# Patient Record
Sex: Female | Born: 1940 | Race: White | Hispanic: No | Smoking: Former smoker
Health system: Southern US, Community
[De-identification: ages and names within clinical notes are randomized; demographics above are authoritative.]

## PROBLEM LIST (undated history)

## (undated) DIAGNOSIS — I509 Heart failure, unspecified: Secondary | ICD-10-CM

## (undated) DIAGNOSIS — M4800 Spinal stenosis, site unspecified: Secondary | ICD-10-CM

## (undated) DIAGNOSIS — M810 Age-related osteoporosis without current pathological fracture: Secondary | ICD-10-CM

## (undated) DIAGNOSIS — M1990 Unspecified osteoarthritis, unspecified site: Secondary | ICD-10-CM

## (undated) DIAGNOSIS — H269 Unspecified cataract: Secondary | ICD-10-CM

## (undated) DIAGNOSIS — J45909 Unspecified asthma, uncomplicated: Secondary | ICD-10-CM

## (undated) DIAGNOSIS — C801 Malignant (primary) neoplasm, unspecified: Secondary | ICD-10-CM

## (undated) DIAGNOSIS — I10 Essential (primary) hypertension: Secondary | ICD-10-CM

## (undated) DIAGNOSIS — T7840XA Allergy, unspecified, initial encounter: Secondary | ICD-10-CM

## (undated) DIAGNOSIS — K219 Gastro-esophageal reflux disease without esophagitis: Secondary | ICD-10-CM

## (undated) DIAGNOSIS — E785 Hyperlipidemia, unspecified: Secondary | ICD-10-CM

## (undated) DIAGNOSIS — E871 Hypo-osmolality and hyponatremia: Secondary | ICD-10-CM

## (undated) HISTORY — DX: Allergy, unspecified, initial encounter: T78.40XA

## (undated) HISTORY — DX: Unspecified cataract: H26.9

## (undated) HISTORY — DX: Age-related osteoporosis without current pathological fracture: M81.0

## (undated) HISTORY — DX: Unspecified osteoarthritis, unspecified site: M19.90

## (undated) HISTORY — PX: JOINT REPLACEMENT: SHX530

## (undated) HISTORY — DX: Gastro-esophageal reflux disease without esophagitis: K21.9

## (undated) HISTORY — DX: Spinal stenosis, site unspecified: M48.00

## (undated) HISTORY — PX: HERNIA REPAIR: SHX51

## (undated) HISTORY — DX: Unspecified asthma, uncomplicated: J45.909

---

## 2015-09-21 HISTORY — PX: JOINT REPLACEMENT: SHX530

## 2016-08-20 HISTORY — PX: FEMUR FRACTURE SURGERY: SHX633

## 2018-04-19 DIAGNOSIS — N281 Cyst of kidney, acquired: Secondary | ICD-10-CM | POA: Insufficient documentation

## 2018-05-03 ENCOUNTER — Encounter: Payer: Self-pay | Admitting: Emergency Medicine

## 2018-05-03 ENCOUNTER — Other Ambulatory Visit: Payer: Self-pay

## 2018-05-03 ENCOUNTER — Inpatient Hospital Stay
Admission: EM | Admit: 2018-05-03 | Discharge: 2018-05-06 | DRG: 872 | Disposition: A | Discharge status: Home / Self Care | Payer: Medicare Other | Attending: Internal Medicine | Admitting: Internal Medicine

## 2018-05-03 DIAGNOSIS — N179 Acute kidney failure, unspecified: Secondary | ICD-10-CM | POA: Diagnosis present

## 2018-05-03 DIAGNOSIS — I110 Hypertensive heart disease with heart failure: Secondary | ICD-10-CM | POA: Diagnosis present

## 2018-05-03 DIAGNOSIS — N390 Urinary tract infection, site not specified: Secondary | ICD-10-CM | POA: Diagnosis not present

## 2018-05-03 DIAGNOSIS — Z66 Do not resuscitate: Secondary | ICD-10-CM | POA: Diagnosis present

## 2018-05-03 DIAGNOSIS — Z7982 Long term (current) use of aspirin: Secondary | ICD-10-CM

## 2018-05-03 DIAGNOSIS — Z79899 Other long term (current) drug therapy: Secondary | ICD-10-CM | POA: Diagnosis not present

## 2018-05-03 DIAGNOSIS — I5032 Chronic diastolic (congestive) heart failure: Secondary | ICD-10-CM | POA: Diagnosis present

## 2018-05-03 DIAGNOSIS — E876 Hypokalemia: Secondary | ICD-10-CM | POA: Diagnosis present

## 2018-05-03 DIAGNOSIS — A4151 Sepsis due to Escherichia coli [E. coli]: Principal | ICD-10-CM | POA: Diagnosis present

## 2018-05-03 DIAGNOSIS — N281 Cyst of kidney, acquired: Secondary | ICD-10-CM | POA: Diagnosis present

## 2018-05-03 DIAGNOSIS — Z96653 Presence of artificial knee joint, bilateral: Secondary | ICD-10-CM | POA: Diagnosis present

## 2018-05-03 DIAGNOSIS — E785 Hyperlipidemia, unspecified: Secondary | ICD-10-CM | POA: Diagnosis present

## 2018-05-03 DIAGNOSIS — A419 Sepsis, unspecified organism: Secondary | ICD-10-CM

## 2018-05-03 HISTORY — DX: Hypo-osmolality and hyponatremia: E87.1

## 2018-05-03 HISTORY — DX: Hyperlipidemia, unspecified: E78.5

## 2018-05-03 HISTORY — DX: Heart failure, unspecified: I50.9

## 2018-05-03 HISTORY — DX: Malignant (primary) neoplasm, unspecified: C80.1

## 2018-05-03 HISTORY — DX: Essential (primary) hypertension: I10

## 2018-05-03 LAB — CBC WITH DIFFERENTIAL/PLATELET
Basophils Absolute: 0 10*3/uL (ref 0–0.1)
Basophils Relative: 0 %
Eosinophils Absolute: 0 10*3/uL (ref 0–0.7)
Eosinophils Relative: 0 %
HEMATOCRIT: 29.4 % — AB (ref 35.0–47.0)
Hemoglobin: 9.9 g/dL — ABNORMAL LOW (ref 12.0–16.0)
LYMPHS ABS: 0.9 10*3/uL — AB (ref 1.0–3.6)
LYMPHS PCT: 8 %
MCH: 29.7 pg (ref 26.0–34.0)
MCHC: 33.6 g/dL (ref 32.0–36.0)
MCV: 88.4 fL (ref 80.0–100.0)
MONO ABS: 1.1 10*3/uL — AB (ref 0.2–0.9)
MONOS PCT: 10 %
NEUTROS ABS: 9 10*3/uL — AB (ref 1.4–6.5)
NEUTROS PCT: 82 %
Platelets: 336 10*3/uL (ref 150–440)
RBC: 3.32 MIL/uL — ABNORMAL LOW (ref 3.80–5.20)
RDW: 13.9 % (ref 11.5–14.5)
WBC: 11 10*3/uL (ref 3.6–11.0)

## 2018-05-03 LAB — COMPREHENSIVE METABOLIC PANEL
ALBUMIN: 3.7 g/dL (ref 3.5–5.0)
ALK PHOS: 94 U/L (ref 38–126)
ALT: 12 U/L (ref 0–44)
ANION GAP: 12 (ref 5–15)
AST: 18 U/L (ref 15–41)
BUN: 18 mg/dL (ref 8–23)
CALCIUM: 8.2 mg/dL — AB (ref 8.9–10.3)
CHLORIDE: 98 mmol/L (ref 98–111)
CO2: 23 mmol/L (ref 22–32)
Creatinine, Ser: 1.29 mg/dL — ABNORMAL HIGH (ref 0.44–1.00)
GFR calc non Af Amer: 38 mL/min — ABNORMAL LOW (ref >60)
GFR, EST AFRICAN AMERICAN: 44 mL/min — AB (ref >60)
GLUCOSE: 126 mg/dL — AB (ref 70–99)
POTASSIUM: 3.2 mmol/L — AB (ref 3.5–5.1)
SODIUM: 133 mmol/L — AB (ref 135–145)
Total Bilirubin: 0.8 mg/dL (ref 0.3–1.2)
Total Protein: 7 g/dL (ref 6.5–8.1)

## 2018-05-03 LAB — URINALYSIS, COMPLETE (UACMP) WITH MICROSCOPIC
Bilirubin Urine: NEGATIVE
GLUCOSE, UA: NEGATIVE mg/dL
KETONES UR: NEGATIVE mg/dL
NITRITE: NEGATIVE
PROTEIN: 30 mg/dL — AB
Specific Gravity, Urine: 1.015 (ref 1.005–1.030)
WBC, UA: >50 WBC/hpf — ABNORMAL HIGH (ref 0–5)
pH: 5 (ref 5.0–8.0)

## 2018-05-03 LAB — LACTIC ACID, PLASMA: LACTIC ACID, VENOUS: 1.2 mmol/L (ref 0.5–1.9)

## 2018-05-03 MED ORDER — CIPROFLOXACIN IN D5W 200 MG/100ML IV SOLN: 200.0000 mg | Freq: Two times a day (BID) | INTRAVENOUS | Status: DC

## 2018-05-03 MED ORDER — SODIUM CHLORIDE 0.9 % IV BOLUS
1000.0000 mL | Freq: Once | INTRAVENOUS | Status: AC
  Administered 2018-05-03: 1000 mL via INTRAVENOUS

## 2018-05-03 MED ORDER — ATORVASTATIN CALCIUM 20 MG PO TABS
10.0000 mg | ORAL_TABLET | Freq: Every day | ORAL | Status: DC
  Administered 2018-05-03 – 2018-05-05 (×3): 10 mg via ORAL
  Filled 2018-05-03 (×3): qty 1

## 2018-05-03 MED ORDER — SODIUM CHLORIDE 0.9 % IV SOLN
INTRAVENOUS | Status: DC
  Administered 2018-05-03: 21:00:00 via INTRAVENOUS

## 2018-05-03 MED ORDER — DOCUSATE SODIUM 100 MG PO CAPS: 100.0000 mg | ORAL_CAPSULE | Freq: Two times a day (BID) | ORAL | Status: DC | PRN

## 2018-05-03 MED ORDER — IRBESARTAN 150 MG PO TABS
150.0000 mg | ORAL_TABLET | Freq: Every day | ORAL | Status: DC
  Administered 2018-05-03 – 2018-05-06 (×4): 150 mg via ORAL
  Filled 2018-05-03 (×4): qty 1

## 2018-05-03 MED ORDER — FUROSEMIDE 40 MG PO TABS: 20.0000 mg | ORAL_TABLET | Freq: Every day | ORAL | Status: DC

## 2018-05-03 MED ORDER — HEPARIN SODIUM (PORCINE) 5000 UNIT/ML IJ SOLN
5000.0000 [IU] | Freq: Three times a day (TID) | INTRAMUSCULAR | Status: DC
  Administered 2018-05-03 – 2018-05-04 (×2): 5000 [IU] via SUBCUTANEOUS
  Filled 2018-05-03 (×2): qty 1

## 2018-05-03 MED ORDER — ASPIRIN EC 81 MG PO TBEC
81.0000 mg | DELAYED_RELEASE_TABLET | Freq: Every day | ORAL | Status: DC
  Administered 2018-05-03: 22:00:00 81 mg via ORAL
  Filled 2018-05-03 (×2): qty 1

## 2018-05-03 MED ORDER — CIPROFLOXACIN HCL 500 MG PO TABS
500.0000 mg | ORAL_TABLET | Freq: Once | ORAL | Status: AC
  Administered 2018-05-03: 500 mg via ORAL

## 2018-05-03 MED ORDER — POTASSIUM CHLORIDE CRYS ER 20 MEQ PO TBCR
20.0000 meq | EXTENDED_RELEASE_TABLET | Freq: Two times a day (BID) | ORAL | Status: DC
  Administered 2018-05-03 – 2018-05-06 (×6): 20 meq via ORAL
  Filled 2018-05-03 (×6): qty 1

## 2018-05-03 MED ORDER — ESCITALOPRAM OXALATE 10 MG PO TABS
10.0000 mg | ORAL_TABLET | ORAL | Status: DC
  Administered 2018-05-04 – 2018-05-06 (×3): 10 mg via ORAL
  Filled 2018-05-03 (×3): qty 1

## 2018-05-03 MED ORDER — CEFTRIAXONE SODIUM 1 G IJ SOLR
1.0000 g | Freq: Once | INTRAMUSCULAR | Status: AC
  Administered 2018-05-03: 1 g via INTRAVENOUS

## 2018-05-03 MED ORDER — SODIUM CHLORIDE 0.9 % IV SOLN
INTRAVENOUS | Status: AC
  Administered 2018-05-03: 1 g via INTRAVENOUS
  Filled 2018-05-03: qty 10

## 2018-05-03 MED ORDER — METOPROLOL TARTRATE 50 MG PO TABS
50.0000 mg | ORAL_TABLET | Freq: Two times a day (BID) | ORAL | Status: DC
  Administered 2018-05-03 – 2018-05-06 (×6): 50 mg via ORAL
  Filled 2018-05-03 (×6): qty 1

## 2018-05-03 MED ORDER — POTASSIUM CHLORIDE CRYS ER 20 MEQ PO TBCR
EXTENDED_RELEASE_TABLET | ORAL | Status: AC
  Filled 2018-05-03: qty 2

## 2018-05-03 MED ORDER — ACETAMINOPHEN 325 MG PO TABS
650.0000 mg | ORAL_TABLET | Freq: Four times a day (QID) | ORAL | Status: DC | PRN
  Administered 2018-05-03 – 2018-05-05 (×5): 650 mg via ORAL
  Filled 2018-05-03 (×5): qty 2

## 2018-05-03 MED ORDER — CIPROFLOXACIN HCL 500 MG PO TABS
ORAL_TABLET | ORAL | Status: AC
  Administered 2018-05-03: 500 mg via ORAL
  Filled 2018-05-03: qty 1

## 2018-05-03 MED ORDER — SODIUM CHLORIDE 1 G PO TABS
1.0000 g | ORAL_TABLET | Freq: Two times a day (BID) | ORAL | Status: DC
  Administered 2018-05-03 – 2018-05-06 (×6): 1 g via ORAL
  Filled 2018-05-03 (×7): qty 1

## 2018-05-03 MED ORDER — ONDANSETRON HCL 4 MG/2ML IJ SOLN
4.0000 mg | Freq: Once | INTRAMUSCULAR | Status: AC
  Administered 2018-05-03: 4 mg via INTRAVENOUS
  Filled 2018-05-03: qty 2

## 2018-05-03 MED ORDER — SODIUM CHLORIDE 0.9 % IV BOLUS
500.0000 mL | Freq: Once | INTRAVENOUS | Status: AC
  Administered 2018-05-03: 500 mL via INTRAVENOUS

## 2018-05-03 MED ORDER — MIRABEGRON ER 50 MG PO TB24
50.0000 mg | ORAL_TABLET | Freq: Every day | ORAL | Status: DC
  Administered 2018-05-03 – 2018-05-06 (×4): 50 mg via ORAL
  Filled 2018-05-03 (×4): qty 1

## 2018-05-03 MED ORDER — CIPROFLOXACIN HCL 250 MG PO TABS: 250.0000 mg | ORAL_TABLET | Freq: Two times a day (BID) | ORAL | 0 refills | Status: DC

## 2018-05-03 MED ORDER — CIPROFLOXACIN IN D5W 400 MG/200ML IV SOLN
400.0000 mg | Freq: Once | INTRAVENOUS | Status: DC
  Filled 2018-05-03: qty 200

## 2018-05-03 MED ORDER — POTASSIUM CHLORIDE CRYS ER 20 MEQ PO TBCR: 40.0000 meq | EXTENDED_RELEASE_TABLET | Freq: Once | ORAL | Status: DC

## 2018-05-03 MED ORDER — ONDANSETRON HCL 4 MG PO TABS: 4.0000 mg | ORAL_TABLET | Freq: Every day | ORAL | 1 refills | Status: AC | PRN

## 2018-05-03 MED ORDER — CIPROFLOXACIN IN D5W 200 MG/100ML IV SOLN
200.0000 mg | Freq: Two times a day (BID) | INTRAVENOUS | Status: DC
  Administered 2018-05-03: 200 mg via INTRAVENOUS
  Filled 2018-05-03 (×2): qty 100

## 2018-05-03 MED ORDER — PANTOPRAZOLE SODIUM 40 MG PO TBEC
40.0000 mg | DELAYED_RELEASE_TABLET | Freq: Every day | ORAL | Status: DC
  Administered 2018-05-04 – 2018-05-06 (×3): 40 mg via ORAL
  Filled 2018-05-03 (×3): qty 1

## 2018-05-03 NOTE — ED Notes (Signed)
Vanessa RN, aware of bed assigned  

## 2018-05-03 NOTE — Progress Notes (Signed)
CODE SEPSIS - PHARMACY COMMUNICATION  **Broad Spectrum Antibiotics should be administered within 1 hour of Sepsis diagnosis**  Time Code Sepsis Called/Page Received: 1443  Antibiotics Ordered: Ceftriaxone  Time of 1st antibiotic administration: 1528  Additional action taken by pharmacy:    If necessary, Name of Provider/Nurse Contacted:      Noralee Space ,PharmD Clinical Pharmacist  05/03/2018  4:35 PM

## 2018-05-03 NOTE — ED Notes (Signed)
First RN note:  Patient sent by [facility] urgent care for possible UTI.  Patient was febrile this morning and was recently released from the hospital for hyponatremia.

## 2018-05-03 NOTE — H&P (Signed)
[facility] NAME: Julie Lutz    MR#:  [id number]  DATE OF BIRTH:  [birth year]  DATE OF ADMISSION:  05/03/2018  PRIMARY CARE PHYSICIAN: System, Pcp Not In   REQUESTING/REFERRING PHYSICIAN: Quale   CHIEF COMPLAINT:   Chief Complaint  Patient presents with  . Urinary Tract Infection  . Fever    HISTORY OF PRESENT ILLNESS: Julie Lutz  is a 77 y.o. female with a known history of vocal cord cancer, CHF, hyperlipidemia, hypertension, hyponatremia-was admitted in [place] for UTI and hyponatremia for 5 days in hospital.  That was 3 weeks ago, she was discharged with 3 days course of oral Keflex.  She was feeling fine since then until yesterday.  She is visiting her daughters here in [place], flew in yesterday and also had some chills on the flight.  She also had some fever and chills today morning with some burning urinary symptoms and some cloudy urine so decided to come to emergency room. Noted to have UTI again and she was slightly tachycardic and have elevated white blood cell count so ER physician suggested to admit to hospitalist team.  PAST MEDICAL HISTORY:   Past Medical History:  Diagnosis Date  . Cancer ([place])    vocal cord  . CHF (congestive heart failure) ([place])   . Hyperlipidemia   . Hypertension   . Hyponatremia     PAST SURGICAL HISTORY:  Past Surgical History:  Procedure Laterality Date  . HERNIA REPAIR    . JOINT REPLACEMENT     bilateral knee    SOCIAL HISTORY:  Social History   Tobacco Use  . Smoking status: Never Smoker  . Smokeless tobacco: Never Used  Substance Use Topics  . Alcohol use: Yes    Comment: occasional    FAMILY HISTORY:  Family History  Problem Relation Age of Onset  . Anemia Sister     DRUG ALLERGIES: No Known Allergies  REVIEW OF SYSTEMS:   CONSTITUTIONAL: Have fever, fatigue or weakness.  EYES: No blurred or double vision.  EARS, NOSE, AND THROAT: No tinnitus or  ear pain.  RESPIRATORY: No cough, shortness of breath, wheezing or hemoptysis.  CARDIOVASCULAR: No chest pain, orthopnea, edema.  GASTROINTESTINAL: No nausea, vomiting, diarrhea or abdominal pain.  GENITOURINARY: No dysuria, hematuria.  ENDOCRINE: No polyuria, nocturia,  HEMATOLOGY: No anemia, easy bruising or bleeding. SKIN: No rash or lesion. MUSCULOSKELETAL: No joint pain or arthritis.   NEUROLOGIC: No tingling, numbness, weakness.  PSYCHIATRY: No anxiety or depression.   MEDICATIONS AT HOME:  Prior to Admission medications   Medication Sig Start Date End Date Taking? Authorizing Provider  aspirin EC 81 MG tablet Take 81 mg by mouth daily.   Yes [provider]  atorvastatin (LIPITOR) 10 MG tablet Take 10 mg by mouth at bedtime.   Yes [provider]  escitalopram (LEXAPRO) 10 MG tablet Take 10 mg by mouth every morning.   Yes [provider]  furosemide (LASIX) 20 MG tablet Take 20 mg by mouth daily.   Yes [provider]  irbesartan (AVAPRO) 150 MG tablet Take 150 mg by mouth daily.   Yes [provider]  lansoprazole (PREVACID) 30 MG capsule Take 30 mg by mouth daily.   Yes [provider]  MAGNESIUM CITRATE PO Take 1 tablet by mouth daily.   Yes [provider]  metoprolol tartrate (LOPRESSOR) 50 MG tablet Take 50 mg by mouth 2 (two) times daily.  Yes [provider]  mirabegron ER (MYRBETRIQ) 50 MG TB24 tablet Take 50 mg by mouth daily.   Yes [provider]  sodium chloride 1 g tablet Take 1 g by mouth 2 (two) times daily.   Yes [provider]  ciprofloxacin (CIPRO) 250 MG tablet Take 1 tablet (250 mg total) by mouth 2 (two) times daily for 7 days. 05/03/18 05/10/18  Delman Kitten, MD  ondansetron (ZOFRAN) 4 MG tablet Take 1 tablet (4 mg total) by mouth daily as needed for nausea or vomiting. 05/03/18 05/03/19  Delman Kitten, MD      PHYSICAL EXAMINATION:   VITAL SIGNS: Blood pressure (!)  142/94, pulse (!) 109, temperature 98.9 F (37.2 C), temperature source Oral, resp. rate 18, height 4\' 10"  (1.473 m), weight 63.5 kg, SpO2 96 %.  GENERAL:  77 y.o.-year-old patient lying in the bed with no acute distress.  EYES: Pupils equal, round, reactive to light and accommodation. No scleral icterus. Extraocular muscles intact.  HEENT: Head atraumatic, normocephalic. Oropharynx and nasopharynx clear.  NECK:  Supple, no jugular venous distention. No thyroid enlargement, no tenderness.  LUNGS: Normal breath sounds bilaterally, no wheezing, rales,rhonchi or crepitation. No use of accessory muscles of respiration.  CARDIOVASCULAR: S1, S2 tachycardia. No murmurs, rubs, or gallops.  ABDOMEN: Soft, nontender, nondistended. Bowel sounds present. No organomegaly or mass.  EXTREMITIES: No pedal edema, cyanosis, or clubbing.  NEUROLOGIC: Cranial nerves II through XII are intact. Muscle strength 5/5 in all extremities. Sensation intact. Gait not checked.  PSYCHIATRIC: The patient is alert and oriented x 3.  SKIN: No obvious rash, lesion, or ulcer.   LABORATORY PANEL:   CBC Recent Labs  Lab 05/03/18 1327  WBC 11.0  HGB 9.9*  HCT 29.4*  PLT 336  MCV 88.4  MCH 29.7  MCHC 33.6  RDW 13.9  LYMPHSABS 0.9*  MONOABS 1.1*  EOSABS 0.0  BASOSABS 0.0   ------------------------------------------------------------------------------------------------------------------  Chemistries  Recent Labs  Lab 05/03/18 1327  NA 133*  K 3.2*  CL 98  CO2 23  GLUCOSE 126*  BUN 18  CREATININE 1.29*  CALCIUM 8.2*  AST 18  ALT 12  ALKPHOS 94  BILITOT 0.8   ------------------------------------------------------------------------------------------------------------------ estimated creatinine clearance is 27.9 mL/min (A) (by C-G formula based on SCr of 1.29 mg/dL (H)). ------------------------------------------------------------------------------------------------------------------ No results for  input(s): TSH, T4TOTAL, T3FREE, THYROIDAB in the last 72 hours.  Invalid input(s): FREET3   Coagulation profile No results for input(s): INR, PROTIME in the last 168 hours. ------------------------------------------------------------------------------------------------------------------- No results for input(s): DDIMER in the last 72 hours. -------------------------------------------------------------------------------------------------------------------  Cardiac Enzymes No results for input(s): CKMB, TROPONINI, MYOGLOBIN in the last 168 hours.  Invalid input(s): CK ------------------------------------------------------------------------------------------------------------------ Invalid input(s): POCBNP  ---------------------------------------------------------------------------------------------------------------  Urinalysis    Component Value Date/Time   COLORURINE AMBER (A) 05/03/2018 1327   APPEARANCEUR TURBID (A) 05/03/2018 1327   LABSPEC 1.015 05/03/2018 1327   PHURINE 5.0 05/03/2018 1327   GLUCOSEU NEGATIVE 05/03/2018 1327   HGBUR MODERATE (A) 05/03/2018 1327   BILIRUBINUR NEGATIVE 05/03/2018 1327   KETONESUR NEGATIVE 05/03/2018 1327   PROTEINUR 30 (A) 05/03/2018 1327   NITRITE NEGATIVE 05/03/2018 1327   LEUKOCYTESUR LARGE (A) 05/03/2018 1327     RADIOLOGY: No results found.  EKG: Orders placed or performed during the hospital encounter of 05/03/18  . ED EKG 12-Lead  . ED EKG 12-Lead  . EKG 12-Lead  . EKG 12-Lead    IMPRESSION AND PLAN:  *Sepsis due to UTI IV fluid and IV antibiotics. She  was treated 3 weeks ago with cephalosporins, so I would like to treat with ciprofloxacin as advised by ID to ER physician. Cultures are sent we would like to follow the results. Also due to recurrent UTI will get ultrasound renal.  *Hypokalemia Replace oral.  *Acute renal failure We will give IV fluid and monitor renal function, we do not have previous result to  compare with.  *Hypertension Continue medications.  *Chronic diastolic congestive heart failure Patient is on diuretics, I will hold that for now.  *Hyperlipidemia Continue atorvastatin.  All the records are reviewed and case discussed with ED provider. Management plans discussed with the patient, family and they are in agreement.  CODE STATUS: DNR Advance Directive Documentation     Most Recent Value  Type of Advance Directive  Healthcare Power of Attorney, Living will  Pre-existing out of facility DNR order (yellow form or pink MOST form)  -  "MOST" Form in Place?  -       TOTAL TIME TAKING CARE OF THIS PATIENT: 45 minutes.    Vaughan Basta M.D on 05/03/2018   Between 7am to 6pm - Pager - [phone]  After 6pm go to [email or web address] - password [email or web address] [facility]  Office  [phone]  CC: Primary care physician; System, Pcp Not In   Note: This dictation was prepared with Dragon dictation along with smaller phrase technology. Any transcriptional errors that result from this process are unintentional.

## 2018-05-03 NOTE — Progress Notes (Signed)
Family Meeting Note  Advance Directive:yes  Today a meeting took place with the Patient and daughters.  The following clinical team members were present during this meeting:MD  The following were discussed:Patient's diagnosis: UTI, Htn, CHF, Hyponatremia, Hyperlipidemia, Patient's progosis: Unable to determine and Goals for treatment: DNR  Her niece, who lives in [place] is her healthcare power of attorney and patient confirms that in case of her heart stops, she would like to be DNR.  Additional follow-up to be provided: PMD Time spent during discussion:20 minutes  Julie Basta, MD

## 2018-05-03 NOTE — ED Provider Notes (Signed)
[facility] Emergency Department Provider Note ____________________________________________   First MD Initiated Contact with Patient 05/03/18 1502     (approximate)  I have reviewed the triage vital signs and the nursing notes.   HISTORY  Chief Complaint Urinary Tract Infection and Fever  HPI Julie Lutz is a 77 y.o. female history of vocal cord cancer, hypertension low-sodium, and also history of some mild congestive heart failure in the past but currently reports she is taking salt tablets and only using Lasix for the purposes of raising her sodium because she was hospitalized with a low sodium about 3 weeks ago.  She also a urinary tract infection was treated with vancomycin and Rocephin as an inpatient in [place].  About 3 days ago she began reexperiencing symptoms of fatigue, cloudiness of the urine and some uncomfortable feeling with urination.  Since she is from [place] she had not been able to seek care yet but this morning developed a fever with 102 point Fahrenheit  She was referred here from urgent care for further evaluation.  She makes very known to me that she feels fairly well at moment and that she does not wish to be hospitalized but is okay getting fluids and antibiotics but reports she will be going home today with the family with antibiotics and does not want to be hospitalized at present time.  Past Medical History:  Diagnosis Date  . Cancer ([place])    vocal cord  . CHF (congestive heart failure) ([place])   . Hyperlipidemia   . Hypertension   . Hyponatremia     There are no active problems to display for this patient.   Past Surgical History:  Procedure Laterality Date  . HERNIA REPAIR    . JOINT REPLACEMENT     bilateral knee    Prior to Admission medications   Medication Sig Start Date End Date Taking? Authorizing Provider  ciprofloxacin (CIPRO) 250 MG tablet Take 1 tablet (250 mg total) by mouth 2 (two) times daily for 7  days. 05/03/18 05/10/18  Delman Kitten, MD  ondansetron (ZOFRAN) 4 MG tablet Take 1 tablet (4 mg total) by mouth daily as needed for nausea or vomiting. 05/03/18 05/03/19  Delman Kitten, MD    Allergies Patient has no known allergies.  No family history on file.  Social History Social History   Tobacco Use  . Smoking status: Never Smoker  . Smokeless tobacco: Never Used  Substance Use Topics  . Alcohol use: Yes    Comment: occasional  . Drug use: Never    Review of Systems Constitutional: Fevers chills and fatigue eyes: No visual changes. ENT: No sore throat. Cardiovascular: Denies chest pain. Respiratory: Denies shortness of breath. Gastrointestinal: No abdominal pain.  No nausea, no vomiting.  No diarrhea.  No constipation but decreased appetite for about 2 days.  Decreased oral intake.  Genitourinary: See HPI musculoskeletal: Negative for back pain. Skin: Negative for rash. Neurological: Negative for headaches, focal weakness or numbness.    ____________________________________________   PHYSICAL EXAM:  VITAL SIGNS: ED Triage Vitals  Enc Vitals Group     BP 05/03/18 1300 99/60     Pulse Rate 05/03/18 1300 (!) 103     Resp 05/03/18 1300 20     Temp 05/03/18 1300 98.9 F (37.2 C)     Temp Source 05/03/18 1300 Oral     SpO2 05/03/18 1300 96 %     Weight 05/03/18 1302 140 lb (63.5 kg)  Height 05/03/18 1302 4\' 10"  (1.473 m)     Head Circumference --      Peak Flow --      Pain Score 05/03/18 1302 0     Pain Loc --      Pain Edu? --      Excl. in [place]? --     Constitutional: Alert and oriented. Well appearing and in no acute distress.  She and her 2 daughters both very pleasant.  Alert pleasant very conversant. Eyes: Conjunctivae are normal. Head: Atraumatic. Nose: No congestion/rhinnorhea. Mouth/Throat: Mucous membranes are lightly dry. Neck: No stridor.   Cardiovascular: Normal rate, regular rhythm. Grossly normal heart sounds.  Good peripheral  circulation. Respiratory: Normal respiratory effort.  No retractions. Lungs CTAB. Gastrointestinal: Soft and nontender. No distention. Musculoskeletal: No lower extremity tenderness nor edema. Neurologic:  Normal speech and language. No gross focal neurologic deficits are appreciated.  Skin:  Skin is warm, dry and intact. No rash noted. Psychiatric: Mood and affect are normal. Speech and behavior are normal.  ____________________________________________   LABS (all labs ordered are listed, but only abnormal results are displayed)  Labs Reviewed  COMPREHENSIVE METABOLIC PANEL - Abnormal; Notable for the following components:      Result Value   Sodium 133 (*)    Potassium 3.2 (*)    Glucose, Bld 126 (*)    Creatinine, Ser 1.29 (*)    Calcium 8.2 (*)    GFR calc non Af Amer 38 (*)    GFR calc Af Amer 44 (*)    All other components within normal limits  CBC WITH DIFFERENTIAL/PLATELET - Abnormal; Notable for the following components:   RBC 3.32 (*)    Hemoglobin 9.9 (*)    HCT 29.4 (*)    Neutro Abs 9.0 (*)    Lymphs Abs 0.9 (*)    Monocytes Absolute 1.1 (*)    All other components within normal limits  URINALYSIS, COMPLETE (UACMP) WITH MICROSCOPIC - Abnormal; Notable for the following components:   Color, Urine AMBER (*)    APPearance TURBID (*)    Hgb urine dipstick MODERATE (*)    Protein, ur 30 (*)    Leukocytes, UA LARGE (*)    WBC, UA >50 (*)    Bacteria, UA RARE (*)    All other components within normal limits  CULTURE, BLOOD (ROUTINE X 2)  CULTURE, BLOOD (ROUTINE X 2)  URINE CULTURE  LACTIC ACID, PLASMA   ____________________________________________  EKG  Reviewed and interpreted by me at 1520 Heart rate 90 QRS 80 QTc 440 No evidence of acute ischemia, single PVC ____________________________________________  RADIOLOGY   ____________________________________________   PROCEDURES  Procedure(s) performed: None  Procedures  Critical Care performed:  No  ____________________________________________   INITIAL IMPRESSION / ASSESSMENT AND PLAN / ED COURSE  Pertinent labs & imaging results that were available during my care of the patient were reviewed by me and considered in my medical decision making (see chart for details).  Patient presents for evaluation of fever fatigue chills and dysuria.  Recently hospitalized for treatment of urinary tract infection hyponatremia where she reports her sodium was about 120s.  Sodium improved today, and discussed with an attempt to obtain records from hospital in [place], but unable to obtain them at the present time.  Patient appears to have infection, urinary tract infection by labs and clinical history.  Some very slight hypotension and some very minimal tachycardia at present.  She reports she feels quite  a bit better now after she took Tylenol at urgent care and her fevers come down.  She does not appear clinically toxic, discussed with infectious disease Dr. Delaine Lame based on her previous treatment we will start her on ciprofloxacin oral as an outpatient with a plan for close follow-up in 48 hours with [facility] urgent care.  Patient is agreeable with this plan, plan to hydrate her and reassess though prior to any decision for discharge.  ----------------------------------------- 6:38 PM on 05/03/2018 -----------------------------------------  Vitals:   05/03/18 1535 05/03/18 1729  BP: (!) 138/54 (!) 142/94  Pulse: 90 (!) 109  Resp:  18  Temp:    SpO2: 96% 96%    Patient reassessed.  Still somewhat tachycardic.  Discussed with patient and her bed of family including 2 daughters at the bedside and strongly recommended to be admitted for additional care treatment of evidence of a severe infection and "sepsis" from her urinary tract infection.  Recommended treatment with IV antibiotics additional fluids and hospitalization, patient again reports that she does feel much better she is  going to go home with her family and will come back if she worsens.  After discussion of risks and benefits and shared medical decision making including her understanding that she has a severe infection that warrants hospitalization she is elected not to stay and wishes to be discharged at this time.  I will discharge her with a prescription for ciprofloxacin as recommended by infectious disease and plan to follow-up in her family and she will return to the urgent care Friday for reevaluation at [facility], return to the ER sooner if worsening symptoms or new concerns.  Although I recommended inpatient hospitalization, she appears to have the capacity and clearly understands my recommendation to be would like to be discharged instead.  She does not wish to stay for observation or ongoing ED care at this time.  We will discharge the patient, prescriptions for ciprofloxacin and Zofran.  We did discuss the risks of ciprofloxacin including tendon and joint rupture and injury, but after I discussed with infectious disease.  The most appropriate antibiotic at this time would be ciprofloxacin as advised by ID physician and the patient is understanding of this risk as well.  Return precautions and treatment recommendations and follow-up discussed with the patient who is agreeable with the plan.  He does improve improved at discharge.  She is nontoxic alert oriented ambulatory in no distress, some slight tachycardia ongoing at this time.      ____________________________________________   FINAL CLINICAL IMPRESSION(S) / ED DIAGNOSES  Final diagnoses:  Lower urinary tract infectious disease  Urinary tract infection, acute      NEW MEDICATIONS STARTED DURING THIS VISIT:  New Prescriptions   CIPROFLOXACIN (CIPRO) 250 MG TABLET    Take 1 tablet (250 mg total) by mouth 2 (two) times daily for 7 days.   ONDANSETRON (ZOFRAN) 4 MG TABLET    Take 1 tablet (4 mg total) by mouth daily as needed for nausea or  vomiting.     Note:  This document was prepared using Dragon voice recognition software and may include unintentional dictation errors.     Delman Kitten, MD 05/03/18 1840

## 2018-05-03 NOTE — ED Triage Notes (Addendum)
Patient to ED from [facility]. Patient reports that she has had fever and urinary frequency for 2-3 days. States that her urine has been cloudy as well. History of uti's. Patient also reports nausea. Denies vomiting. Patient had fever of 102.5 and HR of 120 at urgent care. Given 2 tylenol.

## 2018-05-03 NOTE — ED Notes (Signed)
Pt ambulated w/a steady gait. HR 128bpm walking; HR 119bpm at rest; BP 166/108; SpO2 100% RA.

## 2018-05-03 NOTE — ED Notes (Signed)
Pt assisted to the bathroom by her daughter.

## 2018-05-03 NOTE — ED Notes (Signed)
EDP at bedside at this time.  

## 2018-05-03 NOTE — Discharge Instructions (Signed)
You have been seen in the Emergency Department (ED) today for pain when urinating.  Your workup today suggests that you have a urinary tract infection (UTI).   Drink PLENTY of fluids.  Call your regular doctor to schedule the next available appointment to follow up on todays ED visit, or return immediately to the ED if your pain worsens, you have decreased urine production, develop fever, persistent vomiting, or other symptoms that concern you.

## 2018-05-04 ENCOUNTER — Inpatient Hospital Stay: Payer: Medicare Other

## 2018-05-04 LAB — CBC
HCT: 25.9 % — ABNORMAL LOW (ref 35.0–47.0)
HEMOGLOBIN: 9 g/dL — AB (ref 12.0–16.0)
MCH: 30.6 pg (ref 26.0–34.0)
MCHC: 34.7 g/dL (ref 32.0–36.0)
MCV: 88.3 fL (ref 80.0–100.0)
PLATELETS: 285 10*3/uL (ref 150–440)
RBC: 2.94 MIL/uL — AB (ref 3.80–5.20)
RDW: 14.1 % (ref 11.5–14.5)
WBC: 7.5 10*3/uL (ref 3.6–11.0)

## 2018-05-04 LAB — BLOOD CULTURE ID PANEL (REFLEXED)
Acinetobacter baumannii: NOT DETECTED
CANDIDA TROPICALIS: NOT DETECTED
CARBAPENEM RESISTANCE: NOT DETECTED
Candida albicans: NOT DETECTED
Candida glabrata: NOT DETECTED
Candida krusei: NOT DETECTED
Candida parapsilosis: NOT DETECTED
ENTEROCOCCUS SPECIES: NOT DETECTED
Enterobacter cloacae complex: NOT DETECTED
Enterobacteriaceae species: DETECTED — AB
Escherichia coli: DETECTED — AB
Haemophilus influenzae: NOT DETECTED
Klebsiella oxytoca: NOT DETECTED
Klebsiella pneumoniae: NOT DETECTED
LISTERIA MONOCYTOGENES: NOT DETECTED
Neisseria meningitidis: NOT DETECTED
PROTEUS SPECIES: NOT DETECTED
Pseudomonas aeruginosa: NOT DETECTED
STAPHYLOCOCCUS AUREUS BCID: NOT DETECTED
STAPHYLOCOCCUS SPECIES: NOT DETECTED
Serratia marcescens: NOT DETECTED
Streptococcus agalactiae: NOT DETECTED
Streptococcus pneumoniae: NOT DETECTED
Streptococcus pyogenes: NOT DETECTED
Streptococcus species: NOT DETECTED

## 2018-05-04 LAB — BASIC METABOLIC PANEL
ANION GAP: 9 (ref 5–15)
BUN: 15 mg/dL (ref 8–23)
CO2: 23 mmol/L (ref 22–32)
Calcium: 7.2 mg/dL — ABNORMAL LOW (ref 8.9–10.3)
Chloride: 104 mmol/L (ref 98–111)
Creatinine, Ser: 0.93 mg/dL (ref 0.44–1.00)
GFR, EST NON AFRICAN AMERICAN: 57 mL/min — AB (ref >60)
Glucose, Bld: 120 mg/dL — ABNORMAL HIGH (ref 70–99)
POTASSIUM: 3.5 mmol/L (ref 3.5–5.1)
Sodium: 136 mmol/L (ref 135–145)

## 2018-05-04 MED ORDER — MEROPENEM 1 G IV SOLR
1.0000 g | Freq: Two times a day (BID) | INTRAVENOUS | Status: DC
Start: 2018-05-04 — End: 2018-05-06
  Administered 2018-05-04 – 2018-05-05 (×4): 1 g via INTRAVENOUS
  Filled 2018-05-04 (×7): qty 1

## 2018-05-04 MED ORDER — ASPIRIN EC 81 MG PO TBEC
81.0000 mg | DELAYED_RELEASE_TABLET | Freq: Every day | ORAL | Status: DC
  Administered 2018-05-04 – 2018-05-05 (×2): 81 mg via ORAL
  Filled 2018-05-04 (×2): qty 1

## 2018-05-04 MED ORDER — ENOXAPARIN SODIUM 40 MG/0.4ML ~~LOC~~ SOLN
40.0000 mg | SUBCUTANEOUS | Status: DC
  Administered 2018-05-04 – 2018-05-05 (×2): 40 mg via SUBCUTANEOUS
  Filled 2018-05-04 (×2): qty 0.4

## 2018-05-04 MED ORDER — FUROSEMIDE 20 MG PO TABS
20.0000 mg | ORAL_TABLET | Freq: Every day | ORAL | Status: DC
  Administered 2018-05-04 – 2018-05-06 (×3): 20 mg via ORAL
  Filled 2018-05-04 (×3): qty 1

## 2018-05-04 NOTE — Progress Notes (Signed)
PHARMACY - PHYSICIAN COMMUNICATION CRITICAL VALUE ALERT - BLOOD CULTURE IDENTIFICATION (BCID)  Camika Marsico is an 77 y.o. female who presented to [facility] on 05/03/2018 with a chief complaint of UTI/fever  Assessment:  Tmax 102.8, now vitals WNL, UA leukocytes +, 1/4 GNR BCID Enterobacteriacaea E. Coli [facility] -  Name of physician (or Provider) Contacted: Arta Silence  Current antibiotics: ciprofloxacin  Changes to prescribed antibiotics recommended:  Recommendations accepted by provider Will switch to meropenem 1g IV q12h per CrCl 26 - 50 ml/min for possible ESBL E. Coli -- will de-escalate if cx/sx come back sensitive to other agents.  No results found for this or any previous visit.  Tobie Lords, PharmD, BCPS Clinical Pharmacist 05/04/2018

## 2018-05-04 NOTE — Progress Notes (Signed)
[place] [facility] [place] NAME: Zela Sobieski    MR#:  [id number]  DATE OF BIRTH:  January 06, 1941  SUBJECTIVE:  CHIEF COMPLAINT:   Chief Complaint  Patient presents with  . Urinary Tract Infection  . Fever   Has nausea and feels weak.  No trouble breathing or abdominal pain.  No dysuria.  REVIEW OF SYSTEMS:    Review of Systems  Constitutional: Positive for chills, fever and malaise/fatigue.  HENT: Negative for sore throat.   Eyes: Negative for blurred vision, double vision and pain.  Respiratory: Negative for cough, hemoptysis, shortness of breath and wheezing.   Cardiovascular: Negative for chest pain, palpitations, orthopnea and leg swelling.  Gastrointestinal: Positive for nausea. Negative for abdominal pain, constipation, diarrhea, heartburn and vomiting.  Genitourinary: Negative for dysuria and hematuria.  Musculoskeletal: Negative for back pain and joint pain.  Skin: Negative for rash.  Neurological: Negative for sensory change, speech change, focal weakness and headaches.  Endo/Heme/Allergies: Does not bruise/bleed easily.  Psychiatric/Behavioral: Negative for depression. The patient is not nervous/anxious.     DRUG ALLERGIES:  No Known Allergies  VITALS:  Blood pressure (!) 116/51, pulse 98, temperature 98.7 F (37.1 C), temperature source Oral, resp. rate 18, height 4\' 10"  (1.473 m), weight 65.1 kg, SpO2 98 %.  PHYSICAL EXAMINATION:   Physical Exam  GENERAL:  77 y.o.-year-old patient lying in the bed with no acute distress.  EYES: Pupils equal, round, reactive to light and accommodation. No scleral icterus. Extraocular muscles intact.  HEENT: Head atraumatic, normocephalic. Oropharynx and nasopharynx clear.  NECK:  Supple, no jugular venous distention. No thyroid enlargement, no tenderness.  LUNGS: Normal breath sounds bilaterally, no wheezing, rales, rhonchi. No use of accessory muscles of respiration.  CARDIOVASCULAR: S1, S2  normal. No murmurs, rubs, or gallops.  ABDOMEN: Soft, nontender, nondistended. Bowel sounds present. No organomegaly or mass.  EXTREMITIES: No cyanosis, clubbing or edema b/l.    NEUROLOGIC: Cranial nerves II through XII are intact. No focal Motor or sensory deficits b/l.   PSYCHIATRIC: The patient is alert and oriented x 3.  SKIN: No obvious rash, lesion, or ulcer.   LABORATORY PANEL:   CBC Recent Labs  Lab 05/04/18 0412  WBC 7.5  HGB 9.0*  HCT 25.9*  PLT 285   ------------------------------------------------------------------------------------------------------------------ Chemistries  Recent Labs  Lab 05/03/18 1327 05/04/18 0412  NA 133* 136  K 3.2* 3.5  CL 98 104  CO2 23 23  GLUCOSE 126* 120*  BUN 18 15  CREATININE 1.29* 0.93  CALCIUM 8.2* 7.2*  AST 18  --   ALT 12  --   ALKPHOS 94  --   BILITOT 0.8  --    ------------------------------------------------------------------------------------------------------------------  Cardiac Enzymes No results for input(s): TROPONINI in the last 168 hours. ------------------------------------------------------------------------------------------------------------------  RADIOLOGY:  US Renal  Result Date: 05/04/2018 CLINICAL DATA:  Urinary tract infection. EXAM: RENAL / URINARY TRACT ULTRASOUND COMPLETE COMPARISON:  None. FINDINGS: Right Kidney: Length: 10.1 cm. Echogenicity within normal limits. No mass or hydronephrosis visualized. Left Kidney: Length: 10.1 cm. 2.9 cm simple cyst is seen in upper pole. Echogenicity within normal limits. No mass or hydronephrosis visualized. Bladder: Appears normal for degree of bladder distention. IMPRESSION: No significant renal abnormality is noted. Electronically Signed   By: Marijo Conception, M.D.   On: 05/04/2018 09:40     ASSESSMENT AND PLAN:   *Sepsis due to UTI and E. coli bacteremia Change IV antibiotics to meropenem. Wait for final culture  and sensitivities.  Will need total 14  days of antibiotics once afebrile and normal WBC.  *Hypokalemia Replaced  *Acute kidney injury has resolved.  Renal ultrasound shows nothing acute.  *Hypertension Continue medications.   *Chronic diastolic congestive heart failure Continue oral Lasix from home.  *Hyperlipidemia Continue atorvastatin.  All the records are reviewed and case discussed with Care Management/Social Worker Management plans discussed with the patient, family and they are in agreement.  CODE STATUS: FULL CODE  DVT Prophylaxis: SCDs  TOTAL TIME TAKING CARE OF THIS PATIENT: 30 minutes.   POSSIBLE D/C IN 2-3 DAYS, DEPENDING ON CLINICAL CONDITION.  Neita Carp M.D on 05/04/2018 at 11:06 AM  Between 7am to 6pm - Pager - [phone]  After 6pm go to [email or web address] - password [email or web address] [facility]  Office  [phone]  CC: Primary care physician; System, Pcp Not In  Note: This dictation was prepared with Dragon dictation along with smaller phrase technology. Any transcriptional errors that result from this process are unintentional.

## 2018-05-05 LAB — URINE CULTURE: Special Requests: NORMAL

## 2018-05-05 NOTE — Progress Notes (Signed)
[place] at [place] NAME: Julie Lutz    MR#:  [id number]  DATE OF BIRTH:  Jan 30, 1941  SUBJECTIVE:  CHIEF COMPLAINT:   Chief Complaint  Patient presents with  . Urinary Tract Infection  . Fever   Fever 101 overnight. Afebrile today.  REVIEW OF SYSTEMS:    Review of Systems  Constitutional: Positive for chills, fever and malaise/fatigue.  HENT: Negative for sore throat.   Eyes: Negative for blurred vision, double vision and pain.  Respiratory: Negative for cough, hemoptysis, shortness of breath and wheezing.   Cardiovascular: Negative for chest pain, palpitations, orthopnea and leg swelling.  Gastrointestinal: Positive for nausea. Negative for abdominal pain, constipation, diarrhea, heartburn and vomiting.  Genitourinary: Negative for dysuria and hematuria.  Musculoskeletal: Negative for back pain and joint pain.  Skin: Negative for rash.  Neurological: Negative for sensory change, speech change, focal weakness and headaches.  Endo/Heme/Allergies: Does not bruise/bleed easily.  Psychiatric/Behavioral: Negative for depression. The patient is not nervous/anxious.     DRUG ALLERGIES:  No Known Allergies  VITALS:  Blood pressure (!) 125/57, pulse 97, temperature 99.1 F (37.3 C), temperature source Oral, resp. rate 18, height 4\' 10"  (1.473 m), weight 65.1 kg, SpO2 100 %.  PHYSICAL EXAMINATION:   Physical Exam  GENERAL:  77 y.o.-year-old patient lying in the bed with no acute distress.  EYES: Pupils equal, round, reactive to light and accommodation. No scleral icterus. Extraocular muscles intact.  HEENT: Head atraumatic, normocephalic. Oropharynx and nasopharynx clear.  NECK:  Supple, no jugular venous distention. No thyroid enlargement, no tenderness.  LUNGS: Normal breath sounds bilaterally, no wheezing, rales, rhonchi. No use of accessory muscles of respiration.  CARDIOVASCULAR: S1, S2 normal. No murmurs, rubs, or gallops.   ABDOMEN: Soft, nontender, nondistended. Bowel sounds present. No organomegaly or mass.  EXTREMITIES: No cyanosis, clubbing or edema b/l.    NEUROLOGIC: Cranial nerves II through XII are intact. No focal Motor or sensory deficits b/l.   PSYCHIATRIC: The patient is alert and oriented x 3.  SKIN: No obvious rash, lesion, or ulcer.   LABORATORY PANEL:   CBC Recent Labs  Lab 05/04/18 0412  WBC 7.5  HGB 9.0*  HCT 25.9*  PLT 285   ------------------------------------------------------------------------------------------------------------------ Chemistries  Recent Labs  Lab 05/03/18 1327 05/04/18 0412  NA 133* 136  K 3.2* 3.5  CL 98 104  CO2 23 23  GLUCOSE 126* 120*  BUN 18 15  CREATININE 1.29* 0.93  CALCIUM 8.2* 7.2*  AST 18  --   ALT 12  --   ALKPHOS 94  --   BILITOT 0.8  --    ------------------------------------------------------------------------------------------------------------------  Cardiac Enzymes No results for input(s): TROPONINI in the last 168 hours. ------------------------------------------------------------------------------------------------------------------  RADIOLOGY:  US Renal  Result Date: 05/04/2018 CLINICAL DATA:  Urinary tract infection. EXAM: RENAL / URINARY TRACT ULTRASOUND COMPLETE COMPARISON:  None. FINDINGS: Right Kidney: Length: 10.1 cm. Echogenicity within normal limits. No mass or hydronephrosis visualized. Left Kidney: Length: 10.1 cm. 2.9 cm simple cyst is seen in upper pole. Echogenicity within normal limits. No mass or hydronephrosis visualized. Bladder: Appears normal for degree of bladder distention. IMPRESSION: No significant renal abnormality is noted. Electronically Signed   By: Marijo Conception, M.D.   On: 05/04/2018 09:40     ASSESSMENT AND PLAN:   *Sepsis due to UTI and E. coli bacteremia On IV meropenem.   Was on Keflex 2 weeks back.  Will likely need a third-generation cephalosporin at  discharge.  *Hypokalemia Replaced  *Acute kidney injury has resolved.  Renal ultrasound shows nothing acute.  *Hypertension Continue medications.   *Chronic diastolic congestive heart failure Continue oral Lasix from home.  *Hyperlipidemia Continue atorvastatin.  *Renal cyst on ultrasound.  Patient follows with nephrology in [place] and plan is to get MRI as outpatient.  All the records are reviewed and case discussed with Care Management/Social Worker Management plans discussed with the patient, family and they are in agreement.  CODE STATUS: FULL CODE  DVT Prophylaxis: SCDs  TOTAL TIME TAKING CARE OF THIS PATIENT: 30 minutes.   POSSIBLE D/C IN 1-2 DAYS, DEPENDING ON CLINICAL CONDITION.  Neita Carp M.D on 05/05/2018 at 12:51 PM  Between 7am to 6pm - Pager - [phone]  After 6pm go to [email or web address] - password [email or web address] [facility]  Office  [phone]  CC: Primary care physician; System, Pcp Not In  Note: This dictation was prepared with Dragon dictation along with smaller phrase technology. Any transcriptional errors that result from this process are unintentional.

## 2018-05-05 NOTE — Care Management Important Message (Signed)
Important Message  Patient Details  Name: Julie Lutz MRN: [id number] Date of Birth: September 09, 1941   Medicare Important Message Given:  Yes    Juliann Pulse A Dayona Shaheen 05/05/2018, 11:30 AM

## 2018-05-05 NOTE — Consult Note (Addendum)
Pharmacy Antibiotic Note  Julie Lutz is a 77 y.o. female admitted on 05/03/2018 with UTI.  Pharmacy has been consulted for Merrem dosing.  Plan: Discussed with Dr. Darvin Neighbours to continue Merrem for now. Will continue to monitor blood cultures for potential de-escalation.  Continue Merrem 1 gram every 12 hours.  Height: 4\' 10"  (147.3 cm) Weight: 143 lb 8 oz (65.1 kg) IBW/kg (Calculated) : 40.9  Temp (24hrs), Avg:99.4 F (37.4 C), Min:98.6 F (37 C), Max:101 F (38.3 C)  Recent Labs  Lab 05/03/18 1327 05/04/18 0412  WBC 11.0 7.5  CREATININE 1.29* 0.93  LATICACIDVEN 1.2  --     Estimated Creatinine Clearance: 39.2 mL/min (by C-G formula based on SCr of 0.93 mg/dL).    No Known Allergies  Antimicrobials this admission: 0815 Merrem >> 0814 Ceftriaxone x 1 0814 Ciprofloxacin x1  Microbiology results: 0814 BCx: e.coli [id number] UCx: e.coli Resistant to Ampicillin, Cipro, Bactrim, Unasyn - ESBL negative   Thank you for allowing pharmacy to be a part of this patient's care.  Paticia Stack, PharmD Pharmacy Resident  05/05/2018 2:18 PM

## 2018-05-06 LAB — BASIC METABOLIC PANEL
Anion gap: 7 (ref 5–15)
BUN: 7 mg/dL — ABNORMAL LOW (ref 8–23)
CALCIUM: 8.3 mg/dL — AB (ref 8.9–10.3)
CO2: 27 mmol/L (ref 22–32)
CREATININE: 0.9 mg/dL (ref 0.44–1.00)
Chloride: 103 mmol/L (ref 98–111)
GFR calc Af Amer: >60 mL/min (ref >60)
GFR, EST NON AFRICAN AMERICAN: 59 mL/min — AB (ref >60)
GLUCOSE: 105 mg/dL — AB (ref 70–99)
Potassium: 3.6 mmol/L (ref 3.5–5.1)
Sodium: 137 mmol/L (ref 135–145)

## 2018-05-06 LAB — CULTURE, BLOOD (ROUTINE X 2): Special Requests: ADEQUATE

## 2018-05-06 LAB — CBC WITH DIFFERENTIAL/PLATELET
BASOS ABS: 0 10*3/uL (ref 0–0.1)
BASOS PCT: 1 %
EOS PCT: 8 %
Eosinophils Absolute: 0.4 10*3/uL (ref 0–0.7)
HCT: 28.3 % — ABNORMAL LOW (ref 35.0–47.0)
Hemoglobin: 9.6 g/dL — ABNORMAL LOW (ref 12.0–16.0)
LYMPHS PCT: 30 %
Lymphs Abs: 1.4 10*3/uL (ref 1.0–3.6)
MCH: 29.9 pg (ref 26.0–34.0)
MCHC: 34 g/dL (ref 32.0–36.0)
MCV: 87.9 fL (ref 80.0–100.0)
MONO ABS: 0.7 10*3/uL (ref 0.2–0.9)
MONOS PCT: 15 %
Neutro Abs: 2.2 10*3/uL (ref 1.4–6.5)
Neutrophils Relative %: 46 %
PLATELETS: 323 10*3/uL (ref 150–440)
RBC: 3.22 MIL/uL — ABNORMAL LOW (ref 3.80–5.20)
RDW: 13.8 % (ref 11.5–14.5)
WBC: 4.8 10*3/uL (ref 3.6–11.0)

## 2018-05-06 MED ORDER — ONDANSETRON 4 MG PO TBDP: 4.0000 mg | ORAL_TABLET | Freq: Three times a day (TID) | ORAL | 0 refills | Status: DC | PRN

## 2018-05-06 MED ORDER — CEFPODOXIME PROXETIL 100 MG PO TABS
100.0000 mg | ORAL_TABLET | Freq: Two times a day (BID) | ORAL | 0 refills | Status: AC
Start: 2018-05-06 — End: 2018-05-14

## 2018-05-08 LAB — CULTURE, BLOOD (ROUTINE X 2)
Culture: NO GROWTH
Special Requests: ADEQUATE

## 2018-05-13 NOTE — Discharge Summary (Signed)
[facility] NAME: Julie Lutz    MR#:  [id number]  DATE OF BIRTH:  Mar 15, 1941  DATE OF ADMISSION:  05/03/2018 ADMITTING PHYSICIAN: Vaughan Basta, MD  DATE OF DISCHARGE: 05/06/2018 12:45 PM  PRIMARY CARE PHYSICIAN: System, Pcp Not In    ADMISSION DIAGNOSIS:  Lower urinary tract infectious disease [N39.0] UTI (urinary tract infection) [N39.0] Urinary tract infection, acute [N39.0] Sepsis, due to unspecified organism ([place]) [A41.9]  DISCHARGE DIAGNOSIS:  Principal Problem:   Sepsis ([place])   SECONDARY DIAGNOSIS:   Past Medical History:  Diagnosis Date  . 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 CHF (congestive heart failure) ([place])   . Hyperlipidemia   . Hypertension   . Hyponatremia     HOSPITAL COURSE:   *Sepsis due to UTI and E. coli bacteremia On IV meropenem.   Was on Keflex 2 weeks back.  Give third-generation cephalosporin at discharge.  *Hypokalemia Replaced  *Acute kidney injury has resolved.  Renal ultrasound shows nothing acute.  *Hypertension Continue medications.   *Chronic diastolic congestive heart failure Continue oral Lasix from home.  *Hyperlipidemia Continue atorvastatin.  *Renal cyst on ultrasound.  Patient follows with nephrology in [place] and plan is to get MRI as outpatient.  DISCHARGE CONDITIONS:   Stable.  CONSULTS OBTAINED:  Treatment Team:  Hillary Bow, MD  DRUG ALLERGIES:  No Known Allergies  DISCHARGE MEDICATIONS:   Allergies as of 05/06/2018   No Known Allergies     Medication List    TAKE these medications   aspirin EC 81 MG tablet Take 81 mg by mouth daily.   atorvastatin 10 MG tablet Commonly known as:  LIPITOR Take 10 mg by mouth at bedtime.   cefpodoxime 100 MG tablet Commonly known as:  VANTIN Take 1 tablet (100 mg total) by mouth 2 (two) times daily for 8 days.   escitalopram 10 MG tablet Commonly known as:  LEXAPRO Take 10 mg by  mouth every morning.   furosemide 20 MG tablet Commonly known as:  LASIX Take 20 mg by mouth daily.   irbesartan 150 MG tablet Commonly known as:  AVAPRO Take 150 mg by mouth daily.   lansoprazole 30 MG capsule Commonly known as:  PREVACID Take 30 mg by mouth daily.   MAGNESIUM CITRATE PO Take 1 tablet by mouth daily.   metoprolol tartrate 50 MG tablet Commonly known as:  LOPRESSOR Take 50 mg by mouth 2 (two) times daily.   mirabegron ER 50 MG Tb24 tablet Commonly known as:  MYRBETRIQ Take 50 mg by mouth daily.   ondansetron 4 MG disintegrating tablet Commonly known as:  ZOFRAN-ODT Take 1 tablet (4 mg total) by mouth every 8 (eight) hours as needed for nausea or vomiting.   ondansetron 4 MG tablet Commonly known as:  ZOFRAN Take 1 tablet (4 mg total) by mouth daily as needed for nausea or vomiting.   sodium chloride 1 g tablet Take 1 g by mouth 2 (two) times daily.        DISCHARGE INSTRUCTIONS:    Follow with PMD in 1-2 weeks.  If you experience worsening of your admission symptoms, develop shortness of breath, life threatening emergency, suicidal or homicidal thoughts you must seek medical attention immediately by calling 911 or calling your MD immediately  if symptoms less severe.  You Must read complete instructions/literature along with all the possible adverse reactions/side effects for all the Medicines you take and that have  been prescribed to you. Take any new Medicines after you have completely understood and accept all the possible adverse reactions/side effects.   Please note  You were cared for by a hospitalist during your hospital stay. If you have any questions about your discharge medications or the care you received while you were in the hospital after you are discharged, you can call the unit and asked to speak with the hospitalist on call if the hospitalist that took care of you is not available. Once you are discharged, your primary care  physician will handle any further medical issues. Please note that NO REFILLS for any discharge medications will be authorized once you are discharged, as it is imperative that you return to your primary care physician (or establish a relationship with a primary care physician if you do not have one) for your aftercare needs so that they can reassess your need for medications and monitor your lab values.    Today   CHIEF COMPLAINT:   Chief Complaint  Patient presents with  . Urinary Tract Infection  . Fever    HISTORY OF PRESENT ILLNESS:  Julie Lutz  is a 77 y.o. female with a known history of  vocal cord cancer, CHF, hyperlipidemia, hypertension, hyponatremia-was admitted in [place] for UTI and hyponatremia for 5 days in hospital.  That was 3 weeks ago, she was discharged with 3 days course of oral Keflex.  She was feeling fine since then until yesterday.  She is visiting her daughters here in [place], flew in yesterday and also had some chills on the flight.  She also had some fever and chills today morning with some burning urinary symptoms and some cloudy urine so decided to come to emergency room. Noted to have UTI again and she was slightly tachycardic and have elevated white blood cell count so ER physician suggested to admit to hospitalist team.   VITAL SIGNS:  Blood pressure (!) 150/76, pulse 91, temperature 97.9 F (36.6 C), temperature source Oral, resp. rate 18, height 4\' 10"  (1.473 m), weight 65.1 kg, SpO2 99 %.  I/O:  No intake or output data in the 24 hours ending 05/13/18 2107  PHYSICAL EXAMINATION:  GENERAL:  77 y.o.-year-old patient lying in the bed with no acute distress.  EYES: Pupils equal, round, reactive to light and accommodation. No scleral icterus. Extraocular muscles intact.  HEENT: Head atraumatic, normocephalic. Oropharynx and nasopharynx clear.  NECK:  Supple, no jugular venous distention. No thyroid enlargement, no tenderness.  LUNGS: Normal  breath sounds bilaterally, no wheezing, rales,rhonchi or crepitation. No use of accessory muscles of respiration.  CARDIOVASCULAR: S1, S2 normal. No murmurs, rubs, or gallops.  ABDOMEN: Soft, non-tender, non-distended. Bowel sounds present. No organomegaly or mass.  EXTREMITIES: No pedal edema, cyanosis, or clubbing.  NEUROLOGIC: Cranial nerves II through XII are intact. Muscle strength 5/5 in all extremities. Sensation intact. Gait not checked.  PSYCHIATRIC: The patient is alert and oriented x 3.  SKIN: No obvious rash, lesion, or ulcer.   DATA REVIEW:   CBC No results for input(s): WBC, HGB, HCT, PLT in the last 168 hours.  Chemistries  No results for input(s): NA, K, CL, CO2, GLUCOSE, BUN, CREATININE, CALCIUM, MG, AST, ALT, ALKPHOS, BILITOT in the last 168 hours.  Invalid input(s): GFRCGP  Cardiac Enzymes No results for input(s): TROPONINI in the last 168 hours.  Microbiology Results  Results for orders placed or performed during the hospital encounter of 05/03/18  Urine Culture  Status: Abnormal   Collection Time: 05/03/18  1:27 PM  Result Value Ref Range Status   Specimen Description   Final    URINE, CLEAN CATCH Performed at [facility], [address] [place], [place] [address]    Special Requests   Final    Normal Performed at [facility], [address] [place], [place] [address]    Culture >=100,000 COLONIES/mL ESCHERICHIA COLI (A)  Final   Report Status 05/05/2018 FINAL  Final   Organism ID, Bacteria ESCHERICHIA COLI (A)  Final      Susceptibility   Escherichia coli - MIC*    AMPICILLIN >=32 RESISTANT Resistant     CEFAZOLIN <=4 SENSITIVE Sensitive     CEFTRIAXONE <=1 SENSITIVE Sensitive     CIPROFLOXACIN >=4 RESISTANT Resistant     GENTAMICIN <=1 SENSITIVE Sensitive     IMIPENEM <=0.25 SENSITIVE Sensitive     NITROFURANTOIN <=16 SENSITIVE Sensitive     TRIMETH/SULFA >=320 RESISTANT Resistant     AMPICILLIN/SULBACTAM >=32 RESISTANT  Resistant     PIP/TAZO 8 SENSITIVE Sensitive     Extended ESBL NEGATIVE Sensitive     * >=100,000 COLONIES/mL ESCHERICHIA COLI  Blood Culture (routine x 2)     Status: Abnormal   Collection Time: 05/03/18  3:26 PM  Result Value Ref Range Status   Specimen Description   Final    BLOOD BLOOD LEFT FOREARM Performed at [facility], [address], [place], [place] [address]    Special Requests   Final    BOTTLES DRAWN AEROBIC AND ANAEROBIC Blood Culture adequate volume Performed at [facility], [address], [place], [place] [address]    Culture  Setup Time   Final    GRAM NEGATIVE RODS AEROBIC BOTTLE ONLY CRITICAL RESULT CALLED TO, READ BACK BY AND VERIFIED WITH: DAVID BESANTI AT 0258 05/04/18 SDR Performed at [facility], [place] [address], [place], [place] [address]    Culture ESCHERICHIA COLI (A)  Final   Report Status 05/06/2018 FINAL  Final   Organism ID, Bacteria ESCHERICHIA COLI  Final      Susceptibility   Escherichia coli - MIC*    AMPICILLIN >=32 RESISTANT Resistant     CEFAZOLIN <=4 SENSITIVE Sensitive     CEFEPIME <=1 SENSITIVE Sensitive     CEFTAZIDIME <=1 SENSITIVE Sensitive     CEFTRIAXONE <=1 SENSITIVE Sensitive     CIPROFLOXACIN >=4 RESISTANT Resistant     GENTAMICIN <=1 SENSITIVE Sensitive     IMIPENEM <=0.25 SENSITIVE Sensitive     TRIMETH/SULFA >=320 RESISTANT Resistant     AMPICILLIN/SULBACTAM >=32 RESISTANT Resistant     PIP/TAZO 8 SENSITIVE Sensitive     Extended ESBL NEGATIVE Sensitive     * ESCHERICHIA COLI  Blood Culture (routine x 2)     Status: None   Collection Time: 05/03/18  3:26 PM  Result Value Ref Range Status   Specimen Description BLOOD RIGHT ANTECUBITAL  Final   Special Requests   Final    BOTTLES DRAWN AEROBIC AND ANAEROBIC Blood Culture adequate volume   Culture   Final    NO GROWTH 5 DAYS Performed at [facility], [address] [place],  [address]    Report Status 05/08/2018 FINAL   Final  Blood Culture ID Panel (Reflexed)     Status: Abnormal   Collection Time: 05/03/18  3:26 PM  Result Value Ref Range Status   Enterococcus species NOT DETECTED NOT DETECTED Final  Listeria monocytogenes NOT DETECTED NOT DETECTED Final   Staphylococcus species NOT DETECTED NOT DETECTED Final   Staphylococcus aureus NOT DETECTED NOT DETECTED Final   Streptococcus species NOT DETECTED NOT DETECTED Final   Streptococcus agalactiae NOT DETECTED NOT DETECTED Final   Streptococcus pneumoniae NOT DETECTED NOT DETECTED Final   Streptococcus pyogenes NOT DETECTED NOT DETECTED Final   Acinetobacter baumannii NOT DETECTED NOT DETECTED Final   Enterobacteriaceae species DETECTED (A) NOT DETECTED Final    Comment: Enterobacteriaceae represent a large family of gram-negative bacteria, not a single organism. CRITICAL RESULT CALLED TO, READ BACK BY AND VERIFIED WITH:  DAVID BESANTI AT 4970 05/04/18 SDR    Enterobacter cloacae complex NOT DETECTED NOT DETECTED Final   Escherichia coli DETECTED (A) NOT DETECTED Final    Comment: CRITICAL RESULT CALLED TO, READ BACK BY AND VERIFIED WITH: DAVID BESANTI AT 2637 05/04/18 SDR    Klebsiella oxytoca NOT DETECTED NOT DETECTED Final   Klebsiella pneumoniae NOT DETECTED NOT DETECTED Final   Proteus species NOT DETECTED NOT DETECTED Final   Serratia marcescens NOT DETECTED NOT DETECTED Final   Carbapenem resistance NOT DETECTED NOT DETECTED Final   Haemophilus influenzae NOT DETECTED NOT DETECTED Final   Neisseria meningitidis NOT DETECTED NOT DETECTED Final   Pseudomonas aeruginosa NOT DETECTED NOT DETECTED Final   Candida albicans NOT DETECTED NOT DETECTED Final   Candida glabrata NOT DETECTED NOT DETECTED Final   Candida krusei NOT DETECTED NOT DETECTED Final   Candida parapsilosis NOT DETECTED NOT DETECTED Final   Candida tropicalis NOT DETECTED NOT DETECTED Final    Comment: Performed at [facility], [address] [place], [place]  [address]    RADIOLOGY:  No results found.  EKG:   Orders placed or performed during the hospital encounter of 05/03/18  . ED EKG 12-Lead  . ED EKG 12-Lead  . EKG 12-Lead  . EKG 12-Lead  . EKG      Management plans discussed with the patient, family and they are in agreement.  CODE STATUS:  Code Status History    Date Active Date Inactive Code Status Order ID Comments User Context   05/03/2018 2042 05/06/2018 1806 DNR [id number]  Vaughan Basta, MD Inpatient    Questions for Most Recent Historical Code Status (Order [id number])    Question Answer Comment   In the event of cardiac or respiratory ARREST Do not call a "code blue"    In the event of cardiac or respiratory ARREST Do not perform Intubation, CPR, defibrillation or ACLS    In the event of cardiac or respiratory ARREST Use medication by any route, position, wound care, and other measures to relive pain and suffering. May use oxygen, suction and manual treatment of airway obstruction as needed for comfort.         Advance Directive Documentation     Most Recent Value  Type of Advance Directive  Healthcare Power of Attorney  Pre-existing out of facility DNR order (yellow form or pink MOST form)  -  "MOST" Form in Place?  -      TOTAL TIME TAKING CARE OF THIS PATIENT: 35 minutes.    Vaughan Basta M.D on 05/13/2018 at 9:07 PM  Between 7am to 6pm - Pager - [phone]  After 6pm go to [email or web address] - password [email or web address] [facility]  Office  [phone]  CC: Primary care physician; System, Pcp Not In   Note: This dictation was prepared with Dragon dictation along with smaller phrase  technology. Any transcriptional errors that result from this process are unintentional.

## 2018-10-04 DIAGNOSIS — N179 Acute kidney failure, unspecified: Secondary | ICD-10-CM | POA: Insufficient documentation

## 2019-08-16 IMAGING — US US RENAL
1 series · 14 of 25 positions shown · non-contrast
Comparison: None.

CLINICAL DATA: Urinary tract infection.

EXAM:
RENAL / URINARY TRACT ULTRASOUND COMPLETE

[Series 1: us renal · 0.23mm/px · 14 of 28 slices shown]
[im 1/28]
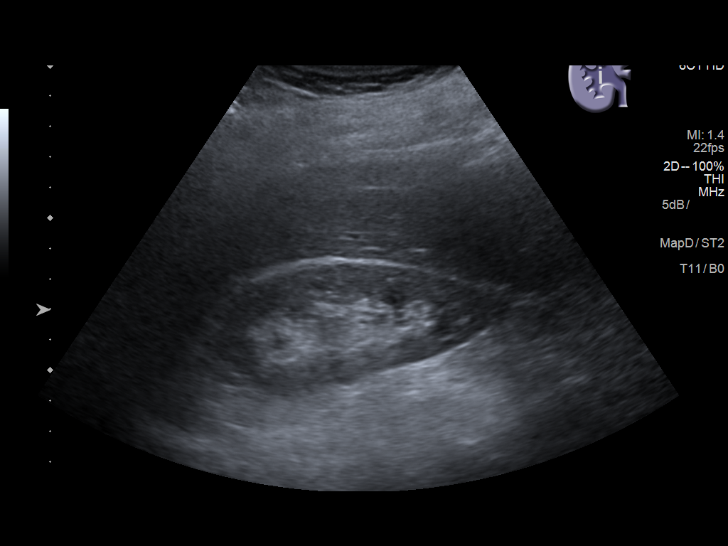
[im 3/28]
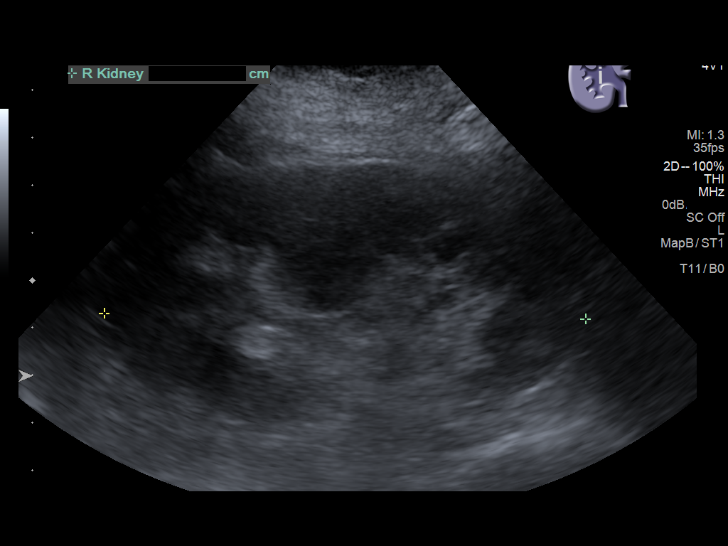
[im 5/28]
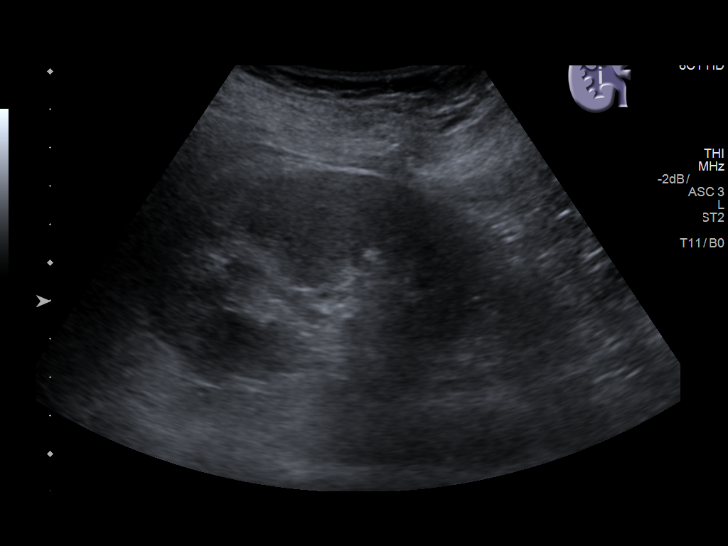
[im 7/28]
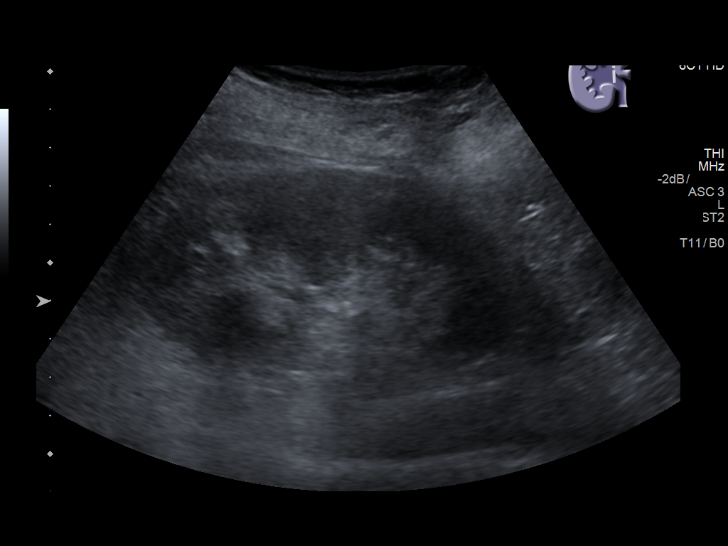
[im 10/28]
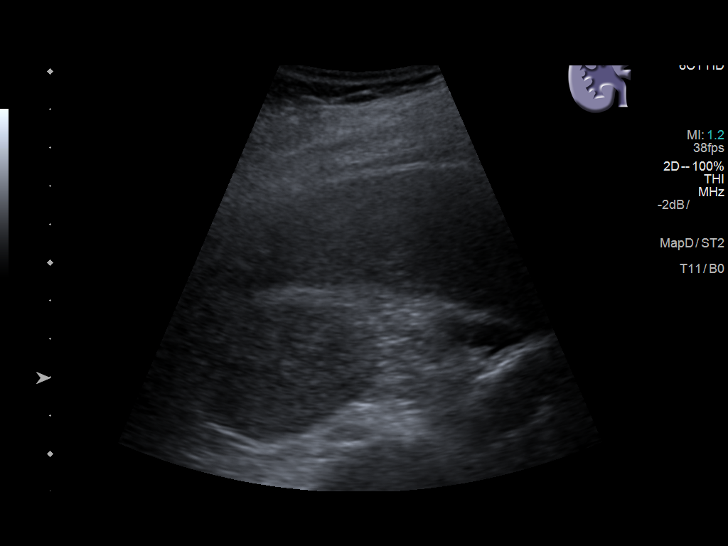
[im 11/28]
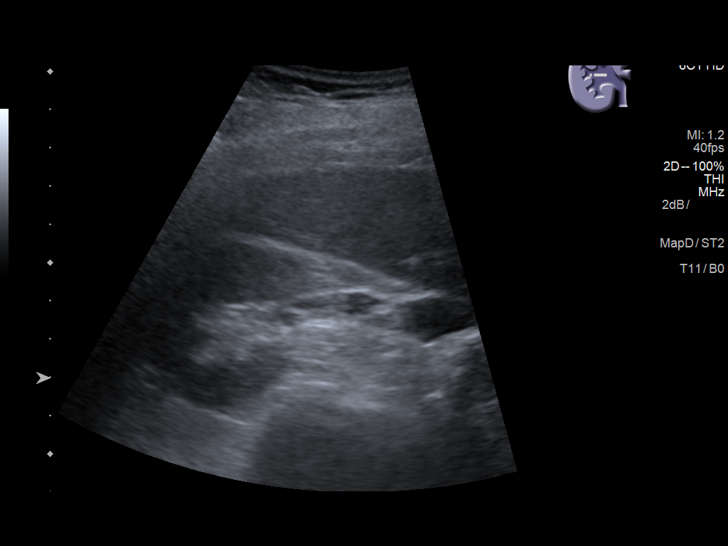
[im 13/28]
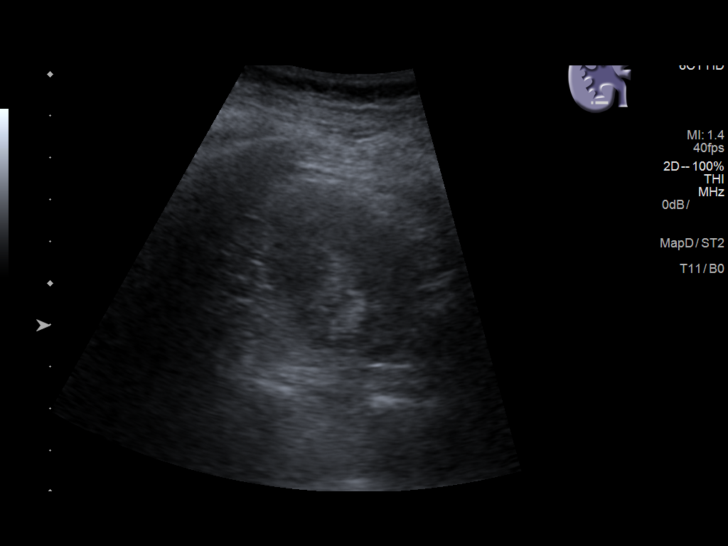
[im 15/28]
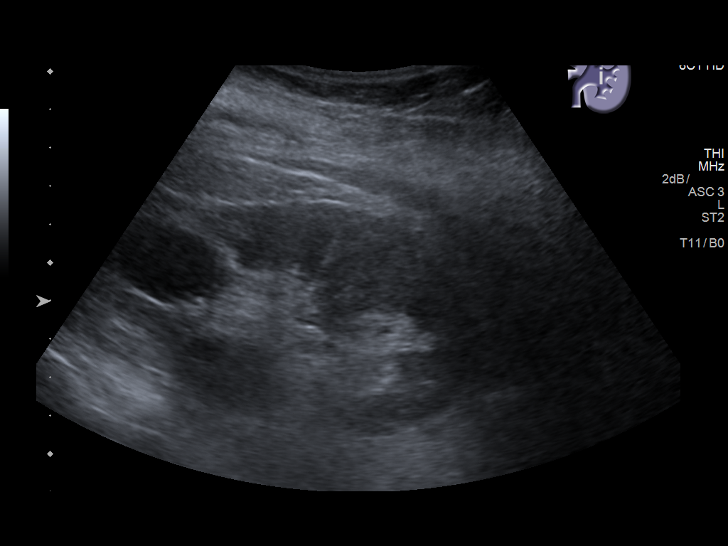
[im 17/28]
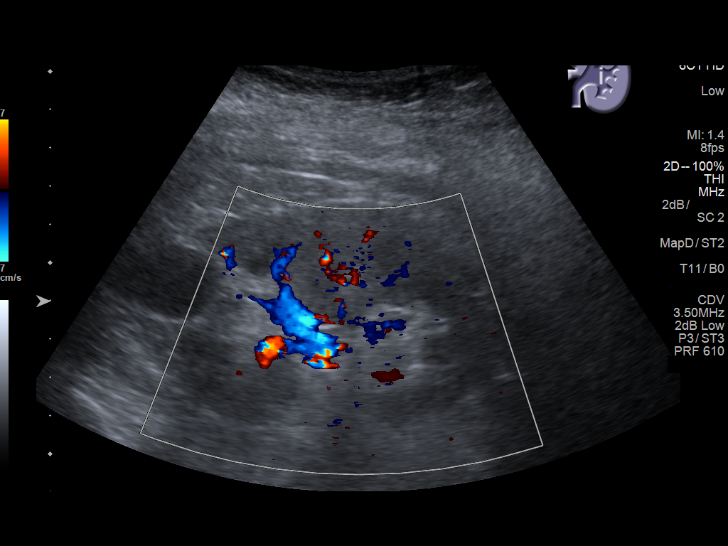
[im 19/28]
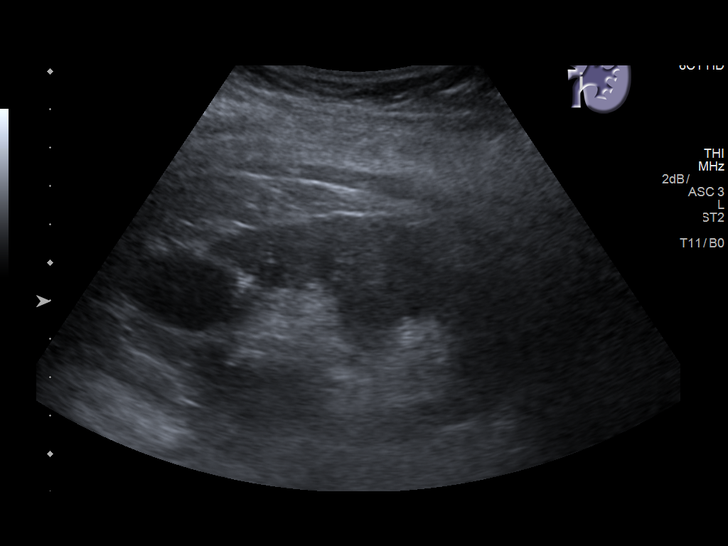
[im 21/28]
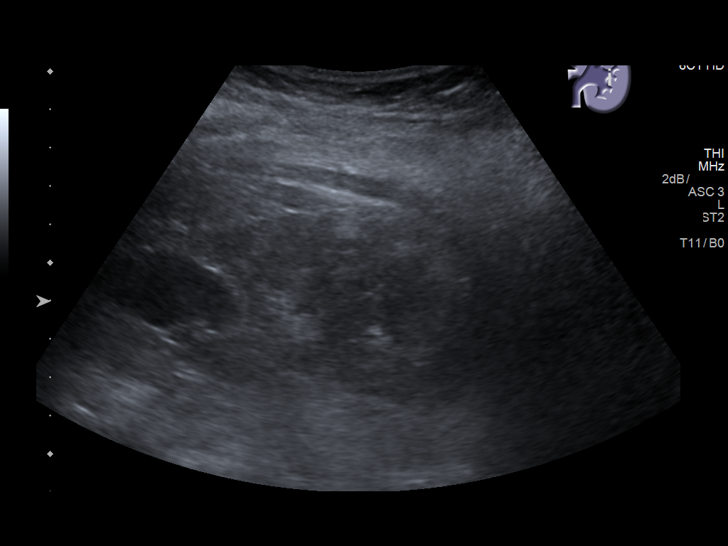
[im 23/28]
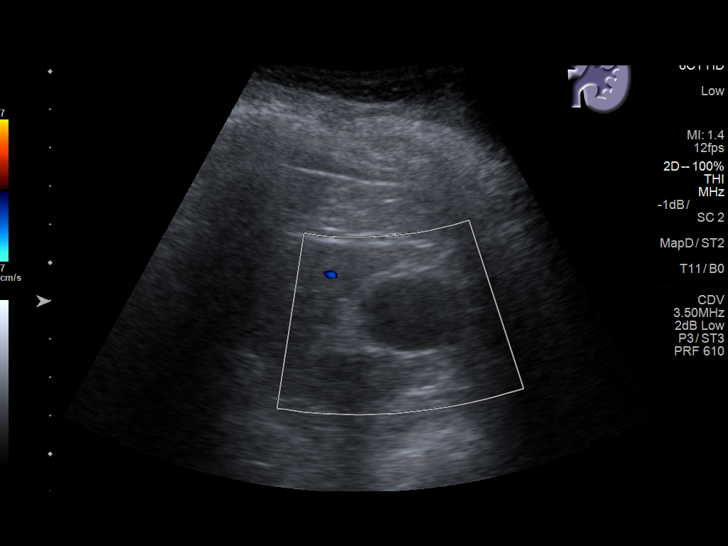
[im 25/28]
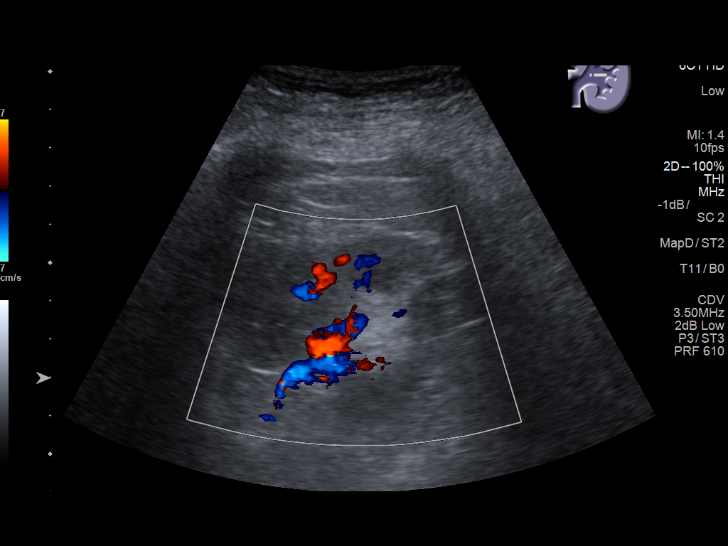
[im 28/28]
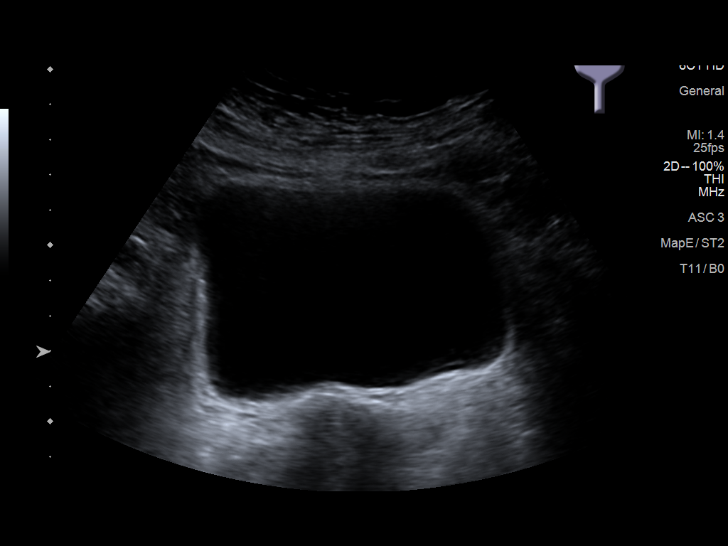

[14 of 25 positions shown; findings below may reference images not displayed]

FINDINGS: Right Kidney:

Length: 10.1 cm. Echogenicity within normal limits. No mass or
hydronephrosis visualized.

Left Kidney:

Length: 10.1 cm. 2.9 cm simple cyst is seen in upper pole.
Echogenicity within normal limits. No mass or hydronephrosis
visualized.

Bladder:

Appears normal for degree of bladder distention.
IMPRESSION: No significant renal abnormality is noted.

## 2019-09-21 DIAGNOSIS — Z87891 Personal history of nicotine dependence: Secondary | ICD-10-CM | POA: Insufficient documentation

## 2019-11-01 DIAGNOSIS — E875 Hyperkalemia: Secondary | ICD-10-CM | POA: Insufficient documentation

## 2019-11-01 DIAGNOSIS — N1831 Chronic kidney disease, stage 3a: Secondary | ICD-10-CM | POA: Insufficient documentation

## 2020-06-04 DIAGNOSIS — R600 Localized edema: Secondary | ICD-10-CM | POA: Insufficient documentation

## 2020-08-24 DIAGNOSIS — Z9181 History of falling: Secondary | ICD-10-CM | POA: Insufficient documentation

## 2020-08-24 DIAGNOSIS — E46 Unspecified protein-calorie malnutrition: Secondary | ICD-10-CM | POA: Insufficient documentation

## 2020-08-24 DIAGNOSIS — M79652 Pain in left thigh: Secondary | ICD-10-CM | POA: Insufficient documentation

## 2020-08-24 DIAGNOSIS — I2510 Atherosclerotic heart disease of native coronary artery without angina pectoris: Secondary | ICD-10-CM | POA: Insufficient documentation

## 2020-08-24 DIAGNOSIS — F32A Depression, unspecified: Secondary | ICD-10-CM | POA: Insufficient documentation

## 2020-09-22 DIAGNOSIS — M51369 Other intervertebral disc degeneration, lumbar region without mention of lumbar back pain or lower extremity pain: Secondary | ICD-10-CM | POA: Insufficient documentation

## 2020-09-22 DIAGNOSIS — M5416 Radiculopathy, lumbar region: Secondary | ICD-10-CM | POA: Insufficient documentation

## 2020-09-22 DIAGNOSIS — M419 Scoliosis, unspecified: Secondary | ICD-10-CM | POA: Insufficient documentation

## 2020-10-07 DIAGNOSIS — M47816 Spondylosis without myelopathy or radiculopathy, lumbar region: Secondary | ICD-10-CM | POA: Insufficient documentation

## 2020-11-18 HISTORY — PX: LAMINECTOMY: SHX219

## 2021-02-11 DIAGNOSIS — M545 Low back pain, unspecified: Secondary | ICD-10-CM | POA: Insufficient documentation

## 2021-03-20 HISTORY — PX: SPINAL FUSION: SHX223

## 2021-11-18 HISTORY — PX: LARYNGOSCOPY: SUR817

## 2022-05-20 ENCOUNTER — Ambulatory Visit: Payer: PRIVATE HEALTH INSURANCE | Admitting: Family Medicine

## 2022-05-25 ENCOUNTER — Ambulatory Visit (INDEPENDENT_AMBULATORY_CARE_PROVIDER_SITE_OTHER): Payer: Medicare Other | Admitting: Family Medicine

## 2022-05-25 ENCOUNTER — Encounter: Payer: Self-pay | Admitting: Family Medicine

## 2022-05-25 VITALS — BP 122/66 | HR 71 | Temp 98.4°F | Ht 59.0 in | Wt 135.5 lb

## 2022-05-25 DIAGNOSIS — E871 Hypo-osmolality and hyponatremia: Secondary | ICD-10-CM

## 2022-05-25 DIAGNOSIS — F5101 Primary insomnia: Secondary | ICD-10-CM

## 2022-05-25 DIAGNOSIS — M816 Localized osteoporosis [Lequesne]: Secondary | ICD-10-CM

## 2022-05-25 DIAGNOSIS — Z79899 Other long term (current) drug therapy: Secondary | ICD-10-CM | POA: Diagnosis not present

## 2022-05-25 DIAGNOSIS — N3281 Overactive bladder: Secondary | ICD-10-CM

## 2022-05-25 DIAGNOSIS — J8283 Eosinophilic asthma: Secondary | ICD-10-CM

## 2022-05-25 DIAGNOSIS — K219 Gastro-esophageal reflux disease without esophagitis: Secondary | ICD-10-CM | POA: Diagnosis not present

## 2022-05-25 DIAGNOSIS — M48062 Spinal stenosis, lumbar region with neurogenic claudication: Secondary | ICD-10-CM

## 2022-05-25 DIAGNOSIS — I10 Essential (primary) hypertension: Secondary | ICD-10-CM

## 2022-05-25 DIAGNOSIS — J45909 Unspecified asthma, uncomplicated: Secondary | ICD-10-CM | POA: Insufficient documentation

## 2022-05-25 DIAGNOSIS — C320 Malignant neoplasm of glottis: Secondary | ICD-10-CM | POA: Insufficient documentation

## 2022-05-25 MED ORDER — MIRABEGRON ER 50 MG PO TB24: 50.0000 mg | ORAL_TABLET | Freq: Every day | ORAL | 3 refills | Status: DC

## 2022-05-25 MED ORDER — FAMOTIDINE 40 MG PO TABS: 40.0000 mg | ORAL_TABLET | Freq: Every day | ORAL | 3 refills | Status: DC

## 2022-05-25 NOTE — Progress Notes (Signed)
New Patient Office Visit  Subjective:  Patient ID: Julie Lutz, female    DOB: 1941/07/13  Age: 81 y.o. MRN: [id number]  CC:  Chief Complaint  Patient presents with   Establish Care    Need new pcp     HPI-her w/daughter Julie Lutz presents for new pt.  From [place]-moved to be closer to family.   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 2.  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 Rad 17 yrs ago.  Chronic hoarseness.  Pt working on seeing ENT.  3.  HTN-Pt is on amlodipine 2.'5mg'$ ,carvedilol '25mg'$  bid, lasix '40mg'$ .  States never had CHF(in problem list).  No ha/dizziness/cp/palp/edema/cough/sob.  Wants to see Card-has been seeing in [place]..  4.  Was on prevacid in past to prevent reflux in throat(has h/o throat ca), then, Mg went low so wants to change to pepcid 5.  Hyponatremia in past-saw neph. 6.  OAB-mybetric works well.  Saw urol in past.  No SE.  No other meds.   7.  Rheum-osteoporosis-reclast last and this yr.  Due dxa.  Has been on and off reclast through the years depending on dxa.  8.  Insomnia-has tried mult other meds.  Xanax works and no SE.  Saw psych as well in past.  Helps fall asleep.   9.  Spinal stenosis-has had fusions in past.  Using cane.  Needs handicapped form.    Past Medical History:  Diagnosis Date   Allergy    Arthritis    Asthma    esosinophillic   Cancer ([place])    vocal cord   Cataract    CHF (congestive heart failure) (H[facility])    GERD (gastroesophageal reflux disease)    Hyperlipidemia    Hypertension    Hyponatremia    Osteoporosis    Spinal stenosis     Past Surgical History:  Procedure Laterality Date   FEMUR FRACTURE SURGERY Right 08/2016   HERNIA REPAIR Right    inguinal   JOINT REPLACEMENT  2017   bilateral knee   LAMINECTOMY  11/2020   L3-5   LARYNGOSCOPY  11/2021   SPINAL FUSION  03/2021    Family History  Problem Relation Age of Onset   Diabetes Sister    Cancer Sister 44       ovarian   Anemia Sister      Social History   Socioeconomic History   Marital status: Widowed    Spouse name: Not on file   Number of children: 2   Years of education: Not on file   Highest education level: Not on file  Occupational History   Not on file  Tobacco Use   Smoking status: Former    Types: Cigarettes    Quit date: 1990    Years since quitting: 33.6   Smokeless tobacco: Never  Vaping Use   Vaping Use: Not on file  Substance and Sexual Activity   Alcohol use: Yes    Alcohol/week: 5.0 standard drinks of alcohol    Types: 5 Glasses of wine per week    Comment: occasional   Drug use: Never   Sexual activity: Not Currently  Other Topics Concern   Not on file  Social History Narrative   2 grands   Retired Therapist, sports   Social Determinants of Radio broadcast assistant Strain: Not on file  Food Insecurity: Not on file  Transportation Needs: Not on file  Physical Activity: Not on file  Stress: Not on file  Social Connections:  Not on file  Intimate Partner Violence: Not on file    ROS  ROS: Gen: no fever, chills  Skin: no rash, itching ENT: no ear pain, ear drainage, nasal congestion, rhinorrhea, sinus pressure, sore throat Eyes: no blurry vision, double vision Resp: no cough, wheeze,SOB CV: no CP, palpitations, LE edema,  GI: no heartburn, n/v/d/c, abd pain GU: no dysuria, urgency, frequency, hematuria MSK: HPI Neuro: no dizziness, headache, weakness, vertigo Psych: no depression, anxiety SI   Objective:   Today's Vitals: BP 122/66   Pulse 71   Temp 98.4 F (36.9 C) (Temporal)   Ht '4\' 11"'$  (1.499 m)   Wt 135 lb 8 oz (61.5 kg)   SpO2 97%   BMI 27.37 kg/m   Physical Exam  Gen: WDWN NAD wf HEENT: NCAT, conjunctiva not injected, sclera nonicteric NECK:  supple, no thyromegaly, no nodes, no carotid bruits CARDIAC: RRR, S1S2+, no murmur. DP 2+B LUNGS: CTAB. No wheezes ABDOMEN:  BS+, soft, NTND, No HSM, no masses EXT:  no edema MSK: no gross abnormalities.  NEURO: A&O x3.  CN  II-XII intact.  PSYCH: normal mood. Good eye contact   Assessment & Plan:   Problem List Items Addressed This Visit       Cardiovascular and Mediastinum   Primary hypertension - Primary   Relevant Medications   amLODipine (NORVASC) 2.5 MG tablet   atorvastatin (LIPITOR) 10 MG tablet   carvedilol (COREG) 25 MG tablet   furosemide (LASIX) 40 MG tablet   Other Relevant Orders   Comprehensive metabolic panel   TSH   CBC with Differential/Platelet     Respiratory   Eosinophilic asthma   Relevant Medications   Benralizumab (FASENRA PEN) 30 MG/ML SOAJ   fluticasone-salmeterol (ADVAIR) 250-50 MCG/ACT AEPB   Tiotropium Bromide Monohydrate (SPIRIVA RESPIMAT) 1.25 MCG/ACT AERS   albuterol (PROVENTIL HFA) 108 (90 Base) MCG/ACT inhaler   Vocal cord cancer (HCC)   Relevant Medications   ALPRAZolam (XANAX) 0.5 MG tablet   ondansetron (ZOFRAN) 4 MG tablet     Digestive   Gastroesophageal reflux disease without esophagitis   Relevant Medications   ondansetron (ZOFRAN) 4 MG tablet   famotidine (PEPCID) 40 MG tablet   Other Relevant Orders   Vitamin B12   Magnesium     Musculoskeletal and Integument   Localized osteoporosis without current pathological fracture   Relevant Medications   Vitamin D, Ergocalciferol, 50 MCG (2000 UT) CAPS   Other Relevant Orders   TSH   VITAMIN D 25 Hydroxy (Vit-D Deficiency, Fractures)   DG BONE DENSITY (DXA)     Genitourinary   OAB (overactive bladder)     Other   Primary insomnia   Spinal stenosis of lumbar region with neurogenic claudication   Other Visit Diagnoses     High risk medication use       Relevant Orders   Comprehensive metabolic panel   TSH   Vitamin B12   VITAMIN D 25 Hydroxy (Vit-D Deficiency, Fractures)   Magnesium   CBC with Differential/Platelet   Hyponatremia         1.  Hypertension-chronic.  Well-controlled.  Continue amlodipine 2.5 mg, Lasix 40 mg, carvedilol 25 mg twice daily.  Check CBC, CMP, TSH.  Patient  requesting referral to cardiology.  Her problem list includes CHF, however patient states she has never had CHF. 2.  Eosinophilic asthma-chronic.  Well-controlled on Fasenra, Advair 250/50, Spiriva, albuterol.  Refer to pulmonary for management of Fasenra. 3.  History of vocal cord  carcinoma-chronic.  Controlled.  Needs to get in with ENT for surveillance.  She will be setting that up, however will let us know if she needs a referral. 4.  GERD-chronic.  Well-controlled.  On PPI more for prevention and flaring of her vocal cords.  She did have profound hypomagnesia on PPI.  She would like labs checked-Will check CBC, magnesium, B12, electrolytes.  Will trial Pepcid 40 mg daily as an alternative. 5.  History of hyponatremia-check BMP 6.  Osteoporosis-has had intermittent Reclast.  It has been 2 years since her last bone density.  We will order a DEXA.  Will refer to rheumatology for request management. 7.  Overactive bladder-chronic.  Controlled on Myrbetriq 50 mg.  Continue. 8.  Insomnia-chronic.  Controlled on Xanax 0.5 mg nightly.  She has tried multiple other medications.  Has seen psych.  This is what works.  She will call when she needs a refill as she just got refills. 9.  Spinal stenosis-chronic.  Has had surgery in the past.  Using a cane.  Cannot walk far.  Requesting handicap form-this was given to her.  Follow-up in 6 months referrals  Outpatient Encounter Medications as of 05/25/2022  Medication Sig   albuterol (PROVENTIL HFA) 108 (90 Base) MCG/ACT inhaler Inhale 1-2 puffs into the lungs every 6 (six) hours as needed for wheezing or shortness of breath.   ALPRAZolam (XANAX) 0.5 MG tablet TAKE 1 TABLET(S) EVERY DAY AS NEEDED FOR 30 DAYS.   amLODipine (NORVASC) 2.5 MG tablet    atorvastatin (LIPITOR) 10 MG tablet Take by mouth.   Benralizumab (FASENRA PEN) 30 MG/ML SOAJ Inject into the skin.   carvedilol (COREG) 25 MG tablet carvedilol 25 mg tablet  ONE BY MOUTH TWICE DAILY   famotidine  (PEPCID) 40 MG tablet Take 1 tablet (40 mg total) by mouth daily.   fluticasone-salmeterol (ADVAIR) 250-50 MCG/ACT AEPB Inhale into the lungs.   furosemide (LASIX) 40 MG tablet ONE BY MOUTH DAILY   MAGNESIUM CITRATE PO Take 1 tablet by mouth daily.   ondansetron (ZOFRAN) 4 MG tablet Take by mouth.   Tiotropium Bromide Monohydrate (SPIRIVA RESPIMAT) 1.25 MCG/ACT AERS one time daily. .   Vitamin D, Ergocalciferol, 50 MCG (2000 UT) CAPS Take by mouth.   [DISCONTINUED] mirabegron ER (MYRBETRIQ) 50 MG TB24 tablet Take 50 mg by mouth daily.   mirabegron ER (MYRBETRIQ) 50 MG TB24 tablet Take 1 tablet (50 mg total) by mouth daily.   [DISCONTINUED] aspirin EC 81 MG tablet Take 81 mg by mouth daily.   [DISCONTINUED] atorvastatin (LIPITOR) 10 MG tablet Take 10 mg by mouth at bedtime.   [DISCONTINUED] escitalopram (LEXAPRO) 10 MG tablet Take 10 mg by mouth every morning.   [DISCONTINUED] furosemide (LASIX) 20 MG tablet Take 20 mg by mouth daily.   [DISCONTINUED] irbesartan (AVAPRO) 150 MG tablet Take 150 mg by mouth daily.   [DISCONTINUED] lansoprazole (PREVACID) 30 MG capsule Take 30 mg by mouth daily.   [DISCONTINUED] metoprolol tartrate (LOPRESSOR) 50 MG tablet Take 50 mg by mouth 2 (two) times daily.   [DISCONTINUED] ondansetron (ZOFRAN ODT) 4 MG disintegrating tablet Take 1 tablet (4 mg total) by mouth every 8 (eight) hours as needed for nausea or vomiting.   [DISCONTINUED] sodium chloride 1 g tablet Take 1 g by mouth 2 (two) times daily.   No facility-administered encounter medications on file as of 05/25/2022.    Follow-up: Return in about 6 months (around 11/23/2022) for htn.   Wellington Hampshire, MD

## 2022-05-25 NOTE — Patient Instructions (Signed)
Welcome to [facility]! It was a pleasure meeting you today.  [phone] for mammogram, other studies [phone]?  Get shingrix and high dose flu at pharmacy  As discussed, Please schedule a 6 month follow up visit today.  PLEASE NOTE:  If you had any LAB tests please let us know if you have not heard back within a few days. You may see your results on MyChart before we have a chance to review them but we will give you a call once they are reviewed by [place]. If we ordered any REFERRALS today, please let us know if you have not heard from their office within the next week.  Let us know through MyChart if you are needing REFILLS, or have your pharmacy send [place] the request. You can also use MyChart to communicate with me or any office staff.  Please try these tips to maintain a healthy lifestyle:  Eat most of your calories during the day when you are active. Eliminate processed foods including packaged sweets (pies, cakes, cookies), reduce intake of potatoes, white bread, white pasta, and white rice. Look for whole grain options, oat flour or almond flour.  Each meal should contain half fruits/vegetables, one quarter protein, and one quarter carbs (no bigger than a computer mouse).  Cut down on sweet beverages. This includes juice, soda, and sweet tea. Also watch fruit intake, though this is a healthier sweet option, it still contains natural sugar! Limit to 3 servings daily.  Drink at least 1 glass of water with each meal and aim for at least 8 glasses per day  Exercise at least 150 minutes every week.

## 2022-05-26 LAB — CBC WITH DIFFERENTIAL/PLATELET
Basophils Absolute: 0.1 10*3/uL (ref 0.0–0.1)
Basophils Relative: 0.9 % (ref 0.0–3.0)
Eosinophils Absolute: 0 10*3/uL (ref 0.0–0.7)
Eosinophils Relative: 0 % (ref 0.0–5.0)
HCT: 34.3 % — ABNORMAL LOW (ref 36.0–46.0)
Hemoglobin: 11.4 g/dL — ABNORMAL LOW (ref 12.0–15.0)
Lymphocytes Relative: 19.2 % (ref 12.0–46.0)
Lymphs Abs: 1.6 10*3/uL (ref 0.7–4.0)
MCHC: 33.1 g/dL (ref 30.0–36.0)
MCV: 92 fl (ref 78.0–100.0)
Monocytes Absolute: 0.7 10*3/uL (ref 0.1–1.0)
Monocytes Relative: 8.3 % (ref 3.0–12.0)
Neutro Abs: 5.8 10*3/uL (ref 1.4–7.7)
Neutrophils Relative %: 71.6 % (ref 43.0–77.0)
Platelets: 332 10*3/uL (ref 150.0–400.0)
RBC: 3.73 Mil/uL — ABNORMAL LOW (ref 3.87–5.11)
RDW: 13.7 % (ref 11.5–15.5)
WBC: 8.1 10*3/uL (ref 4.0–10.5)

## 2022-05-26 LAB — COMPREHENSIVE METABOLIC PANEL
ALT: 15 U/L (ref 0–35)
AST: 17 U/L (ref 0–37)
Albumin: 4.1 g/dL (ref 3.5–5.2)
Alkaline Phosphatase: 72 U/L (ref 39–117)
BUN: 19 mg/dL (ref 6–23)
CO2: 28 mEq/L (ref 19–32)
Calcium: 9.5 mg/dL (ref 8.4–10.5)
Chloride: 96 mEq/L (ref 96–112)
Creatinine, Ser: 0.95 mg/dL (ref 0.40–1.20)
GFR: 55.35 mL/min — ABNORMAL LOW (ref >60.00)
Glucose, Bld: 85 mg/dL (ref 70–99)
Potassium: 4.6 mEq/L (ref 3.5–5.1)
Sodium: 132 mEq/L — ABNORMAL LOW (ref 135–145)
Total Bilirubin: 0.4 mg/dL (ref 0.2–1.2)
Total Protein: 7.2 g/dL (ref 6.0–8.3)

## 2022-05-26 LAB — TSH: TSH: 2.78 u[IU]/mL (ref 0.35–5.50)

## 2022-05-26 LAB — MAGNESIUM: Magnesium: 1.4 mg/dL — ABNORMAL LOW (ref 1.5–2.5)

## 2022-05-26 LAB — VITAMIN D 25 HYDROXY (VIT D DEFICIENCY, FRACTURES): VITD: 56.62 ng/mL (ref 30.00–100.00)

## 2022-05-26 LAB — VITAMIN B12: Vitamin B-12: >1500 pg/mL — ABNORMAL HIGH (ref 211–911)

## 2022-05-28 NOTE — Progress Notes (Signed)
Magnesium is a little low low again-needs to start taking 250-'400mg'$  daily otc.  Also, sodium is a little low-ned to watc.  Hgb a little low-does she know what her baseline is?.   Repeat bmp/mg/cbc in 1 week.

## 2022-06-01 ENCOUNTER — Telehealth: Payer: Self-pay | Admitting: Family Medicine

## 2022-06-01 NOTE — Telephone Encounter (Signed)
Patient notified that Dr. Cherlynn Kaiser is aware and labs will be repeated at her neck office visit.

## 2022-06-01 NOTE — Telephone Encounter (Signed)
Patient does not feel its necessary for her to repeat blood work - states she is watching her labs closely and no need to repeat. She understands she is new and Dr Cherlynn Kaiser does not know her.   Would like a call back to explain -

## 2022-06-07 ENCOUNTER — Telehealth: Payer: Self-pay | Admitting: Family Medicine

## 2022-06-07 NOTE — Telephone Encounter (Signed)
Pt states: -Has an appointment with [facility] for DEXA body scan 09/19 at 1:00pm -she forgot to request the orders to be sent over to the location.  Pt acknowledges the request may not be handled in time. Pt declined to be scheduled through [facility] scheduling at [facility] imaging.   Pt requests: -DEXA body scan orders to be faxed to the following specialty:  [facility] [address], [place] [address]

## 2022-06-07 NOTE — Telephone Encounter (Signed)
Order printed and faxed to [facility].

## 2022-06-08 LAB — HM DEXA SCAN

## 2022-06-11 ENCOUNTER — Encounter: Payer: Self-pay | Admitting: Family Medicine

## 2022-06-29 ENCOUNTER — Institutional Professional Consult (permissible substitution): Payer: PRIVATE HEALTH INSURANCE | Admitting: Pulmonary Disease

## 2022-07-08 ENCOUNTER — Encounter: Payer: Self-pay | Admitting: Internal Medicine

## 2022-07-08 ENCOUNTER — Ambulatory Visit (INDEPENDENT_AMBULATORY_CARE_PROVIDER_SITE_OTHER): Payer: Medicare Other | Admitting: Internal Medicine

## 2022-07-08 VITALS — BP 130/60 | HR 67 | Ht 59.0 in | Wt 132.8 lb

## 2022-07-08 DIAGNOSIS — J4550 Severe persistent asthma, uncomplicated: Secondary | ICD-10-CM

## 2022-07-08 NOTE — Patient Instructions (Signed)
Please schedule follow up scheduled with myself in 6 months.  If my schedule is not open yet, we will contact you with a reminder closer to that time. Please call [phone] if you haven't heard from [place] a month before.   Welcome to [facility]! We will obtain the records from your previous pulmonologist.  Continue advair, spiriva, fasenra for now. Call us sooner if any issues or concerns with your breathing.

## 2022-07-08 NOTE — Progress Notes (Signed)
Julie Lutz    [id number]    Mar 15, 1941  Primary Care Physician:Kulik, Catalina Lunger, MD  Referring Physician: Tawnya Crook, MD [place],  [place] [address] Reason for Consultation: eosinophilic asthma Date of Consultation: 07/08/2022  Chief complaint:   Chief Complaint  Patient presents with   Consult    Establish care.     HPI: Julie Lutz is a 81 y.o. woman with history of tobacco use disorder and severe persistent asthma on fasenra here to establish care with me today. Has history of childhood asthma.  Has been on fasenra for about 2 years. Improved asthma control on this.   Has had a URI this week which is causing some increased dyspnea.  History of vocal cord cancer.   Current Regimen: advair 250 1 puff twice a day, spiriva once daily 1 puff daily, fasenra Asthma Triggers: URIs, exertion Exacerbations in the last year: none in the last year.  History of hospitalization or intubation:never Allergy Testing: yes  GERD: occasional, on pepcid.  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  ACT:  Asthma Control Test ACT Total Score  07/08/2022  1:09 PM 21   FeNO:  Social history:  Occupation: retired Marine scientist  Exposures: recently relocated to [place] from [place] to be closer to her daughters.  Smoking history: yes 1 ppd x 20 years, quit in [place] History   Not on file  Tobacco Use   Smoking status: Former    Types: Cigarettes    Quit date: 1990    Years since quitting: 33.8   Smokeless tobacco: Never  Vaping Use   Vaping Use: Not on file  Substance and Sexual Activity   Alcohol use: Yes    Alcohol/week: 5.0 standard drinks of alcohol    Types: 5 Glasses of wine per week    Comment: occasional   Drug use: Never   Sexual activity: Not Currently    Relevant family history:  Family History  Problem Relation Age of Onset   Diabetes Sister    Cancer Sister 75       ovarian    Anemia Sister    Allergies Daughter    Asthma Neg Hx     Past Medical History:  Diagnosis Date   Allergy    Arthritis    Asthma    esosinophillic   Cancer ([place])    vocal cord   Cataract    CHF (congestive heart failure) (HCC)    GERD (gastroesophageal reflux disease)    Hyperlipidemia    Hypertension    Hyponatremia    Osteoporosis    Spinal stenosis     Past Surgical History:  Procedure Laterality Date   FEMUR FRACTURE SURGERY Right 08/2016   HERNIA REPAIR Right    inguinal   JOINT REPLACEMENT  2017   bilateral knee   LAMINECTOMY  11/2020   L3-5   LARYNGOSCOPY  11/2021   SPINAL FUSION  03/2021     Physical Exam: Blood pressure 130/60, pulse 67, height '4\' 11"'$  (1.499 m), weight 132 lb 12.8 oz (60.2 kg), SpO2 96 %. Gen:      No acute distress ENT:  hoarse voice, no nasal polyps, mucus membranes moist Lungs:    No increased respiratory effort, symmetric chest wall excursion, clear to auscultation bilaterally, no wheezes or crackles CV:         mild kyphosis, Regular rate and rhythm;  no murmurs, rubs, or gallops.  No pedal edema Abd:      + bowel sounds; soft, non-tender; no distension MSK: no acute synovitis of DIP or PIP joints, no mechanics hands.  Skin:      Warm and dry; no rashes Neuro: normal speech, no focal facial asymmetry Psych: alert and oriented x3, normal mood and affect   Data Reviewed/Medical Decision Making:  Independent interpretation of tests: Imaging:   PFTs:   Labs:  Lab Results  Component Value Date   WBC 8.1 05/25/2022   HGB 11.4 (L) 05/25/2022   HCT 34.3 (L) 05/25/2022   MCV 92.0 05/25/2022   PLT 332.0 05/25/2022   Lab Results  Component Value Date   NA 132 (L) 05/25/2022   K 4.6 05/25/2022   CL 96 05/25/2022   CO2 28 05/25/2022      Immunization status:  Immunization History  Administered Date(s) Administered   [employer] Vaccination 10/17/2019, 11/14/2019     I reviewed prior external note(s) from  primary care  I reviewed the result(s) of the labs and imaging as noted above.   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  Will need to obtain records from her previous asthma provider in [place] for continuation of care.   We discussed disease management and progression at length today.     Return to Care: Return in about 6 months (around 01/07/2023).  Julie Llamas, MD [facility] [place]  CC: Julie Crook, MD

## 2022-07-09 ENCOUNTER — Telehealth: Payer: Self-pay | Admitting: Internal Medicine

## 2022-07-12 MED ORDER — ALBUTEROL SULFATE (2.5 MG/3ML) 0.083% IN NEBU: 2.5000 mg | INHALATION_SOLUTION | Freq: Four times a day (QID) | RESPIRATORY_TRACT | 12 refills | Status: DC | PRN

## 2022-07-12 NOTE — Telephone Encounter (Signed)
Called patient and verified that she was needing her albuterol neb solution refilled. Neb solution sent [facility] to [facility] preferred. Nothing further needed

## 2022-07-15 ENCOUNTER — Ambulatory Visit (INDEPENDENT_AMBULATORY_CARE_PROVIDER_SITE_OTHER): Payer: Medicare Other | Admitting: Family

## 2022-07-15 ENCOUNTER — Encounter: Payer: Self-pay | Admitting: Family

## 2022-07-15 VITALS — BP 129/71 | HR 77 | Temp 98.4°F | Ht 59.0 in | Wt 130.6 lb

## 2022-07-15 DIAGNOSIS — J440 Chronic obstructive pulmonary disease with acute lower respiratory infection: Secondary | ICD-10-CM

## 2022-07-15 DIAGNOSIS — J209 Acute bronchitis, unspecified: Secondary | ICD-10-CM | POA: Diagnosis not present

## 2022-07-15 DIAGNOSIS — U071 COVID-19: Secondary | ICD-10-CM

## 2022-07-15 LAB — POC COVID19 BINAXNOW: SARS Coronavirus 2 Ag: NEGATIVE

## 2022-07-15 MED ORDER — PREDNISONE 20 MG PO TABS: ORAL_TABLET | ORAL | 0 refills | Status: DC

## 2022-07-15 MED ORDER — AMOXICILLIN-POT CLAVULANATE 875-125 MG PO TABS: 1.0000 | ORAL_TABLET | Freq: Two times a day (BID) | ORAL | 0 refills | Status: DC

## 2022-07-15 NOTE — Progress Notes (Signed)
Patient ID: Julie Lutz, female    DOB: 04/17/1941, 81 y.o.   MRN: [id number]  Chief Complaint  Patient presents with   Cough    Pt c/o cough with yellow mucus, headache, nausea and congestion. Present for 2 weeks.     HPI:      URI sx:  sx for 2 weeks, denies fever, has had headache & sinus sx, did not test for covid, reports having hx of bronchitis, has chronic asthma and uses Spiriva bid, albuterol nebulizer used bid other day, yesterday just 1 time.   Assessment & Plan:  1. 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 Call back if sx are not improving.  - amoxicillin-clavulanate (AUGMENTIN) 875-125 MG tablet; Take 1 tablet by mouth 2 (two) times daily.  Dispense: 10 tablet; Refill: 0 - predniSONE (DELTASONE) 20 MG tablet; Take 2 pills in the morning with breakfast for 3 days, then 1 pill for 2 days  Dispense: 8 tablet; Refill: 0  Subjective:    Outpatient Medications Prior to Visit  Medication Sig Dispense Refill   albuterol (PROVENTIL HFA) 108 (90 Base) MCG/ACT inhaler Inhale 1-2 puffs into the lungs every 6 (six) hours as needed for wheezing or shortness of breath.     albuterol (PROVENTIL) (2.5 MG/3ML) 0.083% nebulizer solution Take 3 mLs (2.5 mg total) by nebulization every 6 (six) hours as needed for wheezing or shortness of breath. 75 mL 12   ALPRAZolam (XANAX) 0.5 MG tablet TAKE 1 TABLET(S) EVERY DAY AS NEEDED FOR 30 DAYS.     amLODipine (NORVASC) 2.5 MG tablet      atorvastatin (LIPITOR) 10 MG tablet Take by mouth.     Benralizumab (FASENRA PEN) 30 MG/ML SOAJ Inject into the skin.     carvedilol (COREG) 25 MG tablet carvedilol 25 mg tablet  ONE BY MOUTH TWICE DAILY     famotidine (PEPCID) 40 MG tablet Take 1 tablet (40 mg total) by mouth daily. 90  tablet 3   fluticasone-salmeterol (ADVAIR) 250-50 MCG/ACT AEPB Inhale into the lungs.     furosemide (LASIX) 40 MG tablet ONE BY MOUTH DAILY     MAGNESIUM CITRATE PO Take 1 tablet by mouth daily.     mirabegron ER (MYRBETRIQ) 50 MG TB24 tablet Take 1 tablet (50 mg total) by mouth daily. 90 tablet 3   ondansetron (ZOFRAN) 4 MG tablet Take by mouth.     Tiotropium Bromide Monohydrate (SPIRIVA RESPIMAT) 1.25 MCG/ACT AERS one time daily. .     Vitamin D, Ergocalciferol, 50 MCG (2000 UT) CAPS Take by mouth.     No facility-administered medications prior to visit.   Past Medical History:  Diagnosis Date   Allergy    Arthritis    Asthma    esosinophillic   Cancer ([place])    vocal cord   Cataract    CHF (congestive heart failure) (HCC)    GERD (gastroesophageal reflux disease)    Hyperlipidemia    Hypertension    Hyponatremia    Osteoporosis    Spinal stenosis    Past Surgical History:  Procedure Laterality Date   FEMUR FRACTURE SURGERY Right 08/2016   HERNIA REPAIR Right    inguinal   JOINT REPLACEMENT  2017   bilateral knee   LAMINECTOMY  11/2020   L3-5   LARYNGOSCOPY  11/2021   SPINAL FUSION  03/2021   No Known Allergies    Objective:    Physical Exam Vitals and nursing note reviewed.  Constitutional:      Appearance: Normal appearance. She is not ill-appearing.     Interventions: Face mask in place.  HENT:     Right Ear: Tympanic membrane and ear canal normal.     Left Ear: Tympanic membrane and ear canal normal.     Nose:     Right Sinus: Frontal sinus tenderness present.     Left Sinus: Frontal sinus tenderness present.     Mouth/Throat:     Mouth: Mucous membranes are moist.     Pharynx: Posterior oropharyngeal erythema (mild) present. No pharyngeal swelling, oropharyngeal exudate or uvula swelling.     Tonsils: No tonsillar exudate or tonsillar abscesses.  Cardiovascular:     Rate and Rhythm: Normal rate and regular rhythm.  Pulmonary:     Effort: Pulmonary  effort is normal.     Breath sounds: Normal breath sounds.  Musculoskeletal:        General: Normal range of motion.  Lymphadenopathy:     Head:     Right side of head: No preauricular or posterior auricular adenopathy.     Left side of head: No preauricular or posterior auricular adenopathy.     Cervical: No cervical adenopathy.  Skin:    General: Skin is warm and dry.  Neurological:     Mental Status: She is alert.  Psychiatric:        Mood and Affect: Mood normal.        Behavior: Behavior normal.    BP 129/71 (BP Location: Left Arm, Patient Position: Sitting, Cuff Size: Large)   Pulse 77   Temp 98.4 F (36.9 C) (Temporal)   Ht '4\' 11"'$  (1.499 m)   Wt 130 lb 9.6 oz (59.2 kg)   SpO2 96%   BMI 26.38 kg/m  Wt Readings from Last 3 Encounters:  07/15/22 130 lb 9.6 oz (59.2 kg)  07/08/22 132 lb 12.8 oz (60.2 kg)  05/25/22 135 lb 8 oz (61.5 kg)      Jeanie Sewer, NP

## 2022-07-16 ENCOUNTER — Other Ambulatory Visit (HOSPITAL_COMMUNITY): Payer: Self-pay

## 2022-07-16 NOTE — Telephone Encounter (Signed)
Spoke with pt who states she tried to get a refill of her albuterol solution and she was told that the med was on hold. Stated to pt that I would call [facility] to see what was going on.  Called [facility] about pt's Rx and was told that the med was requiring a PA. Called pt back to let her know this and told her that we would send this to prior auth team for review.  Routing to prior auth team as high priority as pt really needs a refill of med ASAP. Please advise.

## 2022-07-20 ENCOUNTER — Other Ambulatory Visit (HOSPITAL_COMMUNITY): Payer: Self-pay

## 2022-07-20 NOTE — Telephone Encounter (Signed)
Per benefits investigation, test claim shows PA not needed for albuterol nebulizer solution. Previous claim was being processed for an 18 day supply for 30m which is incorrect. Tried [facility] listed pharmacy and could not get through to process a refill.

## 2022-07-27 NOTE — Progress Notes (Deleted)
Cardiology Office Note:    Date:  07/27/2022   ID:  Julie Lutz, DOB 1941-05-27, MRN [id number]  PCP:  Tawnya Crook, MD   [place] Providers Cardiologist:  None { Click to update primary MD,subspecialty MD or APP then REFRESH:1}    Referring MD: Tawnya Crook, MD   No chief complaint on file. ***  History of Present Illness:    Julie Lutz is a 81 y.o. 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     Allergies:   Patient has no known allergies.   Social History   Socioeconomic History   Marital status: Widowed    Spouse name: Not on file   Number of children: 2   Years of education: Not on file   Highest education level: Not on file  Occupational History   Not on file  Tobacco Use   Smoking status: Former    Types: Cigarettes    Quit date: 1990    Years since quitting: 33.8   Smokeless tobacco: Never  Vaping Use   Vaping Use: Not on file  Substance and Sexual Activity   Alcohol use: Yes    Alcohol/week: 5.0 standard drinks of alcohol    Types: 5 Glasses of wine per week    Comment: occasional   Drug use: Never   Sexual activity: Not Currently  Other Topics Concern   Not on file  Social History Narrative   2 grands   Retired Therapist, sports   Social  Determinants of Radio broadcast assistant Strain: Not on file  Food Insecurity: Not on file  Transportation Needs: Not on file  Physical Activity: Not on file  Stress: Not on file  Social Connections: Not on file     Family History: The patient's ***family history includes Allergies in her daughter; Anemia in her sister; Cancer (age of onset: 63) in her sister; Diabetes in her sister. There is no history of Asthma.  ROS:   Please see the history of present illness.    *** All other systems reviewed and are negative.  EKGs/Labs/Other Studies Reviewed:    The following studies were reviewed today: ***  EKG:  EKG is *** ordered today.  The ekg ordered today demonstrates ***  Recent Labs: 05/25/2022: ALT 15; BUN 19; Creatinine, Ser 0.95; Hemoglobin 11.4; Magnesium 1.4; Platelets 332.0; Potassium 4.6; Sodium 132; TSH 2.78  Recent Lipid Panel No results found for: "CHOL", "TRIG", "HDL", "CHOLHDL", "VLDL", "LDLCALC", "LDLDIRECT"   Risk Assessment/Calculations:   {Does this patient have ATRIAL FIBRILLATION?[phone]}  No BP recorded.  {Refresh Note OR Click here to enter BP  :1}***  Physical Exam:    VS:  There were no vitals taken for this visit.    Wt Readings from Last 3 Encounters:  07/15/22 130 lb 9.6 oz (59.2 kg)  07/08/22 132 lb 12.8 oz (60.2 kg)  05/25/22 135 lb 8 oz (61.5 kg)     GEN: *** Well nourished, well developed in no acute distress HEENT: Normal NECK: No JVD; No carotid bruits LYMPHATICS: No lymphadenopathy CARDIAC: ***RRR, no murmurs, rubs, gallops RESPIRATORY:  Clear to auscultation without rales, wheezing or rhonchi  ABDOMEN: Soft, non-tender, non-distended MUSCULOSKELETAL:  No edema; No deformity  SKIN: Warm and dry NEUROLOGIC:  Alert and oriented x 3 PSYCHIATRIC:  Normal affect   ASSESSMENT:    No diagnosis found. PLAN:    In order of problems listed above:  ***      {Are you ordering a CV Procedure (e.g. stress test, cath,  DCCV, TEE, etc)?   Press F2        :[id number]}    Medication Adjustments/Labs and Tests Ordered: Current medicines are reviewed at length with the patient today.  Concerns regarding medicines are outlined above.  No orders of the defined types were placed in this encounter.  No orders of the defined types were placed in this encounter.   There are no Patient Instructions on file for this visit.   Signed, Freada Bergeron, MD  07/27/2022 8:41 PM    [place]

## 2022-07-29 ENCOUNTER — Encounter: Payer: Self-pay | Admitting: Cardiology

## 2022-07-29 ENCOUNTER — Ambulatory Visit: Payer: Medicare Other | Attending: Cardiology | Admitting: Cardiology

## 2022-07-29 VITALS — BP 132/58 | HR 77 | Ht 59.0 in | Wt 134.0 lb

## 2022-07-29 DIAGNOSIS — R600 Localized edema: Secondary | ICD-10-CM | POA: Insufficient documentation

## 2022-07-29 DIAGNOSIS — Z79899 Other long term (current) drug therapy: Secondary | ICD-10-CM | POA: Diagnosis present

## 2022-07-29 DIAGNOSIS — D649 Anemia, unspecified: Secondary | ICD-10-CM | POA: Diagnosis present

## 2022-07-29 DIAGNOSIS — E7800 Pure hypercholesterolemia, unspecified: Secondary | ICD-10-CM | POA: Diagnosis present

## 2022-07-29 DIAGNOSIS — E871 Hypo-osmolality and hyponatremia: Secondary | ICD-10-CM | POA: Insufficient documentation

## 2022-07-29 DIAGNOSIS — I10 Essential (primary) hypertension: Secondary | ICD-10-CM | POA: Insufficient documentation

## 2022-07-29 MED ORDER — FUROSEMIDE 20 MG PO TABS: 20.0000 mg | ORAL_TABLET | ORAL | 2 refills | Status: DC

## 2022-07-29 NOTE — Progress Notes (Signed)
Cardiology Office Note:    Date:  07/29/2022   ID:  Julie Lutz, DOB 09/10/1941, MRN [id number]  PCP:  Tawnya Crook, MD   [facility] Providers Cardiologist:  None {  Referring MD: Tawnya Crook, MD    History of Present Illness:    Julie Lutz is a 81 y.o. female with a hx of HTN, HLD, asthma and vocal cord cancer who was referred by Dr. Cherlynn Kaiser for further evaluation of HTN.  Patient seen by Dr. Cherlynn Kaiser on 05/2022. Note reviewed. Requested to follow with Cardiology as had been followed with Cardiology in [place].  Today, she is accompanied by a family member. The patient confirms that she is primarily here to establish care and for preventative evaluation.   Years ago she had issues with PVCs, but this is stable today. She denies anginal symptoms.  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   For her hypertension she is on carvedilol. In clinic today her blood pressure is 132/58. At home her readings are usually controlled. She is taking Lipitor for cholesterol management.  She states that at baseline she has low sodium, low hemoglobin, and low magnesium. She takes her 400 mg magnesium supplement daily. Previously on Prevacid (now on pepcid). Also she endorses chronic anemia.   Of note, she is also taking 40 mg Lasix daily. Normally she doesn't notice swelling in her legs unless she flies in an airplane. No known heart failure.  She denies any chest pain, or shortness of breath. No lightheadedness, headaches, syncope, orthopnea, or PND.   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   Cataract    CHF (congestive heart failure) (HCC)    GERD (gastroesophageal reflux disease)    Hyperlipidemia    Hypertension    Hyponatremia    Osteoporosis    Spinal stenosis     Past Surgical History:  Procedure Laterality Date    FEMUR FRACTURE SURGERY Right 08/2016   HERNIA REPAIR Right    inguinal   JOINT REPLACEMENT  2017   bilateral knee   LAMINECTOMY  11/2020   L3-5   LARYNGOSCOPY  11/2021   SPINAL FUSION  03/2021    Current Medications: Current Meds  Medication Sig   albuterol (PROVENTIL HFA) 108 (90 Base) MCG/ACT inhaler Inhale 1-2 puffs into the lungs every 6 (six) hours as needed for wheezing or shortness of breath.   albuterol (PROVENTIL) (2.5 MG/3ML) 0.083% nebulizer solution Take 3 mLs (2.5 mg total) by nebulization every 6 (six) hours as needed for wheezing or shortness of breath.   ALPRAZolam (XANAX) 0.5 MG tablet TAKE 1 TABLET(S) EVERY DAY AS NEEDED FOR 30 DAYS.   amLODipine (NORVASC) 2.5 MG tablet    atorvastatin (LIPITOR) 10 MG tablet Take by mouth.   Benralizumab (FASENRA PEN) 30 MG/ML SOAJ Inject into the skin.   carvedilol (COREG) 25 MG tablet carvedilol 25 mg tablet  ONE BY MOUTH TWICE DAILY   famotidine (PEPCID) 40 MG tablet Take 1 tablet (40 mg total) by mouth daily.   fluticasone-salmeterol (ADVAIR) 250-50 MCG/ACT AEPB Inhale into the lungs.   [START ON 07/30/2022] furosemide (LASIX) 20 MG tablet Take 1 tablet (20 mg total) by mouth 3 (three) times a week.   MAGNESIUM CITRATE PO Take 1 tablet by mouth daily.  mirabegron ER (MYRBETRIQ) 50 MG TB24 tablet Take 1 tablet (50 mg total) by mouth daily.   ondansetron (ZOFRAN) 4 MG tablet Take by mouth.   Tiotropium Bromide Monohydrate (SPIRIVA RESPIMAT) 1.25 MCG/ACT AERS one time daily. .   Vitamin D, Ergocalciferol, 50 MCG (2000 UT) CAPS Take by mouth.   [DISCONTINUED] furosemide (LASIX) 40 MG tablet ONE BY MOUTH DAILY     Allergies:   Patient has no known allergies.   Social History   Socioeconomic History   Marital status: Widowed    Spouse name: Not on file   Number of children: 2   Years of education: Not on file   Highest education level: Not on file  Occupational History   Not on file  Tobacco Use   Smoking status:  Former    Types: Cigarettes    Quit date: 1990    Years since quitting: 33.8   Smokeless tobacco: Never  Vaping Use   Vaping Use: Not on file  Substance and Sexual Activity   Alcohol use: Yes    Alcohol/week: 5.0 standard drinks of alcohol    Types: 5 Glasses of wine per week    Comment: occasional   Drug use: Never   Sexual activity: Not Currently  Other Topics Concern   Not on file  Social History Narrative   2 grands   Retired Therapist, sports   Social Determinants of Radio broadcast assistant Strain: Not on file  Food Insecurity: Not on file  Transportation Needs: Not on file  Physical Activity: Not on file  Stress: Not on file  Social Connections: Not on file     Family History: The patient's family history includes Allergies in her daughter; Anemia in her sister; Cancer (age of onset: 46) in her sister; Diabetes in her sister. There is no history of Asthma.  ROS:   Review of Systems  Constitutional:  Negative for chills and fever.  HENT:  Negative for nosebleeds.   Eyes:  Negative for double vision and discharge.  Respiratory:  Negative for cough and sputum production.   Cardiovascular:  Negative for chest pain, palpitations, orthopnea, claudication, leg swelling and PND.  Gastrointestinal:  Negative for nausea and vomiting.  Genitourinary:  Negative for frequency.  Musculoskeletal:  Negative for falls.  Neurological:  Negative for dizziness and tingling.  Psychiatric/Behavioral:  Negative for depression.      EKGs/Labs/Other Studies Reviewed:    The following studies were reviewed today:  No prior cardiovascular studies available.  EKG:  EKG is personally reviewed. 07/29/2022: Sinus rhythm. First degree AV block. Low voltage. Rate 76 bpm.  Recent Labs: 05/25/2022: ALT 15; BUN 19; Creatinine, Ser 0.95; Hemoglobin 11.4; Magnesium 1.4; Platelets 332.0; Potassium 4.6; Sodium 132; TSH 2.78   Recent Lipid Panel No results found for: "CHOL", "TRIG", "HDL", "CHOLHDL",  "VLDL", "LDLCALC", "LDLDIRECT"   Risk Assessment/Calculations:                Physical Exam:    VS:  BP (!) 132/58   Pulse 77   Ht '4\' 11"'$  (1.499 m)   Wt 134 lb (60.8 kg)   SpO2 97%   BMI 27.06 kg/m     Wt Readings from Last 3 Encounters:  07/29/22 134 lb (60.8 kg)  07/15/22 130 lb 9.6 oz (59.2 kg)  07/08/22 132 lb 12.8 oz (60.2 kg)     GEN: Well nourished, well developed in no acute distress HEENT: Normal NECK: No JVD; No carotid bruits CARDIAC: RRR, no murmurs,  rubs, gallops RESPIRATORY:  Clear to auscultation without rales, wheezing or rhonchi  ABDOMEN: Soft, non-tender, non-distended MUSCULOSKELETAL:  No edema; No deformity  SKIN: Warm and dry NEUROLOGIC:  Alert and oriented x 3 PSYCHIATRIC:  Normal affect   ASSESSMENT:    1. Primary hypertension   2. Medication management   3. Hyponatremia   4. Hypomagnesemia   5. Anemia, unspecified type   6. Leg edema   7. Pure hypercholesterolemia    PLAN:    In order of problems listed above:  #HTN: Well controlled and at goal <130/90s. -Continue amlodipine 2.'5mg'$  daily -Continue coreg '25mg'$  BID  #LE Edema: Patient very knowledgeable about her history. No known diagnosis of HF. Was placed on lasix after episode of swelling but does not currently swell. Will decrease to '20mg'$  3x/week as likely daily lasix is contributing to her low Na and Mg.  -Decrease lasix to '20mg'$  M,W,F -Check BMET and Mg in 2 weeks to ensure Mg and Na better  #HLD: -Continue lipitor '10mg'$  daily -Monitored by PCP; no known CAD  #Asthma: Follows with Pulm.  #HypoMg: #HypoNa: Suspect this may be related to lasix daily dosing. She is currently on Mg supplementation. Last level was low at 1.4. Discussed increasing her Mg to BID dosing and will back off lasix as above.  #Chronic Anemia: Stable with no symptoms of bleeding. -Trend CBC         Follow-up:  6 months.  Medication Adjustments/Labs and Tests Ordered: Current medicines are  reviewed at length with the patient today.  Concerns regarding medicines are outlined above.   Orders Placed This Encounter  Procedures   Basic metabolic panel   Magnesium   CBC w/Diff   EKG 12-Lead   Meds ordered this encounter  Medications   furosemide (LASIX) 20 MG tablet    Sig: Take 1 tablet (20 mg total) by mouth 3 (three) times a week.    Dispense:  45 tablet    Refill:  2   Patient Instructions  Medication Instructions:   DECREASE YOUR LASIX TO 20 MG BY MOUTH THREE TIMES WEEKLY  *If you need a refill on your cardiac medications before your next appointment, please call your pharmacy*   Lab Work:  IN 3 WEEKS HERE IN THE OFFICE--BMET, MAGNESIUM LEVEL, AND CBC W DIFF  If you have labs (blood work) drawn today and your tests are completely normal, you will receive your results only by: [place] (if you have MyChart) OR A paper copy in the mail If you have any lab test that is abnormal or we need to change your treatment, we will call you to review the results.    Follow-Up: At [facility], you and your health needs are our priority.  As part of our continuing mission to provide you with exceptional heart care, we have created designated Provider Care Teams.  These Care Teams include your primary Cardiologist (physician) and Advanced Practice Providers (APPs -  Physician Assistants and Nurse Practitioners) who all work together to provide you with the care you need, when you need it.  We recommend signing up for the patient portal called "MyChart".  Sign up information is provided on this After Visit Summary.  MyChart is used to connect with patients for Virtual Visits (Telemedicine).  Patients are able to view lab/test results, encounter notes, upcoming appointments, etc.  Non-urgent messages can be sent to your provider as well.   To learn more about what you can do with MyChart, go  to [email or web address].    Your next appointment:   6  month(s)  The format for your next appointment:   In Person  Provider:   DR. Johney Frame  Important Information About Sugar         I,Mathew Stumpf,acting as a scribe for Freada Bergeron, MD.,have documented all relevant documentation on the behalf of Freada Bergeron, MD,as directed by  Freada Bergeron, MD while in the presence of Freada Bergeron, MD.  I, Freada Bergeron, MD, have reviewed all documentation for this visit. The documentation on 07/29/22 for the exam, diagnosis, procedures, and orders are all accurate and complete.   Signed, Freada Bergeron, MD  07/29/2022 2:37 PM    [place]

## 2022-07-29 NOTE — Patient Instructions (Signed)
Medication Instructions:   DECREASE YOUR LASIX TO 20 MG BY MOUTH THREE TIMES WEEKLY  *If you need a refill on your cardiac medications before your next appointment, please call your pharmacy*   Lab Work:  IN 3 WEEKS HERE IN THE OFFICE--BMET, MAGNESIUM LEVEL, AND CBC W DIFF  If you have labs (blood work) drawn today and your tests are completely normal, you will receive your results only by: [place] (if you have MyChart) OR A paper copy in the mail If you have any lab test that is abnormal or we need to change your treatment, we will call you to review the results.    Follow-Up: At [facility], you and your health needs are our priority.  As part of our continuing mission to provide you with exceptional heart care, we have created designated Provider Care Teams.  These Care Teams include your primary Cardiologist (physician) and Advanced Practice Providers (APPs -  Physician Assistants and Nurse Practitioners) who all work together to provide you with the care you need, when you need it.  We recommend signing up for the patient portal called "MyChart".  Sign up information is provided on this After Visit Summary.  MyChart is used to connect with patients for Virtual Visits (Telemedicine).  Patients are able to view lab/test results, encounter notes, upcoming appointments, etc.  Non-urgent messages can be sent to your provider as well.   To learn more about what you can do with MyChart, go to [email or web address].    Your next appointment:   6 month(s)  The format for your next appointment:   In Person  Provider:   DR. Johney Frame  Important Information About Sugar

## 2022-07-30 ENCOUNTER — Other Ambulatory Visit: Payer: Self-pay | Admitting: *Deleted

## 2022-07-30 MED ORDER — ALBUTEROL SULFATE (2.5 MG/3ML) 0.083% IN NEBU: 2.5000 mg | INHALATION_SOLUTION | Freq: Four times a day (QID) | RESPIRATORY_TRACT | 12 refills | Status: AC | PRN

## 2022-08-03 ENCOUNTER — Other Ambulatory Visit: Payer: Self-pay | Admitting: Family Medicine

## 2022-08-03 ENCOUNTER — Telehealth: Payer: Self-pay | Admitting: Family Medicine

## 2022-08-03 MED ORDER — ALPRAZOLAM 0.5 MG PO TABS: 0.5000 mg | ORAL_TABLET | Freq: Every evening | ORAL | 1 refills | Status: DC | PRN

## 2022-08-03 NOTE — Telephone Encounter (Signed)
Pt states: -previous provider was able to write script for more than 30 day supply. -Would like script to be written for more than 30 day supply     LAST APPOINTMENT DATE:   05/25/22 NP with PCP  NEXT APPOINTMENT DATE: 11/23/22 OV with PCP  MEDICATION: ALPRAZolam (XANAX) 0.5 MG tablet [[id number]]   Is the patient out of medication?  Has 7 days left   PHARMACY: [id number] [place] [address], [place]Phone: [phone] Fax: [phone] #: [id number]

## 2022-08-03 NOTE — Telephone Encounter (Signed)
Please review message below and advise.

## 2022-08-04 NOTE — Telephone Encounter (Signed)
Rx sent to the pharmacy by pcp.

## 2022-08-19 ENCOUNTER — Ambulatory Visit: Payer: Medicare Other | Attending: Cardiology

## 2022-08-19 ENCOUNTER — Other Ambulatory Visit: Payer: Self-pay | Admitting: *Deleted

## 2022-08-19 DIAGNOSIS — E871 Hypo-osmolality and hyponatremia: Secondary | ICD-10-CM

## 2022-08-19 DIAGNOSIS — Z79899 Other long term (current) drug therapy: Secondary | ICD-10-CM

## 2022-08-19 DIAGNOSIS — D649 Anemia, unspecified: Secondary | ICD-10-CM

## 2022-08-19 MED ORDER — FLUTICASONE-SALMETEROL 250-50 MCG/ACT IN AEPB: 1.0000 | INHALATION_SPRAY | Freq: Two times a day (BID) | RESPIRATORY_TRACT | 3 refills | Status: DC

## 2022-08-20 ENCOUNTER — Telehealth: Payer: Self-pay | Admitting: *Deleted

## 2022-08-20 LAB — CBC WITH DIFFERENTIAL/PLATELET
Basophils Absolute: 0 10*3/uL (ref 0.0–0.2)
Basos: 0 %
EOS (ABSOLUTE): 0 10*3/uL (ref 0.0–0.4)
Eos: 0 %
Hematocrit: 32.4 % — ABNORMAL LOW (ref 34.0–46.6)
Hemoglobin: 10.7 g/dL — ABNORMAL LOW (ref 11.1–15.9)
Immature Grans (Abs): 0 10*3/uL (ref 0.0–0.1)
Immature Granulocytes: 0 %
Lymphocytes Absolute: 1.6 10*3/uL (ref 0.7–3.1)
Lymphs: 23 %
MCH: 29.7 pg (ref 26.6–33.0)
MCHC: 33 g/dL (ref 31.5–35.7)
MCV: 90 fL (ref 79–97)
Monocytes Absolute: 0.7 10*3/uL (ref 0.1–0.9)
Monocytes: 11 %
Neutrophils Absolute: 4.6 10*3/uL (ref 1.4–7.0)
Neutrophils: 66 %
Platelets: 387 10*3/uL (ref 150–450)
RBC: 3.6 x10E6/uL — ABNORMAL LOW (ref 3.77–5.28)
RDW: 13.6 % (ref 11.7–15.4)
WBC: 7 10*3/uL (ref 3.4–10.8)

## 2022-08-20 LAB — BASIC METABOLIC PANEL
BUN/Creatinine Ratio: 17 (ref 12–28)
BUN: 17 mg/dL (ref 8–27)
CO2: 26 mmol/L (ref 20–29)
Calcium: 9.7 mg/dL (ref 8.7–10.3)
Chloride: 95 mmol/L — ABNORMAL LOW (ref 96–106)
Creatinine, Ser: 1.03 mg/dL — ABNORMAL HIGH (ref 0.57–1.00)
Glucose: 86 mg/dL (ref 70–99)
Potassium: 5.5 mmol/L — ABNORMAL HIGH (ref 3.5–5.2)
Sodium: 133 mmol/L — ABNORMAL LOW (ref 134–144)
eGFR: 54 mL/min/{1.73_m2} — ABNORMAL LOW (ref >59)

## 2022-08-20 LAB — MAGNESIUM: Magnesium: 2 mg/dL (ref 1.6–2.3)

## 2022-08-20 MED ORDER — FUROSEMIDE 20 MG PO TABS: 20.0000 mg | ORAL_TABLET | Freq: Every day | ORAL | 1 refills | Status: DC

## 2022-08-20 NOTE — Telephone Encounter (Signed)
Julie Bergeron, MD    Yep absolutely. She can go back to the daily dosing!  Called the pt back and she is aware that per Dr. Johney Frame, we will increase her back to daily dosing of lasix 20 mg po daily.  Confirmed the pharmacy of choice with the pt.   Pt verbalized understanding and agrees with this plan.

## 2022-08-20 NOTE — Telephone Encounter (Signed)
-----   Message from Freada Bergeron, MD sent at 08/20/2022  9:27 AM EST ----- Yep absolutely. She can go back to the daily dosing! ----- Message ----- From: Nuala Alpha, LPN Sent: 23/05/5319   9:22 AM EST To: Nuala Alpha, LPN; Freada Bergeron, MD  Review and advise  ----- Message ----- From: Freada Bergeron, MD Sent: 08/20/2022   8:59 AM EST To: Nuala Alpha, LPN  Her labs show that her magnesium is excellent. Her Cr and Na are stable. K is elevated. Is she taking supplements or eating a lot of potassium rich foods? If so, we need to back down on these.  She is mildly anemic which she can follow-up with her PCP.  How is she doing on the reduced dose of lasix?

## 2022-09-02 ENCOUNTER — Encounter: Payer: Self-pay | Admitting: *Deleted

## 2022-09-22 ENCOUNTER — Telehealth: Payer: Self-pay | Admitting: Internal Medicine

## 2022-09-22 DIAGNOSIS — J4550 Severe persistent asthma, uncomplicated: Secondary | ICD-10-CM

## 2022-09-22 MED ORDER — FASENRA PEN 30 MG/ML ~~LOC~~ SOAJ: 30.0000 mg | SUBCUTANEOUS | 2 refills | Status: DC

## 2022-09-22 NOTE — Telephone Encounter (Addendum)
Per office protocol, we do not send year's worth of specialty medications to ensure pt follow-up.  She is due for f/u with Dr. Shearon Stalls in April 2024.   Refill has been sent today to [facility] for 6 months.  Knox Saliva, PharmD, MPH, BCPS, CPP Clinical Pharmacist (Rheumatology and Pulmonology)

## 2022-09-29 ENCOUNTER — Telehealth: Payer: Self-pay | Admitting: Internal Medicine

## 2022-10-18 NOTE — Progress Notes (Signed)
Office Visit Note  Patient: Julie Lutz             Date of Birth: 1941-03-26           MRN: [id number]             PCP: Tawnya Crook, MD Referring: Tawnya Crook, MD Visit Date: 10/29/2022 Occupation: @GUAROCC$ @  Subjective:  Treatment of osteoporosis  History of Present Illness: Julie Lutz is a 82 y.o. female seen in consultation per request of her PCP.  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  She relocated to [facility] in July 2023.  She is trying to establish care here.  She was diagnosed with osteoporosis several years ago.  She recalls getting her first Reclast infusion in 2016 and the second Reclast infusion in 2017.  She was on a drug holiday after that.  She also had lumbar spine surgery in between.  In December 2021 she had the third Reclast infusion and January 2023 fourth Reclast infusion.  Patient does not recall being on oral bisphosphonates or any other treatments.  She does not recall any history of fracture.  She states she had menopause at age 39.  She does not recall having steroid injections or prednisone.  She did not take any treatment for hypothyroidism or any seizure medications.  There takes Pepcid for reflux symptoms.  She takes calcium and vitamin D.  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  She is also developed about 4 inches of height loss over the years.  She continues to have some lower back pain despite having lumbar spine fusion and laminectomy in [place].  She is aware of the osteoarthritis in her bilateral hands.  She had bilateral total knee replacement for osteoarthritis in the past.  She recalls that her mother was bent over but she does not know if she had osteoporosis.  There is no history of fractures in her mother.    Activities of Daily Living:  Patient reports morning stiffness for 10 minutes.   Patient Denies nocturnal pain.  Difficulty dressing/grooming:  Denies Difficulty climbing stairs: Denies Difficulty getting out of chair: Denies Difficulty using hands for taps, buttons, cutlery, and/or writing: Denies  Review of Systems  Constitutional:  Positive for fatigue.  HENT:  Positive for mouth dryness. Negative for mouth sores.   Eyes:  Positive for dryness.  Respiratory: Negative.  Negative for shortness of breath.   Cardiovascular: Negative.  Negative for chest pain and palpitations.  Gastrointestinal: Negative.  Negative for blood in stool, constipation and diarrhea.  Endocrine: Negative.  Negative for increased urination.  Genitourinary: Negative.  Negative for involuntary urination.  Musculoskeletal:  Positive for joint pain, gait problem, joint pain, joint swelling and morning stiffness. Negative for myalgias, muscle weakness, muscle tenderness and myalgias.  Skin: Negative.  Negative for color change, rash, hair loss and sensitivity to sunlight.  Allergic/Immunologic: Negative.  Negative for susceptible to infections.  Neurological:  Negative for dizziness and headaches.  Hematological: Negative.  Negative for swollen glands.  Psychiatric/Behavioral:  Positive for sleep disturbance. Negative for depressed mood. The patient is not nervous/anxious.     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   OAB (overactive bladder) 05/25/2022   Primary insomnia 05/25/2022   Spinal stenosis of lumbar region with neurogenic claudication 05/25/2022   Sepsis ([place]) 05/03/2018    Past Medical History:  Diagnosis Date   Allergy    Arthritis    Asthma    esosinophillic   Cancer ([place])    vocal cord   Cataract     CHF (congestive heart failure) (HCC)    GERD (gastroesophageal reflux disease)    Hyperlipidemia    Hypertension    Hyponatremia    Osteoporosis    Spinal stenosis     Family History  Problem Relation Age of Onset   Diabetes Sister    Cancer Sister 80       ovarian   Anemia Sister    Allergies Daughter    Asthma Neg Hx    Past Surgical History:  Procedure Laterality Date   FEMUR FRACTURE SURGERY Right 08/2016   HERNIA REPAIR Right    inguinal   JOINT REPLACEMENT  2017   bilateral knee   LAMINECTOMY  11/2020   L3-5   LARYNGOSCOPY  11/2021   SPINAL FUSION  03/2021   Social History   Social History Narrative   2 grands   Retired Lexicographer History  Administered Date(s) Administered   [employer] Vaccination 10/17/2019, 11/14/2019     Objective: Vital Signs: BP 115/62 (BP Location: Right Arm, Patient Position: Sitting, Cuff Size: Normal)   Pulse 70   Resp 14   Ht 4' 9"$  (1.448 m)   Wt 139 lb 9.6 oz (63.3 kg)   BMI 30.21 kg/m    Physical Exam Vitals and nursing note reviewed.  Constitutional:      Appearance: She is well-developed.  HENT:     Head: Normocephalic and atraumatic.  Eyes:     Conjunctiva/sclera: Conjunctivae normal.  Cardiovascular:     Rate and Rhythm: Normal rate and regular rhythm.     Heart sounds: Normal heart sounds.  Pulmonary:     Effort: Pulmonary effort is normal.     Breath sounds: Normal breath sounds.  Abdominal:     General: Bowel sounds are normal.     Palpations: Abdomen is soft.  Musculoskeletal:     Cervical back: Normal range of motion.  Lymphadenopathy:     Cervical: No cervical adenopathy.  Skin:    General: Skin is warm and dry.     Capillary Refill: Capillary refill takes less than 2 seconds.  Neurological:     Mental Status: She is alert and oriented to person, place, and time.  Psychiatric:        Behavior: Behavior normal.      Musculoskeletal Exam: Cervical spine was in limited range  of motion.  She had severe thoracic kyphosis.  She had surgical scar on lumbar spine due to previous surgeries.  She had limited range of motion of her thoracic and lumbar spine.  There was no point tenderness.  Shoulder joints and elbow joints in good range of motion.  She had bilateral CMC PIP and DIP thickening consistent with osteoarthritis.  No synovitis was noted.  Subluxation of bilateral CMC and first PIP joints was noted.  Hip joints were in good range of motion without discomfort.  Bilateral knee joints were replaced and were in good range of motion without warmth swelling or effusion.  There was no tenderness over ankles.  Bilateral hammertoes were noted.  CDAI Exam: CDAI Score: -- Patient Global: --; Provider Global: -- Swollen: --; Tender: -- Joint Exam 10/29/2022   No joint exam has been documented for this visit   There is currently no information documented on the homunculus. Go to the Rheumatology activity and complete the homunculus joint exam.  Investigation: No additional findings.  Imaging: No results found.  Recent Labs: Lab Results  Component Value Date   WBC 6.4 10/26/2022   HGB 11.0 (L) 10/26/2022   PLT 308.0 10/26/2022   NA 135 10/26/2022   K 4.9 10/26/2022   CL 101 10/26/2022   CO2 21 10/26/2022   GLUCOSE 90 10/26/2022   BUN 15 10/26/2022   CREATININE 1.00 10/26/2022   BILITOT 0.4 10/26/2022   ALKPHOS 78 10/26/2022   AST 14 10/26/2022   ALT 11 10/26/2022   PROT 7.0 10/26/2022   ALBUMIN 4.3 10/26/2022   CALCIUM 8.7 10/26/2022   GFRAA >60 05/06/2018   September 5/23 TSH normal, vitamin D56.68, magnesium 1.4  Speciality Comments: No specialty comments available.  Procedures:  No procedures performed Allergies: Patient has no known allergies.   Assessment / Plan:     Visit Diagnoses: Age-related osteoporosis without current pathological fracture - DEXA 06/08/22: Right 1/3 distal radius BMD 0.536 with T-score -2.6. Vitamin D was 56.62 on 05/25/22  -patient gives history of osteoporosis diagnosed about 16 years ago.  She recalls having first Reclast infusion in 2016 and second 1 in 2017.  She was on a drug holiday for a while and also had lumbar spine surgery.  Her next Reclast infusion was in December 2021 followed by another 1 in January 2023.  She had recent bone density which was reviewed with the patient.  Her T-score is -2.6.  Comparison films are not available on the same machine .  She has significant thoracic kyphosis.  She had recent CBC and CMP which were within normal limits except for mild anemia.  Her TSH was normal.  Vitamin D was in desirable range.  She takes vitamin D supplement.  Magnesium was low.  She is on magnesium supplement.  We will check SPEP and PTH today.  Once the lab results are available we can schedule her for IV Reclast.  Patient voiced understanding.  Plan: Parathyroid hormone, intact (no Ca), Serum protein electrophoresis with reflex.  Detailed counsel regarding osteoporosis was provided.  A handout on osteoporosis was given.  A handout on reflux was also provided.  Side effects of Reclast were also discussed.  Height loss-she gives history of height loss of at least 4 inches.  She relates it to thoracic kyphosis and also lumbar spine surgery.  I will obtain x-rays of the thoracic and lumbar spine to look for vertebral fractures.  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  Primary osteoarthritis of both hands-she had bilateral PIP and DIP thickening with subluxation of bilateral CMC and first PIP joints.  Joint protection muscle strengthening was discussed.  A handout on hand exercises was given.  Status post total knee replacement, bilateral-she had bilateral knee joint replacement several years ago while she was living in [place].  She had good range of motion of her bilateral knee joints without any discomfort.  Spinal stenosis of lumbar region with neurogenic claudication -lumbar  laminectomy March 2022, May 2022 and number fusion July 2022 in [place] several years ago.  She states she is in chronic discomfort.  She denies any radiculopathy.  At this point she has not established  with a neurosurgeon.  Primary hypertension-her blood pressure was 115/62 today.  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  She also uses albuterol inhaler as needed.  Gastroesophageal reflux disease without esophagitis-she is on Prevacid.  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  OAB (overactive bladder)  Primary insomnia-she takes Xanax.  History of sepsis - x2 due to UTI in 2021 and 2022.  Former smoker-she quit smoking in 1998.  Orders: Orders Placed This Encounter  Procedures   Parathyroid hormone, intact (no Ca)   Serum protein electrophoresis with reflex   No orders of the defined types were placed in this encounter.  .  Follow-Up Instructions: Return for Osteoporosis, Osteoarthritis.   Bo Merino, MD  Note - This record has been created using Editor, commissioning.  Chart creation errors have been sought, but may not always  have been located. Such creation errors do not reflect on  the standard of medical care.

## 2022-10-26 ENCOUNTER — Ambulatory Visit: Payer: Medicare Other | Admitting: Family Medicine

## 2022-10-26 ENCOUNTER — Encounter: Payer: Self-pay | Admitting: Family Medicine

## 2022-10-26 VITALS — BP 100/60 | HR 62 | Temp 98.3°F | Ht 59.0 in | Wt 138.2 lb

## 2022-10-26 DIAGNOSIS — D508 Other iron deficiency anemias: Secondary | ICD-10-CM

## 2022-10-26 DIAGNOSIS — K219 Gastro-esophageal reflux disease without esophagitis: Secondary | ICD-10-CM

## 2022-10-26 DIAGNOSIS — D649 Anemia, unspecified: Secondary | ICD-10-CM | POA: Insufficient documentation

## 2022-10-26 DIAGNOSIS — Z79899 Other long term (current) drug therapy: Secondary | ICD-10-CM | POA: Diagnosis not present

## 2022-10-26 LAB — CBC WITH DIFFERENTIAL/PLATELET
Basophils Absolute: 0 10*3/uL (ref 0.0–0.1)
Basophils Relative: 0.2 % (ref 0.0–3.0)
Eosinophils Absolute: 0 10*3/uL (ref 0.0–0.7)
Eosinophils Relative: 0 % (ref 0.0–5.0)
HCT: 32.6 % — ABNORMAL LOW (ref 36.0–46.0)
Hemoglobin: 11 g/dL — ABNORMAL LOW (ref 12.0–15.0)
Lymphocytes Relative: 18.9 % (ref 12.0–46.0)
Lymphs Abs: 1.2 10*3/uL (ref 0.7–4.0)
MCHC: 33.7 g/dL (ref 30.0–36.0)
MCV: 92 fl (ref 78.0–100.0)
Monocytes Absolute: 0.7 10*3/uL (ref 0.1–1.0)
Monocytes Relative: 10.5 % (ref 3.0–12.0)
Neutro Abs: 4.5 10*3/uL (ref 1.4–7.7)
Neutrophils Relative %: 70.4 % (ref 43.0–77.0)
Platelets: 308 10*3/uL (ref 150.0–400.0)
RBC: 3.55 Mil/uL — ABNORMAL LOW (ref 3.87–5.11)
RDW: 14 % (ref 11.5–15.5)
WBC: 6.4 10*3/uL (ref 4.0–10.5)

## 2022-10-26 LAB — VITAMIN B12: Vitamin B-12: 707 pg/mL (ref 211–911)

## 2022-10-26 LAB — COMPREHENSIVE METABOLIC PANEL
ALT: 11 U/L (ref 0–35)
AST: 14 U/L (ref 0–37)
Albumin: 4.3 g/dL (ref 3.5–5.2)
Alkaline Phosphatase: 78 U/L (ref 39–117)
BUN: 15 mg/dL (ref 6–23)
CO2: 21 mEq/L (ref 19–32)
Calcium: 8.7 mg/dL (ref 8.4–10.5)
Chloride: 101 mEq/L (ref 96–112)
Creatinine, Ser: 1 mg/dL (ref 0.40–1.20)
GFR: 51.89 mL/min — ABNORMAL LOW (ref >60.00)
Glucose, Bld: 90 mg/dL (ref 70–99)
Potassium: 4.9 mEq/L (ref 3.5–5.1)
Sodium: 135 mEq/L (ref 135–145)
Total Bilirubin: 0.4 mg/dL (ref 0.2–1.2)
Total Protein: 7 g/dL (ref 6.0–8.3)

## 2022-10-26 LAB — IBC + FERRITIN
Ferritin: 93.6 ng/mL (ref 10.0–291.0)
Iron: 57 ug/dL (ref 42–145)
Saturation Ratios: 17 % — ABNORMAL LOW (ref 20.0–50.0)
TIBC: 334.6 ug/dL (ref 250.0–450.0)
Transferrin: 239 mg/dL (ref 212.0–360.0)

## 2022-10-26 LAB — MAGNESIUM: Magnesium: 1.8 mg/dL (ref 1.5–2.5)

## 2022-10-26 MED ORDER — PREVACID 30 MG PO CPDR: 30.0000 mg | DELAYED_RELEASE_CAPSULE | Freq: Every day | ORAL | 1 refills | Status: DC

## 2022-10-26 NOTE — Progress Notes (Signed)
Subjective:     Patient ID: Julie Lutz, female    DOB: Jun 11, 1941, 82 y.o.   MRN: [id number]  Chief Complaint  Patient presents with   Gastroesophageal Reflux    Having a lot of acid reflux   Blood work    Requesting blood work to be done Not fasting     HPI--here w/daughter GERD-a lot of reflux.  Saw ENT.  Past few wks, hoarse, clearing throat, tickle cough.  More heartburn.  Pepcid once/day.  Prevacid in past.   Not sick.  No dysphagia. No dark stools.   Past 1 wk, stools narrower.  Mg 4/d-concerned about all her labs-wants done.  Health Maintenance Due  Topic Date Due   Medicare Annual Wellness (AWV)  Never done   DTaP/Tdap/Td (1 - Tdap) Never done    Past Medical History:  Diagnosis Date   Allergy    Arthritis    Asthma    esosinophillic   Cancer ([place])    vocal cord   Cataract    CHF (congestive heart failure) (HCC)    GERD (gastroesophageal reflux disease)    Hyperlipidemia    Hypertension    Hyponatremia    Osteoporosis    Spinal stenosis     Past Surgical History:  Procedure Laterality Date   FEMUR FRACTURE SURGERY Right 08/2016   HERNIA REPAIR Right    inguinal   JOINT REPLACEMENT  2017   bilateral knee   LAMINECTOMY  11/2020   L3-5   LARYNGOSCOPY  11/2021   SPINAL FUSION  03/2021    Outpatient Medications Prior to Visit  Medication Sig Dispense Refill   albuterol (PROVENTIL HFA) 108 (90 Base) MCG/ACT inhaler Inhale 1-2 puffs into the lungs every 6 (six) hours as needed for wheezing or shortness of breath.     albuterol (PROVENTIL) (2.5 MG/3ML) 0.083% nebulizer solution Take 3 mLs (2.5 mg total) by nebulization every 6 (six) hours as needed for wheezing or shortness of breath. 75 mL 12   ALPRAZolam (XANAX) 0.5 MG tablet Take 1 tablet (0.5 mg total) by mouth at bedtime as needed for anxiety. 90 tablet 1   amLODipine (NORVASC) 2.5 MG tablet      atorvastatin (LIPITOR) 10 MG tablet Take by mouth.     Benralizumab (FASENRA PEN) 30 MG/ML SOAJ  Inject 1 mL (30 mg total) into the skin every 8 (eight) weeks. 1 mL 2   carvedilol (COREG) 25 MG tablet carvedilol 25 mg tablet  ONE BY MOUTH TWICE DAILY     famotidine (PEPCID) 40 MG tablet Take 1 tablet (40 mg total) by mouth daily. 90 tablet 3   fluticasone-salmeterol (ADVAIR) 250-50 MCG/ACT AEPB Inhale 1 puff into the lungs in the morning and at bedtime. 180 each 3   furosemide (LASIX) 20 MG tablet Take 1 tablet (20 mg total) by mouth daily. 90 tablet 1   MAGNESIUM CITRATE PO Take 1 tablet by mouth daily.     mirabegron ER (MYRBETRIQ) 50 MG TB24 tablet Take 1 tablet (50 mg total) by mouth daily. 90 tablet 3   mirtazapine (REMERON) 7.5 MG tablet Take 7.5 mg by mouth at bedtime.     Tiotropium Bromide Monohydrate (SPIRIVA RESPIMAT) 1.25 MCG/ACT AERS one time daily. .     Vitamin D, Ergocalciferol, 50 MCG (2000 UT) CAPS Take by mouth.     ondansetron (ZOFRAN) 4 MG tablet Take by mouth.     No facility-administered medications prior to visit.    No Known Allergies ROS  neg/noncontributory except as noted HPI/below      Objective:     BP 100/60   Pulse 62   Temp 98.3 F (36.8 C) (Temporal)   Ht '4\' 11"'$  (1.499 m)   Wt 138 lb 4 oz (62.7 kg)   SpO2 95%   BMI 27.92 kg/m  Wt Readings from Last 3 Encounters:  10/26/22 138 lb 4 oz (62.7 kg)  07/29/22 134 lb (60.8 kg)  07/15/22 130 lb 9.6 oz (59.2 kg)    Physical Exam   Gen: WDWN NAD HEENT: NCAT, conjunctiva not injected, sclera nonicteric NECK:  supple, no thyromegaly, scarred/firm neck Op dry.  hoarse CARDIAC: RRR, S1S2+, no murmur. LUNGS: CTAB. No wheezes ABDOMEN:  BS+, soft, NTND, No HSM, no masses EXT:  no edema MSK: cane.  NEURO: A&O x3.  CN II-XII intact.  PSYCH: normal mood. Good eye contact     Assessment & Plan:   Problem List Items Addressed This Visit       Digestive   Gastroesophageal reflux disease without esophagitis - Primary   Relevant Medications   PREVACID 30 MG capsule     Other   High risk  medication use   Relevant Orders   Comprehensive metabolic panel   Magnesium   Absolute anemia   Relevant Orders   IBC + Ferritin   Vitamin B12   CBC with Differential/Platelet   GERD-not controlled on pepcid '40mg'$  daily.  Restart prevacid '30mg'$  daily-pt requesting branded-worked better.  Take prevacid in am and pepcid in pm until improves, then stop the pepcid Anemia-chronic.  Check cbc,iron,B12 High risk meds-check cmp,mg.   Keep f/u in March  Meds ordered this encounter  Medications   PREVACID 30 MG capsule    Sig: Take 1 capsule (30 mg total) by mouth daily at 12 noon.    Dispense:  90 capsule    Refill:  1    Wellington Hampshire, MD

## 2022-10-26 NOTE — Patient Instructions (Signed)
Pepcid twice daily till get prevacid  Do prevacid in am and pepcid in pm until settled down  Then can stop the pepcid

## 2022-10-26 NOTE — Progress Notes (Signed)
Labs are stable.

## 2022-10-27 ENCOUNTER — Telehealth: Payer: Self-pay | Admitting: Family Medicine

## 2022-10-27 NOTE — Telephone Encounter (Signed)
Patient requests to be called because Patient states she forgot to ask a question about her Labs.

## 2022-10-28 NOTE — Telephone Encounter (Signed)
Returned call to patient. Patient wanted to know her hemoglobin results.

## 2022-10-29 ENCOUNTER — Ambulatory Visit: Payer: Medicare Other | Attending: Rheumatology | Admitting: Rheumatology

## 2022-10-29 ENCOUNTER — Encounter: Payer: Self-pay | Admitting: Rheumatology

## 2022-10-29 VITALS — BP 115/62 | HR 70 | Resp 14 | Ht <= 58 in | Wt 139.6 lb

## 2022-10-29 DIAGNOSIS — M48062 Spinal stenosis, lumbar region with neurogenic claudication: Secondary | ICD-10-CM | POA: Diagnosis present

## 2022-10-29 DIAGNOSIS — Z8619 Personal history of other infectious and parasitic diseases: Secondary | ICD-10-CM | POA: Diagnosis present

## 2022-10-29 DIAGNOSIS — R29890 Loss of height: Secondary | ICD-10-CM | POA: Diagnosis present

## 2022-10-29 DIAGNOSIS — F5101 Primary insomnia: Secondary | ICD-10-CM

## 2022-10-29 DIAGNOSIS — M19042 Primary osteoarthritis, left hand: Secondary | ICD-10-CM

## 2022-10-29 DIAGNOSIS — K219 Gastro-esophageal reflux disease without esophagitis: Secondary | ICD-10-CM | POA: Diagnosis present

## 2022-10-29 DIAGNOSIS — M40294 Other kyphosis, thoracic region: Secondary | ICD-10-CM | POA: Diagnosis not present

## 2022-10-29 DIAGNOSIS — J8283 Eosinophilic asthma: Secondary | ICD-10-CM | POA: Diagnosis present

## 2022-10-29 DIAGNOSIS — C320 Malignant neoplasm of glottis: Secondary | ICD-10-CM

## 2022-10-29 DIAGNOSIS — M19041 Primary osteoarthritis, right hand: Secondary | ICD-10-CM | POA: Insufficient documentation

## 2022-10-29 DIAGNOSIS — N3281 Overactive bladder: Secondary | ICD-10-CM

## 2022-10-29 DIAGNOSIS — Z87891 Personal history of nicotine dependence: Secondary | ICD-10-CM | POA: Diagnosis present

## 2022-10-29 DIAGNOSIS — M810 Age-related osteoporosis without current pathological fracture: Secondary | ICD-10-CM | POA: Diagnosis not present

## 2022-10-29 DIAGNOSIS — Z96653 Presence of artificial knee joint, bilateral: Secondary | ICD-10-CM

## 2022-10-29 DIAGNOSIS — I10 Essential (primary) hypertension: Secondary | ICD-10-CM | POA: Diagnosis present

## 2022-10-29 NOTE — Patient Instructions (Addendum)
Zoledronic Acid Injection (Bone Disorders) What is this medication? ZOLEDRONIC ACID (ZOE le dron ik AS id) prevents and treats osteoporosis. It may also be used to treat Paget's disease of the bone. It works by making your bones stronger and less likely to break (fracture). It belongs to a group of medications called bisphosphonates. This medicine may be used for other purposes; ask your health care provider or pharmacist if you have questions. COMMON BRAND NAME(S): Reclast What should I tell my care team before I take this medication? They need to know if you have any of these conditions: Bleeding disorder Cancer Dental disease Kidney disease Low levels of calcium in the blood Low red blood cell counts Lung or breathing disease, such as asthma Receiving steroids, such as dexamethasone or prednisone An unusual or allergic reaction to zoledronic acid, other medications, foods, dyes, or preservatives Pregnant or trying to get pregnant Breast-feeding How should I use this medication? This medication is injected into a vein. It is given by your care team in a hospital or clinic setting. A special MedGuide will be given to you before each treatment. Be sure to read this information carefully each time. Talk to your care team about the use of this medication in children. Special care may be needed. Overdosage: If you think you have taken too much of this medicine contact a poison control center or emergency room at once. NOTE: This medicine is only for you. Do not share this medicine with others. What if I miss a dose? Keep appointments for follow-up doses. It is important not to miss your dose. Call your care team if you are unable to keep an appointment. What may interact with this medication? Certain antibiotics given by injection Medications for pain and inflammation, such as ibuprofen, naproxen, NSAIDs Some diuretics, such as bumetanide, furosemide Teriparatide This list may not  describe all possible interactions. Give your health care provider a list of all the medicines, herbs, non-prescription drugs, or dietary supplements you use. Also tell them if you smoke, drink alcohol, or use illegal drugs. Some items may interact with your medicine. What should I watch for while using this medication? Visit your care team for regular checks on your progress. It may be some time before you see the benefit from this medication. Some people who take this medication have severe bone, joint, or muscle pain. This medication may also increase your risk for jaw problems or a broken thigh bone. Tell your care team right away if you have severe pain in your jaw, bones, joints, or muscles. Tell your care team if you have any pain that does not go away or that gets worse. You should make sure you get enough calcium and vitamin D while you are taking this medication. Discuss the foods you eat and the vitamins you take with your care team. You may need bloodwork while taking this medication. Tell your dentist and dental surgeon that you are taking this medication. You should not have major dental surgery while on this medication. See your dentist to have a dental exam and fix any dental problems before starting this medication. Take good care of your teeth while on this medication. Make sure you see your dentist for regular follow-up appointments. What side effects may I notice from receiving this medication? Side effects that you should report to your care team as soon as possible: Allergic reactions--skin rash, itching, hives, swelling of the face, lips, tongue, or throat Kidney injury--decrease in the amount of urine,   swelling of the ankles, hands, or feet Low calcium level--muscle pain or cramps, confusion, tingling, or numbness in the hands or feet Osteonecrosis of the jaw--pain, swelling, or redness in the mouth, numbness of the jaw, poor healing after dental work, unusual discharge from the  mouth, visible bones in the mouth Severe bone, joint, or muscle pain Side effects that usually do not require medical attention (report to your care team if they continue or are bothersome): Diarrhea Dizziness Headache Nausea Stomach pain Vomiting This list may not describe all possible side effects. Call your doctor for medical advice about side effects. You may report side effects to FDA at 1-800-FDA-1088. Where should I keep my medication? This medication is given in a hospital or clinic. It will not be stored at home. NOTE: This sheet is a summary. It may not cover all possible information. If you have questions about this medicine, talk to your doctor, pharmacist, or health care provider.  2023 Elsevier/Gold Standard (2021-10-23 00:00:00) Osteoporosis  Osteoporosis happens when the bones become thin and less dense than normal. Osteoporosis makes bones more brittle and fragile and more likely to break (fracture). Over time, osteoporosis can cause your bones to become so weak that they fracture after a minor fall. Bones in the hip, wrist, and spine are most likely to fracture due to osteoporosis. What are the causes? The exact cause of this condition is not known. What increases the risk? You are more likely to develop this condition if you: Have family members with this condition. Have poor nutrition. Use the following: Steroid medicines, such as prednisone. Anti-seizure medicines. Nicotine or tobacco, such as cigarettes, e-cigarettes, and chewing tobacco. Are female. Are age 39 or older. Are not physically active (are sedentary). Are of European or Asian descent. Have a small body frame. What are the signs or symptoms? A fracture might be the first sign of osteoporosis, especially if the fracture results from a fall or injury that usually would not cause a bone to break. Other signs and symptoms include: Pain in the neck or low back. Stooped posture. Loss of height. How is  this diagnosed? This condition may be diagnosed based on: Your medical history. A physical exam. A bone mineral density test, also called a DXA or DEXA test (dual-energy X-ray absorptiometry test). This test uses X-rays to measure the amount of minerals in your bones. How is this treated? This condition may be treated by: Making lifestyle changes, such as: Including foods with more calcium and vitamin D in your diet. Doing weight-bearing and muscle-strengthening exercises. Stopping tobacco use. Limiting alcohol intake. Taking medicine to slow the process of bone loss or to increase bone density. Taking daily supplements of calcium and vitamin D. Taking hormone replacement medicines, such as estrogen for women and testosterone for men. Monitoring your levels of calcium and vitamin D. The goal of treatment is to strengthen your bones and lower your risk for a fracture. Follow these instructions at home: Eating and drinking Include calcium and vitamin D in your diet. Calcium is important for bone health, and vitamin D helps your body absorb calcium. Good sources of calcium and vitamin D include: Certain fatty fish, such as salmon and tuna. Products that have calcium and vitamin D added to them (are fortified), such as fortified cereals. Egg yolks. Cheese. Liver.  Activity Do exercises as told by your health care provider. Ask your health care provider what exercises and activities are safe for you. You should do: Exercises that make you  work Museum/gallery conservator (weight-bearing exercises), such as tai chi, yoga, or walking. Exercises to strengthen muscles, such as lifting weights. Lifestyle Do not drink alcohol if: Your health care provider tells you not to drink. You are pregnant, may be pregnant, or are planning to become pregnant. If you drink alcohol: Limit how much you use to: 0-1 drink a day for women. 0-2 drinks a day for men. Know how much alcohol is in your drink. In the [place]  one drink equals one 12 oz bottle of beer (355 mL), one 5 oz glass of wine (148 mL), or one 1 oz glass of hard liquor (44 mL). Do not use any products that contain nicotine or tobacco, such as cigarettes, e-cigarettes, and chewing tobacco. If you need help quitting, ask your health care provider. Preventing falls Use devices to help you move around (mobility aids) as needed, such as canes, walkers, scooters, or crutches. Keep rooms well-lit and clutter-free. Remove tripping hazards from walkways, including cords and throw rugs. Install grab bars in bathrooms and safety rails on stairs. Use rubber mats in the bathroom and other areas that are often wet or slippery. Wear closed-toe shoes that fit well and support your feet. Wear shoes that have rubber soles or low heels. Review your medicines with your health care provider. Some medicines can cause dizziness or changes in blood pressure, which can increase your risk of falling. General instructions Take over-the-counter and prescription medicines only as told by your health care provider. Keep all follow-up visits. This is important. Contact a health care provider if: You have never been screened for osteoporosis and you are: A woman who is age 66 or older. A man who is age 19 or older. Get help right away if: You fall or injure yourself. Summary Osteoporosis is thinning and loss of density in your bones. This makes bones more brittle and fragile and more likely to break (fracture),even with minor falls. The goal of treatment is to strengthen your bones and lower your risk for a fracture. Include calcium and vitamin D in your diet. Calcium is important for bone health, and vitamin D helps your body absorb calcium. Talk with your health care provider about screening for osteoporosis if you are a woman who is age 77 or older, or a man who is age 8 or older. This information is not intended to replace advice given to you by your health care  provider. Make sure you discuss any questions you have with your health care provider. Document Revised: 02/21/2020 Document Reviewed: 02/21/2020 Elsevier Patient Education  [place]. Hand Exercises Hand exercises can be helpful for almost anyone. These exercises can strengthen the hands, improve flexibility and movement, and increase blood flow to the hands. These results can make work and daily tasks easier. Hand exercises can be especially helpful for people who have joint pain from arthritis or have nerve damage from overuse (carpal tunnel syndrome). These exercises can also help people who have injured a hand. Exercises Most of these hand exercises are gentle stretching and motion exercises. It is usually safe to do them often throughout the day. Warming up your hands before exercise may help to reduce stiffness. You can do this with gentle massage or by placing your hands in warm water for 10-15 minutes. It is normal to feel some stretching, pulling, tightness, or mild discomfort as you begin new exercises. This will gradually improve. Stop an exercise right away if you feel sudden, severe pain or your pain  gets worse. Ask your health care provider which exercises are best for you. Knuckle bend or "claw" fist  Stand or sit with your arm, hand, and all five fingers pointed straight up. Make sure to keep your wrist straight during the exercise. Gently bend your fingers down toward your palm until the tips of your fingers are touching the top of your palm. Keep your big knuckle straight and just bend the small knuckles in your fingers. Hold this position for __________ seconds. Straighten (extend) your fingers back to the starting position. Repeat this exercise 5-10 times with each hand. Full finger fist  Stand or sit with your arm, hand, and all five fingers pointed straight up. Make sure to keep your wrist straight during the exercise. Gently bend your fingers into your palm until  the tips of your fingers are touching the middle of your palm. Hold this position for __________ seconds. Extend your fingers back to the starting position, stretching every joint fully. Repeat this exercise 5-10 times with each hand. Straight fist Stand or sit with your arm, hand, and all five fingers pointed straight up. Make sure to keep your wrist straight during the exercise. Gently bend your fingers at the big knuckle, where your fingers meet your hand, and the middle knuckle. Keep the knuckle at the tips of your fingers straight and try to touch the bottom of your palm. Hold this position for __________ seconds. Extend your fingers back to the starting position, stretching every joint fully. Repeat this exercise 5-10 times with each hand. Tabletop  Stand or sit with your arm, hand, and all five fingers pointed straight up. Make sure to keep your wrist straight during the exercise. Gently bend your fingers at the big knuckle, where your fingers meet your hand, as far down as you can while keeping the small knuckles in your fingers straight. Think of forming a tabletop with your fingers. Hold this position for __________ seconds. Extend your fingers back to the starting position, stretching every joint fully. Repeat this exercise 5-10 times with each hand. Finger spread  Place your hand flat on a table with your palm facing down. Make sure your wrist stays straight as you do this exercise. Spread your fingers and thumb apart from each other as far as you can until you feel a gentle stretch. Hold this position for __________ seconds. Bring your fingers and thumb tight together again. Hold this position for __________ seconds. Repeat this exercise 5-10 times with each hand. Making circles  Stand or sit with your arm, hand, and all five fingers pointed straight up. Make sure to keep your wrist straight during the exercise. Make a circle by touching the tip of your thumb to the tip of your  index finger. Hold for __________ seconds. Then open your hand wide. Repeat this motion with your thumb and each finger on your hand. Repeat this exercise 5-10 times with each hand. Thumb motion  Sit with your forearm resting on a table and your wrist straight. Your thumb should be facing up toward the ceiling. Keep your fingers relaxed as you move your thumb. Lift your thumb up as high as you can toward the ceiling. Hold for __________ seconds. Bend your thumb across your palm as far as you can, reaching the tip of your thumb for the small finger (pinkie) side of your palm. Hold for __________ seconds. Repeat this exercise 5-10 times with each hand. Grip strengthening  Hold a stress ball or other soft ball  in the middle of your hand. Slowly increase the pressure, squeezing the ball as much as you can without causing pain. Think of bringing the tips of your fingers into the middle of your palm. All of your finger joints should bend when doing this exercise. Hold your squeeze for __________ seconds, then relax. Repeat this exercise 5-10 times with each hand. Contact a health care provider if: Your hand pain or discomfort gets much worse when you do an exercise. Your hand pain or discomfort does not improve within 2 hours after you exercise. If you have any of these problems, stop doing these exercises right away. Do not do them again unless your health care provider says that you can. Get help right away if: You develop sudden, severe hand pain or swelling. If this happens, stop doing these exercises right away. Do not do them again unless your health care provider says that you can. This information is not intended to replace advice given to you by your health care provider. Make sure you discuss any questions you have with your health care provider. Document Revised: 12/25/2020 Document Reviewed: 12/25/2020 Elsevier Patient Education  [place].

## 2022-11-02 LAB — PROTEIN ELECTROPHORESIS, SERUM, WITH REFLEX
Albumin ELP: 4.1 g/dL (ref 3.8–4.8)
Alpha 1: 0.4 g/dL — ABNORMAL HIGH (ref 0.2–0.3)
Alpha 2: 0.8 g/dL (ref 0.5–0.9)
Beta 2: 0.4 g/dL (ref 0.2–0.5)
Beta Globulin: 0.4 g/dL (ref 0.4–0.6)
Gamma Globulin: 0.9 g/dL (ref 0.8–1.7)
Total Protein: 7 g/dL (ref 6.1–8.1)

## 2022-11-02 LAB — PARATHYROID HORMONE, INTACT (NO CA): PTH: 52 pg/mL (ref 16–77)

## 2022-11-02 NOTE — Progress Notes (Signed)
SPEP and PTH are normal.  Patient can be scheduled for Reclast infusions when she is ready.

## 2022-11-03 ENCOUNTER — Telehealth: Payer: Self-pay | Admitting: Pharmacy Technician

## 2022-11-03 ENCOUNTER — Telehealth: Payer: Self-pay | Admitting: Pharmacist

## 2022-11-03 DIAGNOSIS — M810 Age-related osteoporosis without current pathological fracture: Secondary | ICD-10-CM

## 2022-11-03 NOTE — Telephone Encounter (Addendum)
Referral placed to [facility] for Reclast  Dose: 49m IV every 12 months  Premedications: acetaminophen 6574mPO and diphenhydramine 25 mg PO  DeKnox SalivaPharmD, MPH, BCPS, CPP Clinical Pharmacist (Rheumatology and Pulmonology)  ----- Message from ShShona NeedlesRT sent at 11/02/2022  2:01 PM EST ----- Pharmacy - Patient is ready to start Reclast. Please contact patient. Thank you.  I called patient, SPEP and PTH are normal.  Patient can be scheduled for Reclast infusions when she is ready.

## 2022-11-03 NOTE — Progress Notes (Signed)
Will start Reclast BIV  Knox Saliva, PharmD, MPH, BCPS, CPP Clinical Pharmacist (Rheumatology and Pulmonology)

## 2022-11-03 NOTE — Telephone Encounter (Addendum)
Auth Submission: NO AUTH NEEDED Payer: MEDICARE A/B - emblem Medication & CPT/J Code(s) submitted: Reclast (Zolendronic acid) [id number] Route of submission (phone, fax, portal):  Phone # Fax # Auth type: Buy/Bill Units/visits requested: 1 Reference number: [id number] Id Emblem: [id number] Approval from: 11/03/22 to 11/04/23

## 2022-11-11 ENCOUNTER — Ambulatory Visit (HOSPITAL_COMMUNITY)
Admission: RE | Admit: 2022-11-11 | Discharge: 2022-11-11 | Disposition: A | Discharge status: Home / Self Care | Payer: Medicare Other | Source: Ambulatory Visit | Attending: Rheumatology | Admitting: Rheumatology

## 2022-11-11 DIAGNOSIS — M40294 Other kyphosis, thoracic region: Secondary | ICD-10-CM

## 2022-11-11 DIAGNOSIS — R29890 Loss of height: Secondary | ICD-10-CM | POA: Diagnosis present

## 2022-11-11 DIAGNOSIS — M48062 Spinal stenosis, lumbar region with neurogenic claudication: Secondary | ICD-10-CM

## 2022-11-11 DIAGNOSIS — M810 Age-related osteoporosis without current pathological fracture: Secondary | ICD-10-CM | POA: Diagnosis not present

## 2022-11-11 LAB — HM MAMMOGRAPHY

## 2022-11-11 NOTE — Telephone Encounter (Signed)
Reclast scheduled for 11/16/2022. Will f/u to ensure completed  Knox Saliva, PharmD, MPH, BCPS, CPP Clinical Pharmacist (Rheumatology and Pulmonology)

## 2022-11-15 NOTE — Progress Notes (Signed)
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 L1 compression fracture. Possible aortic aneurysm. Patient will need abdominal US to evaluate. Please forward results to her PCP and have patient contact her PCP's office regarding abdominal US. We will discuss xray results at the fu visit at length. I left a message on patient's answering machine to call back.

## 2022-11-16 ENCOUNTER — Ambulatory Visit (INDEPENDENT_AMBULATORY_CARE_PROVIDER_SITE_OTHER): Payer: Medicare Other

## 2022-11-16 ENCOUNTER — Telehealth: Payer: Self-pay | Admitting: Family Medicine

## 2022-11-16 VITALS — BP 113/47 | HR 62 | Temp 97.4°F | Resp 18 | Ht <= 58 in | Wt 140.0 lb

## 2022-11-16 DIAGNOSIS — M810 Age-related osteoporosis without current pathological fracture: Secondary | ICD-10-CM | POA: Diagnosis not present

## 2022-11-16 MED ORDER — ACETAMINOPHEN 325 MG PO TABS: 650.0000 mg | ORAL_TABLET | Freq: Once | ORAL | Status: DC

## 2022-11-16 MED ORDER — DIPHENHYDRAMINE HCL 25 MG PO CAPS: 25.0000 mg | ORAL_CAPSULE | Freq: Once | ORAL | Status: DC

## 2022-11-16 MED ORDER — SODIUM CHLORIDE 0.9 % IV SOLN: INTRAVENOUS | Status: DC

## 2022-11-16 MED ORDER — ZOLEDRONIC ACID 5 MG/100ML IV SOLN
5.0000 mg | Freq: Once | INTRAVENOUS | Status: AC
  Administered 2022-11-16: 5 mg via INTRAVENOUS
  Filled 2022-11-16: qty 100

## 2022-11-16 NOTE — Telephone Encounter (Signed)
Patient states: - Dr. Estanislado Pandy, rheumatology, informed her that she would share recent DG Lumbar/Thoracic spine results from 11/11/22 with PCP office - Rheumatologist recommends an abdominal sonogram   Patient wanted to make sure that message was received by pcp. Have you all gotten the results?

## 2022-11-16 NOTE — Progress Notes (Signed)
I called patient and discussed x-ray results with her.  I also discussed the possibility of abdominal aortic aneurysm.  Patient stated that Dr. Kathrin Ruddy office is aware of it and will schedule an ultrasound.

## 2022-11-16 NOTE — Patient Instructions (Signed)

## 2022-11-16 NOTE — Telephone Encounter (Signed)
Please see message below

## 2022-11-16 NOTE — Progress Notes (Signed)
Diagnosis: Osteoporosis  Provider:  Marshell Garfinkel MD  Procedure: Infusion  IV Type: Peripheral, IV Location: R Antecubital  Reclast (Zolendronic Acid), Dose: 5 mg  Infusion Start Time: 1013  Infusion Stop Time: 1046  Post Infusion IV Care: Patient declined observation and Peripheral IV Discontinued  Discharge: Condition: Good, Destination: Home . AVS Declined  Performed by:  Cleophus Molt, RN

## 2022-11-17 ENCOUNTER — Other Ambulatory Visit: Payer: Self-pay | Admitting: *Deleted

## 2022-11-17 DIAGNOSIS — R9389 Abnormal findings on diagnostic imaging of other specified body structures: Secondary | ICD-10-CM

## 2022-11-17 NOTE — Telephone Encounter (Signed)
Orders placed. Patient notified and verbalized understanding.

## 2022-11-18 ENCOUNTER — Telehealth: Payer: Self-pay | Admitting: Cardiology

## 2022-11-18 NOTE — Telephone Encounter (Signed)
Called the pt back.  She states [facility] keeps filling her old lasix 20 mg every other day dosing and her lasix 20 mg po daily script.  Pt states the every other day was discontinued along time ago and they keep filling it.   Informed the pt that she is correct and she is taking lasix 20 mg po daily which [facility] has been filling.    Informed the pt that I will call [facility] and have them remove her old lasix EOD script and advised them that she is no longer on that and to ONLY fill the lasix 20 mg po daily script.   Pt verbalized understanding and agrees with this plan.  She was gracious for all the assistance provided.    Called [facility] and spoke with the Pharmacist there about this.  He removed old script of EOD dosing and they will only keep filling the daily dosing of lasix 20 mg.

## 2022-11-18 NOTE — Telephone Encounter (Signed)
  Pt c/o medication issue:  1. Name of Medication: furosemide (LASIX) 20 MG tablet   2. How are you currently taking this medication (dosage and times per day)?   Take 1 tablet (20 mg total) by mouth daily.    3. Are you having a reaction (difficulty breathing--STAT)? No   4. What is your medication issue? Pt said, she has question about this medication and requesting a call back from the nurse

## 2022-11-18 NOTE — Progress Notes (Unsigned)
Office Visit Note  Patient: Julie Lutz             Date of Birth: 10/30/1940           MRN: [id number]             PCP: Tawnya Crook, MD Referring: Tawnya Crook, MD Visit Date: 12/02/2022 Occupation: '@GUAROCC'$ @  Subjective:  Medication monitoring   History of Present Illness: [name] is a 82 y.o. female with history of osteoporosis and osteoarthritis.  She had a Reclast infusion on 11/16/2022.  She tolerated the infusion without any side effects.  She continues to take vitamin D 2000 units daily.  She is not taking a calcium supplement.  She denies any recent falls.  She continues to use a cane to assist with ambulation.  She has difficulty standing or walking for long distances due to the discomfort in her lower back.  She states both knee replacements are doing well.  She experiences intermittent pain and stiffness in both hands but denies any joint swelling.   Activities of Daily Living:  Patient reports morning stiffness for 30 minutes.   Patient Denies nocturnal pain.  Difficulty dressing/grooming: Denies Difficulty climbing stairs: Denies Difficulty getting out of chair: Denies Difficulty using hands for taps, buttons, cutlery, and/or writing: Denies  Review of Systems  Constitutional:  Positive for fatigue.  HENT:  Negative for mouth sores and mouth dryness.   Eyes:  Positive for dryness.  Respiratory:  Positive for shortness of breath.   Cardiovascular:  Negative for chest pain and palpitations.  Gastrointestinal:  Negative for blood in stool, constipation and diarrhea.  Endocrine: Negative for increased urination.  Genitourinary:  Negative for involuntary urination.  Musculoskeletal:  Positive for joint pain, gait problem, joint pain, myalgias, morning stiffness and myalgias. Negative for joint swelling, muscle weakness and muscle tenderness.  Skin:  Negative for color change, rash, hair loss and sensitivity to sunlight.  Allergic/Immunologic: Negative for  susceptible to infections.  Neurological:  Negative for dizziness and headaches.  Hematological:  Negative for swollen glands.  Psychiatric/Behavioral:  Negative for depressed mood and sleep disturbance. The patient is not nervous/anxious.     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   OAB (overactive bladder) 05/25/2022   Primary insomnia 05/25/2022   Spinal stenosis of lumbar region with neurogenic claudication 05/25/2022   Sepsis ([place]) 05/03/2018    Past Medical History:  Diagnosis Date   Allergy    Arthritis    Asthma    esosinophillic   Cancer ([place])    vocal cord   Cataract    CHF (congestive heart failure) (HCC)    GERD (gastroesophageal reflux disease)    Hyperlipidemia    Hypertension    Hyponatremia    Osteoporosis    Spinal stenosis     Family History  Problem Relation Age of Onset   Diabetes Sister    Cancer Sister 34       ovarian   Anemia Sister    Allergies Daughter    Asthma Neg Hx    Past Surgical History:  Procedure Laterality Date  FEMUR FRACTURE SURGERY Right 08/2016   HERNIA REPAIR Right    inguinal   JOINT REPLACEMENT  2017   bilateral knee   LAMINECTOMY  11/2020   L3-5   LARYNGOSCOPY  11/2021   SPINAL FUSION  03/2021   Social History   Social History Narrative   2 grands   Retired Lexicographer History  Administered Date(s) Administered   [employer] Vaccination 10/17/2019, 11/14/2019     Objective: Vital Signs: BP 125/72 (BP Location: Left Arm, Patient Position:  Sitting, Cuff Size: Normal)   Pulse 76   Resp 18   Ht '4\' 9"'$  (1.448 m)   Wt 139 lb (63 kg)   BMI 30.08 kg/m    Physical Exam Vitals and nursing note reviewed.  Constitutional:      Appearance: She is well-developed.  HENT:     Head: Normocephalic and atraumatic.  Eyes:     Conjunctiva/sclera: Conjunctivae normal.  Cardiovascular:     Rate and Rhythm: Normal rate and regular rhythm.     Heart sounds: Normal heart sounds.  Pulmonary:     Effort: Pulmonary effort is normal.     Breath sounds: Normal breath sounds.  Abdominal:     General: Bowel sounds are normal.     Palpations: Abdomen is soft.  Musculoskeletal:     Cervical back: Normal range of motion.  Skin:    General: Skin is warm and dry.     Capillary Refill: Capillary refill takes less than 2 seconds.  Neurological:     Mental Status: She is alert and oriented to person, place, and time.  Psychiatric:        Behavior: Behavior normal.      Musculoskeletal Exam: C-spine has limited range of motion with lateral rotation.  Thoracic kyphosis noted.  No midline spinal tenderness.  Discomfort with lumbar range of motion.  Shoulder joints and elbow joints have good range of motion.  CMC joint thickening noted bilaterally.  PIP and DIP thickening consistent with osteoarthritis of both hands.  Both knee replacements have good range of motion with no warmth or effusion.  Ankle joints have good range of motion with no tenderness or joint swelling.  CDAI Exam: CDAI Score: -- Patient Global: --; Provider Global: -- Swollen: --; Tender: -- Joint Exam 12/02/2022   No joint exam has been documented for this visit   There is currently no information documented on the homunculus. Go to the Rheumatology activity and complete the homunculus joint exam.  Investigation: No additional findings.  Imaging: VAS [place] AAA DUPLEX  Result Date: 11/25/2022 ABDOMINAL AORTA STUDY Patient Name:  Julie Lutz  Date of Exam:   11/25/2022 Medical  Rec #: [id number]      Accession #:    [id number] Date of Birth: [birth year]       Patient Gender: F Patient Age:   58 years Exam Location:  [facility] Procedure:      VAS [place] AAA DUPLEX Referring Phys: ANN KULIK --------------------------------------------------------------------------------  Indications: possible aneurysm seen on lumbar x-ray 11/14/2022  Comparison Study: No prior study Performing Technologist: Ralene Cork RVT  Examination Guidelines: A complete evaluation includes B-mode imaging, spectral Doppler, color Doppler, and power Doppler as needed of all accessible portions of each vessel. Bilateral testing is considered an integral part of a complete examination. Limited examinations for reoccurring indications may be performed as noted.  Abdominal Aorta Findings: +-------------+-------+----------+----------+--------+--------+--------+ Location     AP (cm)Trans (cm)PSV (cm/s)WaveformThrombusComments +-------------+-------+----------+----------+--------+--------+--------+ Proximal  1.85   1.79      77                                 +-------------+-------+----------+----------+--------+--------+--------+ Mid          2.07   2.03      65                                 +-------------+-------+----------+----------+--------+--------+--------+ Distal       1.50   1.44      97                                 +-------------+-------+----------+----------+--------+--------+--------+ RT CIA Prox  1.1    1.1       86                                 +-------------+-------+----------+----------+--------+--------+--------+ RT CIA Distal                 100                                +-------------+-------+----------+----------+--------+--------+--------+ RT EIA Mid                    103                                +-------------+-------+----------+----------+--------+--------+--------+ LT CIA Prox  1.0    1.1       90                                  +-------------+-------+----------+----------+--------+--------+--------+ LT CIA Distal                 90                                 +-------------+-------+----------+----------+--------+--------+--------+ LT EIA Mid                    102                                +-------------+-------+----------+----------+--------+--------+--------+  Summary: Abdominal Aorta: No evidence of an abdominal aortic aneurysm was visualized.  *See table(s) above for measurements and observations.  Electronically signed by Deitra Mayo MD on 11/25/2022 at 10:04:50 AM.    Final    DG Lumbar Spine 2-3 Views  Result Date: 11/14/2022 CLINICAL DATA:  Osteopenia, height loss EXAM: LUMBAR SPINE - 2-3 VIEW COMPARISON:  None Available. FINDINGS: Dextroconvex lumbar curvature. Prior L2-L4 posterior fusion. Intact hardware without evidence of loosening. Slight exaggeration of the lumbar lordosis. There is severe degenerative disc disease at T12-L1 and L1-L2. There is an L1 compression deformity with up to 15% height loss, age indeterminate. There is posterior wedging of L5 which is likely physiologic. There is moderate-severe degenerative disc disease at L4-L5 and L5-S1. There is moderate-severe facet arthropathy at L4-L5 and L5-S1. Aortic atherosclerosis  with max AP diameter of the aorta measuring up to 3.5 cm on the lateral view. Partially visualized right hip fixation hardware. IMPRESSION: Age-indeterminate compression deformity of L1 with up to 15% height loss. Prior L2-L4 posterior fusion. Intact hardware without evidence of loosening. Severe degenerative disc disease at T12-L1 and L1-L2. Moderate-severe degenerative disc disease and facet arthropathy at L4-L5 and L5-S1. Possible abdominal aortic aneurysm measuring up to 3.5 cm. Recommend abdominal aortic ultrasound. Electronically Signed   By: Maurine Simmering M.D.   On: 11/14/2022 10:33   DG Thoracic Spine 2 View  Result Date:  11/14/2022 CLINICAL DATA:  Osteopenia, height loss EXAM: THORACIC SPINE 2 VIEWS COMPARISON:  None Available. FINDINGS: Exaggerated thoracic kyphosis. Slight levoconvex curvature. Osteopenia. Prior lumbar fusion is partially visualized. There are severe degenerative changes above the lumbar fusion at T12-L1 and L1-L2. Mild to moderate degenerative changes throughout the thoracic spine above T12. There is no evidence of thoracic spine fracture. Aortic atherosclerosis. IMPRESSION: Exaggerated thoracic kyphosis with slight levoconvex curvature. Severe degenerative disc disease at T12-L1 and L1-L2. Mild-to-moderate multilevel degenerative disc disease in the thoracic spine above T12. Osteopenia.  No evidence of thoracic spine fracture. Electronically Signed   By: Maurine Simmering M.D.   On: 11/14/2022 10:28    Recent Labs: Lab Results  Component Value Date   WBC 6.4 10/26/2022   HGB 11.0 (L) 10/26/2022   PLT 308.0 10/26/2022   NA 135 10/26/2022   K 4.9 10/26/2022   CL 101 10/26/2022   CO2 21 10/26/2022   GLUCOSE 90 10/26/2022   BUN 15 10/26/2022   CREATININE 1.00 10/26/2022   BILITOT 0.4 10/26/2022   ALKPHOS 78 10/26/2022   AST 14 10/26/2022   ALT 11 10/26/2022   PROT 7.0 10/29/2022   ALBUMIN 4.3 10/26/2022   CALCIUM 8.7 10/26/2022   GFRAA >60 05/06/2018    Speciality Comments: Reclast infusion 2016, 2017, 2021, January 2023  Procedures:  No procedures performed Allergies: Patient has no known allergies.   Assessment / Plan:     Visit Diagnoses: Age-related osteoporosis without current pathological fracture -  DEXA 06/08/22: Right 1/3 distal radius BMD 0.536 with T-score -2.6. Vitamin D was 56.62 on 05/25/22 -patient gives history of osteoporosis diagnosed about 16 years ago.  She recalls having first Reclast infusion in 2016 and second one in 2017.  She was on a drug holiday for a while and also had lumbar spine surgery.  Previous therapy: Reclast December 2021 and in January 2023.  She has  significant thoracic kyphosis.  X-rays of the lumbar spine on 11/11/2022:Age-indeterminate compression deformity of L1 with up to 15% height loss.  Possible abdominal aortic aneurysm noted-patient underwent an updated ultrasound which did not reveal an aneurysm per patient. Reviewed lab work from 10/29/2022.  Restarted on Reclast on 11/16/2022.  She tolerated Reclast without any side effects.  She continues to take vitamin D 2000 units daily.  Discussed the importance of dietary calcium intake since she does not want to take a calcium supplement. She plans to continue to use a cane to assist with ambulation.  She has not had any recent falls. Due to update DEXA in September 2025.  Height loss - History of height loss of at least 4 inches..  X-rays of the lumbar spine on 11/11/2022: Age-indeterminate compression deformity of L1 with up to 15% height loss.  Other kyphosis of thoracic region: Thoracic kyphosis noted.  No midline spinal tenderness on examination today.  Primary osteoarthritis of both hands: She has  PIP and DIP thickening consistent with osteoarthritis of both hands.  CMC joint prominence and thickening bilaterally.  No synovitis noted.  Status post total knee replacement, bilateral: Doing well.  Good range of motion with no warmth or effusion.  Spinal stenosis of lumbar region with neurogenic claudication - -lumbar laminectomy March 2022, May 2022 and number fusion July 2022 in [place] several years ago..  X-rays of the lumbar spine on 11/11/2022:Age-indeterminate compression deformity of L1 with up to 15% height loss.  Other medical conditions are listed as follows:  Primary hypertension: Blood pressure was 125/72 today in the office.  Eosinophilic asthma  Gastroesophageal reflux disease without esophagitis  Vocal cord cancer (HCC)  OAB (overactive bladder)  Primary insomnia  History of sepsis  Former smoker  Orders: No orders of the defined types were placed in this  encounter.  No orders of the defined types were placed in this encounter.    Follow-Up Instructions: Return in about 6 months (around 06/04/2023) for Osteoporosis.   Ofilia Neas, PA-C  Note - This record has been created using Dragon software.  Chart creation errors have been sought, but may not always  have been located. Such creation errors do not reflect on  the standard of medical care.

## 2022-11-23 ENCOUNTER — Ambulatory Visit (INDEPENDENT_AMBULATORY_CARE_PROVIDER_SITE_OTHER): Payer: Medicare Other | Admitting: Family Medicine

## 2022-11-23 ENCOUNTER — Encounter: Payer: Self-pay | Admitting: Family Medicine

## 2022-11-23 ENCOUNTER — Encounter: Payer: Self-pay | Admitting: Rheumatology

## 2022-11-23 VITALS — BP 110/66 | HR 66 | Temp 98.4°F | Ht 59.0 in | Wt 140.1 lb

## 2022-11-23 DIAGNOSIS — M810 Age-related osteoporosis without current pathological fracture: Secondary | ICD-10-CM

## 2022-11-23 DIAGNOSIS — Z79899 Other long term (current) drug therapy: Secondary | ICD-10-CM

## 2022-11-23 DIAGNOSIS — E7800 Pure hypercholesterolemia, unspecified: Secondary | ICD-10-CM | POA: Diagnosis not present

## 2022-11-23 DIAGNOSIS — I10 Essential (primary) hypertension: Secondary | ICD-10-CM

## 2022-11-23 DIAGNOSIS — J8283 Eosinophilic asthma: Secondary | ICD-10-CM

## 2022-11-23 DIAGNOSIS — K219 Gastro-esophageal reflux disease without esophagitis: Secondary | ICD-10-CM

## 2022-11-23 MED ORDER — AZELASTINE HCL 0.1 % NA SOLN: 2.0000 | Freq: Two times a day (BID) | NASAL | 12 refills | Status: DC

## 2022-11-23 NOTE — Patient Instructions (Signed)
It was very nice to see you today!  [facility]     PLEASE NOTE:  If you had any lab tests please let us know if you have not heard back within a few days. You may see your results on MyChart before we have a chance to review them but we will give you a call once they are reviewed by [place]. If we ordered any referrals today, please let us know if you have not heard from their office within the next week.   Please try these tips to maintain a healthy lifestyle:  Eat most of your calories during the day when you are active. Eliminate processed foods including packaged sweets (pies, cakes, cookies), reduce intake of potatoes, white bread, white pasta, and white rice. Look for whole grain options, oat flour or almond flour.  Each meal should contain half fruits/vegetables, one quarter protein, and one quarter carbs (no bigger than a computer mouse).  Cut down on sweet beverages. This includes juice, soda, and sweet tea. Also watch fruit intake, though this is a healthier sweet option, it still contains natural sugar! Limit to 3 servings daily.  Drink at least 1 glass of water with each meal and aim for at least 8 glasses per day  Exercise at least 150 minutes every week.

## 2022-11-23 NOTE — Progress Notes (Signed)
Subjective:     Patient ID: Julie Lutz, female    DOB: [birth year], 82 y.o.   MRN: [id number]  Chief Complaint  Patient presents with   Follow-up    6 month follow-up for HTN     HPI-here w/daughter  HTN-Pt is on amlodipine 2.5, coreg 25, lasix '20mg'$ .  Bp's running 120/60.  No ha/dizziness/cp/palp/edema/cough/sob  HLD-on atorvastatin '10mg'$ -no complaints  Eosinophilic Asthma-  No inc sob.  No fever.  Cough increased past few days and throat congestion.  Can't find azelastin otc.  DDD,DJD- spine-seeing rheum.  Got her reclast.   Does have compression fx L1- Has AAA for long time-seen on Lumbar films-u/s ordered.    Health Maintenance Due  Topic Date Due   Medicare Annual Wellness (AWV)  Never done   DTaP/Tdap/Td (1 - Tdap) Never done    Past Medical History:  Diagnosis Date   Allergy    Arthritis    Asthma    esosinophillic   Cancer ([place])    vocal cord   Cataract    CHF (congestive heart failure) (HCC)    GERD (gastroesophageal reflux disease)    Hyperlipidemia    Hypertension    Hyponatremia    Osteoporosis    Spinal stenosis     Past Surgical History:  Procedure Laterality Date   FEMUR FRACTURE SURGERY Right 08/2016   HERNIA REPAIR Right    inguinal   JOINT REPLACEMENT  2017   bilateral knee   LAMINECTOMY  11/2020   L3-5   LARYNGOSCOPY  11/2021   SPINAL FUSION  03/2021    Outpatient Medications Prior to Visit  Medication Sig Dispense Refill   albuterol (PROVENTIL HFA) 108 (90 Base) MCG/ACT inhaler Inhale 1-2 puffs into the lungs every 6 (six) hours as needed for wheezing or shortness of breath.     albuterol (PROVENTIL) (2.5 MG/3ML) 0.083% nebulizer solution Take 3 mLs (2.5 mg total) by nebulization every 6 (six) hours as needed for wheezing or shortness of breath. 75 mL 12   ALPRAZolam (XANAX) 0.5 MG tablet Take 1 tablet (0.5 mg total) by mouth at bedtime as needed for anxiety. 90 tablet 1   amLODipine (NORVASC) 2.5 MG tablet      atorvastatin (LIPITOR)  10 MG tablet Take by mouth.     Benralizumab (FASENRA PEN) 30 MG/ML SOAJ Inject 1 mL (30 mg total) into the skin every 8 (eight) weeks. 1 mL 2   carvedilol (COREG) 25 MG tablet carvedilol 25 mg tablet  ONE BY MOUTH TWICE DAILY     fluticasone-salmeterol (ADVAIR) 250-50 MCG/ACT AEPB Inhale 1 puff into the lungs in the morning and at bedtime. 180 each 3   furosemide (LASIX) 20 MG tablet Take 1 tablet (20 mg total) by mouth daily. 90 tablet 1   MAGNESIUM CITRATE PO Take 1 tablet by mouth daily.     mirabegron ER (MYRBETRIQ) 50 MG TB24 tablet Take 1 tablet (50 mg total) by mouth daily. 90 tablet 3   mirtazapine (REMERON) 7.5 MG tablet Take 7.5 mg by mouth at bedtime.     PREVACID 30 MG capsule Take 1 capsule (30 mg total) by mouth daily at 12 noon. 90 capsule 1   Tiotropium Bromide Monohydrate (SPIRIVA RESPIMAT) 1.25 MCG/ACT AERS one time daily. .     Vitamin D, Ergocalciferol, 50 MCG (2000 UT) CAPS Take by mouth.     No facility-administered medications prior to visit.    No Known Allergies ROS neg/noncontributory except as noted HPI/below  Objective:     BP 110/66   Pulse 66   Temp 98.4 F (36.9 C) (Temporal)   Ht '4\' 11"'$  (1.499 m)   Wt 140 lb 2 oz (63.6 kg)   SpO2 97%   BMI 28.30 kg/m  Wt Readings from Last 3 Encounters:  11/23/22 140 lb 2 oz (63.6 kg)  11/16/22 140 lb (63.5 kg)  10/29/22 139 lb 9.6 oz (63.3 kg)    Physical Exam   Gen: WDWN NAD HEENT: NCAT, conjunctiva not injected, sclera nonicteric.  hoarse NECK:  supple, no thyromegaly, no nodes, no carotid bruits CARDIAC: RRR, S1S2+, no murmur. DP 2+B LUNGS: CTAB. No wheezes ABDOMEN:  BS+, soft, NTND, No HSM, no masses EXT:  no edema MSK: cane NEURO: A&O x3.  CN II-XII intact.  PSYCH: normal mood. Good eye contact  Reviewed labs     Assessment & Plan:   Problem List Items Addressed This Visit       Cardiovascular and Mediastinum   Primary hypertension - Primary     Respiratory   Eosinophilic  asthma     Digestive   Gastroesophageal reflux disease without esophagitis     Musculoskeletal and Integument   Age-related osteoporosis without current pathological fracture (Chronic)     Other   High risk medication use   Relevant Orders   Vitamin B12   Pure hypercholesterolemia   Relevant Orders   Lipid panel  1.  Hypertension-chronic.  Controlled.  Continue amlodipine 2.5 mg, carvedilol 25 mg, furosemide 20 mg 2.  Hyperlipidemia-chronic.  Controlled.  Continue Lipitor 10 mg daily.  Reviewed labs.  Check lipids 3.  GERD-chronic.  Fairly well-controlled on Prevacid and Pepcid.  Continue.  Monitor 4.  Eosinophilic asthma-chronic.  Mostly controlled.  However, allergies seem to be flaring.  Continue albuterol, Spiriva, Advair 250, Fasenra.  Will add azelastine 2 sprays each nostril twice daily as needed 5.  Osteoporosis-chronic.  Managed by rheumatology.  Just received infusion of Reclast. 6.  Degenerative disc/joint disease-back and other joints.  Chronic.  Fair control.  Tries to stay active. 7.  High risk medications-check B12 level  Follow-up 6 months  Meds ordered this encounter  Medications   azelastine (ASTELIN) 0.1 % nasal spray    Sig: Place 2 sprays into both nostrils 2 (two) times daily.    Dispense:  30 mL    Refill:  12    Wellington Hampshire, MD

## 2022-11-24 LAB — LIPID PANEL
Cholesterol: 160 mg/dL (ref 0–200)
HDL: 79.3 mg/dL (ref >39.00)
LDL Cholesterol: 68 mg/dL (ref 0–99)
NonHDL: 80.89
Total CHOL/HDL Ratio: 2
Triglycerides: 64 mg/dL (ref 0.0–149.0)
VLDL: 12.8 mg/dL (ref 0.0–40.0)

## 2022-11-24 LAB — VITAMIN B12: Vitamin B-12: 1040 pg/mL — ABNORMAL HIGH (ref 211–911)

## 2022-11-25 ENCOUNTER — Ambulatory Visit (HOSPITAL_COMMUNITY)
Admission: RE | Admit: 2022-11-25 | Discharge: 2022-11-25 | Disposition: A | Discharge status: Home / Self Care | Payer: Medicare Other | Source: Ambulatory Visit | Attending: Family Medicine | Admitting: Family Medicine

## 2022-11-25 DIAGNOSIS — R9389 Abnormal findings on diagnostic imaging of other specified body structures: Secondary | ICD-10-CM | POA: Insufficient documentation

## 2022-12-02 ENCOUNTER — Ambulatory Visit: Payer: Medicare Other | Attending: Rheumatology | Admitting: Physician Assistant

## 2022-12-02 ENCOUNTER — Encounter: Payer: Self-pay | Admitting: Physician Assistant

## 2022-12-02 VITALS — BP 125/72 | HR 76 | Resp 18 | Ht <= 58 in | Wt 139.0 lb

## 2022-12-02 DIAGNOSIS — M40294 Other kyphosis, thoracic region: Secondary | ICD-10-CM | POA: Diagnosis present

## 2022-12-02 DIAGNOSIS — M19042 Primary osteoarthritis, left hand: Secondary | ICD-10-CM

## 2022-12-02 DIAGNOSIS — M810 Age-related osteoporosis without current pathological fracture: Secondary | ICD-10-CM | POA: Diagnosis present

## 2022-12-02 DIAGNOSIS — M19041 Primary osteoarthritis, right hand: Secondary | ICD-10-CM

## 2022-12-02 DIAGNOSIS — R29890 Loss of height: Secondary | ICD-10-CM | POA: Diagnosis present

## 2022-12-02 DIAGNOSIS — N3281 Overactive bladder: Secondary | ICD-10-CM

## 2022-12-02 DIAGNOSIS — J8283 Eosinophilic asthma: Secondary | ICD-10-CM

## 2022-12-02 DIAGNOSIS — C320 Malignant neoplasm of glottis: Secondary | ICD-10-CM | POA: Diagnosis present

## 2022-12-02 DIAGNOSIS — F5101 Primary insomnia: Secondary | ICD-10-CM

## 2022-12-02 DIAGNOSIS — Z87891 Personal history of nicotine dependence: Secondary | ICD-10-CM

## 2022-12-02 DIAGNOSIS — K219 Gastro-esophageal reflux disease without esophagitis: Secondary | ICD-10-CM | POA: Diagnosis present

## 2022-12-02 DIAGNOSIS — I10 Essential (primary) hypertension: Secondary | ICD-10-CM | POA: Diagnosis present

## 2022-12-02 DIAGNOSIS — M48062 Spinal stenosis, lumbar region with neurogenic claudication: Secondary | ICD-10-CM | POA: Diagnosis present

## 2022-12-02 DIAGNOSIS — Z8619 Personal history of other infectious and parasitic diseases: Secondary | ICD-10-CM

## 2022-12-02 DIAGNOSIS — Z96653 Presence of artificial knee joint, bilateral: Secondary | ICD-10-CM

## 2022-12-15 ENCOUNTER — Ambulatory Visit: Payer: PRIVATE HEALTH INSURANCE | Admitting: Rheumatology

## 2023-01-05 ENCOUNTER — Ambulatory Visit: Payer: PRIVATE HEALTH INSURANCE | Admitting: Rheumatology

## 2023-01-05 NOTE — Telephone Encounter (Signed)
Seems like encounter was open in error so closing encounter.  

## 2023-01-27 ENCOUNTER — Telehealth: Payer: Self-pay | Admitting: Family Medicine

## 2023-01-27 NOTE — Telephone Encounter (Signed)
Contacted Orpah Kowalski to schedule their annual wellness visit. Appointment made for 02/03/2023.  Gabriel Cirri [facility] AWV TEAM Direct Dial [phone]

## 2023-02-01 ENCOUNTER — Ambulatory Visit: Payer: Medicare Other | Admitting: Internal Medicine

## 2023-02-01 ENCOUNTER — Other Ambulatory Visit: Payer: Self-pay | Admitting: Family Medicine

## 2023-02-01 ENCOUNTER — Telehealth: Payer: Self-pay | Admitting: Family Medicine

## 2023-02-01 MED ORDER — ALPRAZOLAM 0.5 MG PO TABS: 0.5000 mg | ORAL_TABLET | Freq: Every evening | ORAL | 1 refills | Status: DC | PRN

## 2023-02-01 NOTE — Progress Notes (Unsigned)
Cardiology Office Note:    Date:  02/03/2023   ID:  Julie Lutz, DOB 03-27-1941, MRN [id number]  PCP:  [name], MD   [facility] Providers Cardiologist:  None {  Referring MD: [name], MD    History of Present Illness:    Julie Lutz is a 82 y.o. female with a hx of HTN, HLD, asthma and vocal cord cancer who presents to clinic for follow-up.  Was initially seen in 07/2022 to establish care. Was doing well from a CV standpoint at that time.  Today, the patient overall feels well. No chest pain, orthopnea, PND or significant LE edema. Has been having seasonal allergies and worsening asthma with the weather change. Back limits her activity but she has no anginal symptoms with exertion.    Past Medical History:  Diagnosis Date   Allergy    Arthritis    Asthma    esosinophillic   Cancer (HCC)    vocal cord   Cataract    CHF (congestive heart failure) (HCC)    GERD (gastroesophageal reflux disease)    Hyperlipidemia    Hypertension    Hyponatremia    Osteoporosis    Spinal stenosis     Past Surgical History:  Procedure Laterality Date   FEMUR FRACTURE SURGERY Right 08/2016   HERNIA REPAIR Right    inguinal   JOINT REPLACEMENT  2017   bilateral knee   LAMINECTOMY  11/2020   L3-5   LARYNGOSCOPY  11/2021   SPINAL FUSION  03/2021    Current Medications: Current Meds  Medication Sig   albuterol (PROVENTIL HFA) 108 (90 Base) MCG/ACT inhaler Inhale 1-2 puffs into the lungs every 6 (six) hours as needed for wheezing or shortness of breath.   albuterol (PROVENTIL) (2.5 MG/3ML) 0.083% nebulizer solution Take 3 mLs (2.5 mg total) by nebulization every 6 (six) hours as needed for wheezing or shortness of breath.   ALPRAZolam (XANAX) 0.5 MG tablet Take 1 tablet (0.5 mg total) by mouth at bedtime as needed for anxiety.   azelastine (ASTELIN) 0.1 % nasal spray Place 2 sprays into both nostrils 2 (two) times daily.   Benralizumab (FASENRA PEN) 30  MG/ML SOAJ Inject 1 mL (30 mg total) into the skin every 8 (eight) weeks.   carvedilol (COREG) 25 MG tablet carvedilol 25 mg tablet  ONE BY MOUTH TWICE DAILY   Coenzyme Q10 (COQ-10 PO) Take by mouth.   Cyanocobalamin (VITAMIN B 12 PO) Take by mouth.   fluticasone-salmeterol (ADVAIR) 250-50 MCG/ACT AEPB Inhale 1 puff into the lungs in the morning and at bedtime.   furosemide (LASIX) 20 MG tablet Take 1 tablet (20 mg total) by mouth daily.   MAGNESIUM CITRATE PO Take 1 tablet by mouth daily.   mirabegron ER (MYRBETRIQ) 50 MG TB24 tablet Take 1 tablet (50 mg total) by mouth daily.   mirtazapine (REMERON) 7.5 MG tablet Take 7.5 mg by mouth at bedtime.   PREVACID 30 MG capsule Take 1 capsule (30 mg total) by mouth daily at 12 noon.   Tiotropium Bromide Monohydrate (SPIRIVA RESPIMAT) 1.25 MCG/ACT AERS one time daily. .   Vitamin D, Ergocalciferol, 50 MCG (2000 UT) CAPS Take by mouth.   [DISCONTINUED] amLODipine (NORVASC) 2.5 MG tablet    [DISCONTINUED] atorvastatin (LIPITOR) 10 MG tablet Take by mouth.     Allergies:   Patient has no known allergies.   Social History   Socioeconomic History   Marital status: Widowed    Spouse  name: Not on file   Number of children: 2   Years of education: Not on file   Highest education level: Not on file  Occupational History   Not on file  Tobacco Use   Smoking status: Former    Types: Cigarettes    Quit date: 66    Years since quitting: 34.3    Passive exposure: Never   Smokeless tobacco: Never  Vaping Use   Vaping Use: Never used  Substance and Sexual Activity   Alcohol use: Yes    Alcohol/week: 5.0 standard drinks of alcohol    Types: 5 Glasses of wine per week    Comment: occasional   Drug use: Never   Sexual activity: Not Currently  Other Topics Concern   Not on file  Social History Narrative   2 grands   Retired Charity fundraiser   Social Determinants of Corporate investment banker Strain: Low Risk  (02/03/2023)   Overall Financial Resource  Strain (CARDIA)    Difficulty of Paying Living Expenses: Not hard at all  Food Insecurity: No Food Insecurity (02/03/2023)   Hunger Vital Sign    Worried About Running Out of Food in the Last Year: Never true    Ran Out of Food in the Last Year: Never true  Transportation Needs: No Transportation Needs (02/03/2023)   PRAPARE - Administrator, Civil Service (Medical): No    Lack of Transportation (Non-Medical): No  Physical Activity: Inactive (02/03/2023)   Exercise Vital Sign    Days of Exercise per Week: 0 days    Minutes of Exercise per Session: 0 min  Stress: No Stress Concern Present (02/03/2023)   [employer] of Occupational Health - Occupational Stress Questionnaire    Feeling of Stress : Not at all  Social Connections: Socially Isolated (02/03/2023)   Social Connection and Isolation Panel [NHANES]    Frequency of Communication with Friends and Family: More than three times a week    Frequency of Social Gatherings with Friends and Family: Twice a week    Attends Religious Services: Never    Database administrator or Organizations: No    Attends Banker Meetings: Never    Marital Status: Widowed     Family History: The patient's family history includes Allergies in her daughter; Anemia in her sister; Cancer (age of onset: 18) in her sister; Diabetes in her sister. There is no history of Asthma.  ROS:   Review of Systems  Constitutional:  Negative for chills and fever.  HENT:  Negative for nosebleeds.   Eyes:  Negative for discharge.  Respiratory:  Negative for cough and sputum production.   Cardiovascular:  Negative for chest pain, palpitations, orthopnea, claudication, leg swelling and PND.  Gastrointestinal:  Negative for melena, nausea and vomiting.  Genitourinary:  Negative for frequency.  Musculoskeletal:  Negative for falls.  Neurological:  Negative for dizziness, tingling and loss of consciousness.  Psychiatric/Behavioral:  Negative for  depression.      EKGs/Labs/Other Studies Reviewed:    The following studies were reviewed today:  No prior cardiovascular studies available.  EKG:  No new tracing  Recent Labs: 05/25/2022: TSH 2.78 10/26/2022: ALT 11; BUN 15; Creatinine, Ser 1.00; Hemoglobin 11.0; Magnesium 1.8; Platelets 308.0; Potassium 4.9; Sodium 135   Recent Lipid Panel    Component Value Date/Time   CHOL 160 11/23/2022 1420   TRIG 64.0 11/23/2022 1420   HDL 79.30 11/23/2022 1420   CHOLHDL 2 11/23/2022 1420  VLDL 12.8 11/23/2022 1420   LDLCALC 68 11/23/2022 1420     Risk Assessment/Calculations:                Physical Exam:    VS:  BP 130/70   Ht 4\' 9"  (1.448 m)   Wt 142 lb 3.2 oz (64.5 kg)   SpO2 97%   BMI 30.77 kg/m     Wt Readings from Last 3 Encounters:  02/03/23 142 lb 3.2 oz (64.5 kg)  02/03/23 139 lb (63 kg)  12/02/22 139 lb (63 kg)     GEN: Well nourished, well developed in no acute distress HEENT: Normal NECK: No JVD; No carotid bruits CARDIAC: RRR, no murmurs, rubs, gallops RESPIRATORY:  Clear to auscultation without rales, wheezing or rhonchi  ABDOMEN: Soft, non-tender, non-distended MUSCULOSKELETAL:  No edema; No deformity  SKIN: Warm and dry NEUROLOGIC:  Alert and oriented x 3 PSYCHIATRIC:  Normal affect   ASSESSMENT:    1. Primary hypertension   2. Pure hypercholesterolemia   3. Leg edema   4. Anemia, unspecified type     PLAN:    In order of problems listed above:  #HTN: Well controlled and at goal <130/90s. -Continue amlodipine 2.5mg  daily -Continue coreg 25mg  BID  #LE Edema: -Continue lasix 20mg  daily  #HLD: -Continue lipitor 10mg  daily -Monitored by PCP; no known CAD  #Asthma: Follows with Pulm.  #Chronic Anemia: Stable with no symptoms of bleeding. -Trend CBC      Follow-up:    Medication Adjustments/Labs and Tests Ordered: Current medicines are reviewed at length with the patient today.  Concerns regarding medicines are outlined  above.   No orders of the defined types were placed in this encounter.  Meds ordered this encounter  Medications   DISCONTD: amLODipine (NORVASC) 2.5 MG tablet    Sig: Take 1 tablet (2.5 mg total) by mouth daily.    Dispense:  90 tablet    Refill:  3   DISCONTD: atorvastatin (LIPITOR) 10 MG tablet    Sig: Take 1 tablet (10 mg total) by mouth daily.    Dispense:  90 tablet    Refill:  3   atorvastatin (LIPITOR) 10 MG tablet    Sig: Take 1 tablet (10 mg total) by mouth daily.    Dispense:  90 tablet    Refill:  3   amLODipine (NORVASC) 2.5 MG tablet    Sig: Take 1 tablet (2.5 mg total) by mouth daily.    Dispense:  90 tablet    Refill:  3   Patient Instructions  Medication Instructions:   Your physician recommends that you continue on your current medications as directed. Please refer to the Current Medication list given to you today.  *If you need a refill on your cardiac medications before your next appointment, please call your pharmacy*   Follow-Up: At [facility], you and your health needs are our priority.  As part of our continuing mission to provide you with exceptional heart care, we have created designated Provider Care Teams.  These Care Teams include your primary Cardiologist (physician) and Advanced Practice Providers (APPs -  Physician Assistants and Nurse Practitioners) who all work together to provide you with the care you need, when you need it.  We recommend signing up for the patient portal called "MyChart".  Sign up information is provided on this After Visit Summary.  MyChart is used to connect with patients for Virtual Visits (Telemedicine).  Patients are able to view lab/test results,  encounter notes, upcoming appointments, etc.  Non-urgent messages can be sent to your provider as well.   To learn more about what you can do with MyChart, go to [email or web address].    Your next appointment:   1 year(s)  Provider:   Dr. Shari Prows        Signed, [name], MD  02/03/2023 1:49 PM    [facility]

## 2023-02-01 NOTE — Telephone Encounter (Signed)
Please see message below

## 2023-02-01 NOTE — Telephone Encounter (Signed)
Prescription Request  02/01/2023  LOV: 11/23/2022  What is the name of the medication or equipment?  ALPRAZolam (XANAX) 0.5 MG tablet   Have you contacted your pharmacy to request a refill? No   Which pharmacy would you like this sent to? [id number] [place], [place] - [address] [place] [address] Phone: [phone] Fax: [phone]   Patient notified that their request is being sent to the clinical staff for review and that they should receive a response within 2 business days.   Please advise at Mobile [phone] (mobile)

## 2023-02-03 ENCOUNTER — Encounter: Payer: Self-pay | Admitting: Cardiology

## 2023-02-03 ENCOUNTER — Ambulatory Visit: Payer: Medicare Other | Attending: Cardiology | Admitting: Cardiology

## 2023-02-03 ENCOUNTER — Ambulatory Visit (INDEPENDENT_AMBULATORY_CARE_PROVIDER_SITE_OTHER): Payer: Medicare Other

## 2023-02-03 ENCOUNTER — Encounter: Payer: Self-pay | Admitting: Family Medicine

## 2023-02-03 VITALS — Wt 139.0 lb

## 2023-02-03 VITALS — BP 130/70 | Ht <= 58 in | Wt 142.2 lb

## 2023-02-03 DIAGNOSIS — D649 Anemia, unspecified: Secondary | ICD-10-CM | POA: Insufficient documentation

## 2023-02-03 DIAGNOSIS — Z0000 Encounter for general adult medical examination without abnormal findings: Secondary | ICD-10-CM | POA: Diagnosis not present

## 2023-02-03 DIAGNOSIS — I10 Essential (primary) hypertension: Secondary | ICD-10-CM | POA: Diagnosis not present

## 2023-02-03 DIAGNOSIS — E7800 Pure hypercholesterolemia, unspecified: Secondary | ICD-10-CM | POA: Insufficient documentation

## 2023-02-03 DIAGNOSIS — R600 Localized edema: Secondary | ICD-10-CM | POA: Insufficient documentation

## 2023-02-03 MED ORDER — ATORVASTATIN CALCIUM 10 MG PO TABS: 10.0000 mg | ORAL_TABLET | Freq: Every day | ORAL | 3 refills | Status: DC

## 2023-02-03 MED ORDER — AMLODIPINE BESYLATE 2.5 MG PO TABS: 2.5000 mg | ORAL_TABLET | Freq: Every day | ORAL | 3 refills | Status: DC

## 2023-02-03 NOTE — Progress Notes (Signed)
I connected with  Julie Lutz on 02/03/23 by a audio enabled telemedicine application and verified that I am speaking with the correct person using two identifiers.  Patient Location: Home  Provider Location: Office/Clinic  I discussed the limitations of evaluation and management by telemedicine. The patient expressed understanding and agreed to proceed.   Subjective:   Julie Lutz is a 82 y.o. female who presents for an Initial Medicare Annual Wellness Visit.  Review of Systems     Cardiac Risk Factors include: advanced age (>59men, >63 women);obesity (BMI >30kg/m2);dyslipidemia;hypertension     Objective:    Today's Vitals   02/03/23 0830  Weight: 139 lb (63 kg)   Body mass index is 30.08 kg/m.     02/03/2023    8:37 AM 05/03/2018    9:44 PM 05/03/2018    1:06 PM  Advanced Directives  Does Patient Have a Medical Advance Directive? Yes Yes Yes  Type of Estate agent of [place];Living will Healthcare Power of [employer] of [place];Living will  Does patient want to make changes to medical advance directive?  No - Patient declined   Copy of Healthcare Power of Attorney in Chart? No - copy requested No - copy requested     Current Medications (verified) Outpatient Encounter Medications as of 02/03/2023  Medication Sig   albuterol (PROVENTIL HFA) 108 (90 Base) MCG/ACT inhaler Inhale 1-2 puffs into the lungs every 6 (six) hours as needed for wheezing or shortness of breath.   ALPRAZolam (XANAX) 0.5 MG tablet Take 1 tablet (0.5 mg total) by mouth at bedtime as needed for anxiety.   amLODipine (NORVASC) 2.5 MG tablet    atorvastatin (LIPITOR) 10 MG tablet Take by mouth.   azelastine (ASTELIN) 0.1 % nasal spray Place 2 sprays into both nostrils 2 (two) times daily.   Benralizumab (FASENRA PEN) 30 MG/ML SOAJ Inject 1 mL (30 mg total) into the skin every 8 (eight) weeks.   carvedilol (COREG) 25 MG tablet carvedilol 25 mg tablet  ONE BY MOUTH  TWICE DAILY   Coenzyme Q10 (COQ-10 PO) Take by mouth.   Cyanocobalamin (VITAMIN B 12 PO) Take by mouth.   fluticasone-salmeterol (ADVAIR) 250-50 MCG/ACT AEPB Inhale 1 puff into the lungs in the morning and at bedtime.   furosemide (LASIX) 20 MG tablet Take 1 tablet (20 mg total) by mouth daily.   MAGNESIUM CITRATE PO Take 1 tablet by mouth daily.   mirabegron ER (MYRBETRIQ) 50 MG TB24 tablet Take 1 tablet (50 mg total) by mouth daily.   mirtazapine (REMERON) 7.5 MG tablet Take 7.5 mg by mouth at bedtime.   PREVACID 30 MG capsule Take 1 capsule (30 mg total) by mouth daily at 12 noon.   Tiotropium Bromide Monohydrate (SPIRIVA RESPIMAT) 1.25 MCG/ACT AERS one time daily. .   Vitamin D, Ergocalciferol, 50 MCG (2000 UT) CAPS Take by mouth.   albuterol (PROVENTIL) (2.5 MG/3ML) 0.083% nebulizer solution Take 3 mLs (2.5 mg total) by nebulization every 6 (six) hours as needed for wheezing or shortness of breath. (Patient not taking: Reported on 02/03/2023)   No facility-administered encounter medications on file as of 02/03/2023.    Allergies (verified) Patient has no known allergies.   History: Past Medical History:  Diagnosis Date   Allergy    Arthritis    Asthma    esosinophillic   Cancer (HCC)    vocal cord   Cataract    CHF (congestive heart failure) (HCC)    GERD (gastroesophageal reflux disease)  Hyperlipidemia    Hypertension    Hyponatremia    Osteoporosis    Spinal stenosis    Past Surgical History:  Procedure Laterality Date   FEMUR FRACTURE SURGERY Right 08/2016   HERNIA REPAIR Right    inguinal   JOINT REPLACEMENT  2017   bilateral knee   LAMINECTOMY  11/2020   L3-5   LARYNGOSCOPY  11/2021   SPINAL FUSION  03/2021   Family History  Problem Relation Age of Onset   Diabetes Sister    Cancer Sister 30       ovarian   Anemia Sister    Allergies Daughter    Asthma Neg Hx    Social History   Socioeconomic History   Marital status: Widowed    Spouse name:  Not on file   Number of children: 2   Years of education: Not on file   Highest education level: Not on file  Occupational History   Not on file  Tobacco Use   Smoking status: Former    Types: Cigarettes    Quit date: 1990    Years since quitting: 34.3    Passive exposure: Never   Smokeless tobacco: Never  Vaping Use   Vaping Use: Never used  Substance and Sexual Activity   Alcohol use: Yes    Alcohol/week: 5.0 standard drinks of alcohol    Types: 5 Glasses of wine per week    Comment: occasional   Drug use: Never   Sexual activity: Not Currently  Other Topics Concern   Not on file  Social History Narrative   2 grands   Retired Charity fundraiser   Social Determinants of Corporate investment banker Strain: Low Risk  (02/03/2023)   Overall Financial Resource Strain (CARDIA)    Difficulty of Paying Living Expenses: Not hard at all  Food Insecurity: No Food Insecurity (02/03/2023)   Hunger Vital Sign    Worried About Running Out of Food in the Last Year: Never true    Ran Out of Food in the Last Year: Never true  Transportation Needs: No Transportation Needs (02/03/2023)   PRAPARE - Administrator, Civil Service (Medical): No    Lack of Transportation (Non-Medical): No  Physical Activity: Inactive (02/03/2023)   Exercise Vital Sign    Days of Exercise per Week: 0 days    Minutes of Exercise per Session: 0 min  Stress: No Stress Concern Present (02/03/2023)   [employer] of Occupational Health - Occupational Stress Questionnaire    Feeling of Stress : Not at all  Social Connections: Socially Isolated (02/03/2023)   Social Connection and Isolation Panel [NHANES]    Frequency of Communication with Friends and Family: More than three times a week    Frequency of Social Gatherings with Friends and Family: Twice a week    Attends Religious Services: Never    Database administrator or Organizations: No    Attends Banker Meetings: Never    Marital Status: Widowed     Tobacco Counseling Counseling given: Not Answered   Clinical Intake:  Pre-visit preparation completed: Yes  Pain : No/denies pain     BMI - recorded: 30.08 Nutritional Status: BMI > 30  Obese Nutritional Risks: None Diabetes: No  How often do you need to have someone help you when you read instructions, pamphlets, or other written materials from your doctor or pharmacy?: 1 - Never  Diabetic?no  Interpreter Needed?: No  Information entered by :: Inetta Fermo  Sweet Jarvis, LPN   Activities of Daily Living    02/03/2023    8:38 AM  In your present state of health, do you have any difficulty performing the following activities:  Hearing? 0  Vision? 0  Difficulty concentrating or making decisions? 0  Walking or climbing stairs? 0  Dressing or bathing? 0  Doing errands, shopping? 0  Preparing Food and eating ? N  Using the Toilet? N  In the past six months, have you accidently leaked urine? N  Do you have problems with loss of bowel control? N  Managing your Medications? N  Managing your Finances? N  Housekeeping or managing your Housekeeping? N    Patient Care Team: [name], MD as PCP - General (Family Medicine)  Indicate any recent Medical Services you may have received from other than [facility] providers in the past year (date may be approximate).     Assessment:   This is a routine wellness examination for Nalleli.  Hearing/Vision screen Hearing Screening - Comments:: Pt stated  slight loss  Vision Screening - Comments:: Encouraged to follow up with eye provider   Dietary issues and exercise activities discussed: Current Exercise Habits: The patient does not participate in regular exercise at present   Goals Addressed             This Visit's Progress    Patient Stated       Not at this time        Depression Screen    02/03/2023    8:36 AM 10/26/2022   10:40 AM 05/25/2022    1:49 PM  PHQ 2/9 Scores  PHQ - 2 Score 0 0 0  PHQ- 9 Score  0 0     Fall Risk    02/03/2023    8:38 AM 10/26/2022   10:37 AM 05/25/2022    1:39 PM  Fall Risk   Falls in the past year? 0 0 0  Number falls in past yr: 0 0 0  Injury with Fall? 0 0 0  Risk for fall due to : Impaired vision No Fall Risks No Fall Risks  Follow up Falls prevention discussed Falls evaluation completed Falls evaluation completed    FALL RISK PREVENTION PERTAINING TO THE HOME:  Any stairs in or around the home? No  If so, are there any without handrails? Yes  Home free of loose throw rugs in walkways, pet beds, electrical cords, etc? Yes  Adequate lighting in your home to reduce risk of falls? Yes   ASSISTIVE DEVICES UTILIZED TO PREVENT FALLS:  Life alert? Yes  Use of a cane, walker or w/c? Yes  Grab bars in the bathroom? Yes  Shower chair or bench in shower? Yes  Elevated toilet seat or a handicapped toilet? No   TIMED UP AND GO:  Was the test performed? No .  Cognitive Function:        02/03/2023    8:39 AM  6CIT Screen  What Year? 0 points  What month? 0 points  What time? 0 points  Count back from 20 0 points  Months in reverse 0 points  Repeat phrase 0 points  Total Score 0 points    Immunizations Immunization History  Administered Date(s) Administered   Moderna Sars-Covid-2 Vaccination 10/17/2019, 11/14/2019    TDAP status: Due, Education has been provided regarding the importance of this vaccine. Advised may receive this vaccine at local pharmacy or Health Dept. Aware to provide a copy of the vaccination record  if obtained from local pharmacy or Health Dept. Verbalized acceptance and understanding.  Flu Vaccine status: Due, Education has been provided regarding the importance of this vaccine. Advised may receive this vaccine at local pharmacy or Health Dept. Aware to provide a copy of the vaccination record if obtained from local pharmacy or Health Dept. Verbalized acceptance and understanding.  Pneumococcal vaccine status: Declined,  Education has  been provided regarding the importance of this vaccine but patient still declined. Advised may receive this vaccine at local pharmacy or Health Dept. Aware to provide a copy of the vaccination record if obtained from local pharmacy or Health Dept. Verbalized acceptance and understanding.   Covid-19 vaccine status: Completed vaccines  Qualifies for Shingles Vaccine? Yes   Zostavax completed No   Shingrix Completed?: No.    Education has been provided regarding the importance of this vaccine. Patient has been advised to call insurance company to determine out of pocket expense if they have not yet received this vaccine. Advised may also receive vaccine at local pharmacy or Health Dept. Verbalized acceptance and understanding.  Screening Tests Health Maintenance  Topic Date Due   DTaP/Tdap/Td (1 - Tdap) Never done   Zoster Vaccines- Shingrix (1 of 2) Never done   COVID-19 Vaccine (3 - Moderna risk series) 12/12/2019   Pneumonia Vaccine 96+ Years old (1 of 1 - PCV) 02/03/2024 (Originally 09/29/2003)   INFLUENZA VACCINE  04/21/2023   Medicare Annual Wellness (AWV)  02/03/2024   DEXA SCAN  Completed   HPV VACCINES  Aged Out    Health Maintenance  Health Maintenance Due  Topic Date Due   DTaP/Tdap/Td (1 - Tdap) Never done   Zoster Vaccines- Shingrix (1 of 2) Never done   COVID-19 Vaccine (3 - Moderna risk series) 12/12/2019    Colorectal cancer screening: No longer required.   Mammogram status: Completed 11/11/22. Repeat every year  Bone Density status: Ordered placed by pcp. Pt provided with contact info and advised to call to schedule appt.   Additional Screening:   Vision Screening: Recommended annual ophthalmology exams for early detection of glaucoma and other disorders of the eye. Is the patient up to date with their annual eye exam?  No  Who is the provider or what is the name of the office in which the patient attends annual eye exams? Will follow up with new provider  If pt  is not established with a provider, would they like to be referred to a provider to establish care? No .   Dental Screening: Recommended annual dental exams for proper oral hygiene  Community Resource Referral / Chronic Care Management: CRR required this visit?  No   CCM required this visit?  No      Plan:     I have personally reviewed and noted the following in the patient's chart:   Medical and social history Use of alcohol, tobacco or illicit drugs  Current medications and supplements including opioid prescriptions. Patient is not currently taking opioid prescriptions. Functional ability and status Nutritional status Physical activity Advanced directives List of other physicians Hospitalizations, surgeries, and ER visits in previous 12 months Vitals Screenings to include cognitive, depression, and falls Referrals and appointments  In addition, I have reviewed and discussed with patient certain preventive protocols, quality metrics, and best practice recommendations. A written personalized care plan for preventive services as well as general preventive health recommendations were provided to patient.     Marzella Schlein, LPN   [id number]/61/[id number]   Nurse  Notes: Pt is requesting lab work more frequently than 6 months pt stated she will follow up with you via my chart to discuss

## 2023-02-03 NOTE — Patient Instructions (Signed)
Julie Lutz , Thank you for taking time to come for your Medicare Wellness Visit. I appreciate your ongoing commitment to your health goals. Please review the following plan we discussed and let me know if I can assist you in the future.   These are the goals we discussed:  Goals      Patient Stated     Not at this time         This is a list of the screening recommended for you and due dates:  Health Maintenance  Topic Date Due   DTaP/Tdap/Td vaccine (1 - Tdap) Never done   Zoster (Shingles) Vaccine (1 of 2) Never done   COVID-19 Vaccine (3 - Moderna risk series) 12/12/2019   Pneumonia Vaccine (1 of 1 - PCV) 02/03/2024*   Flu Shot  04/21/2023   Medicare Annual Wellness Visit  02/03/2024   DEXA scan (bone density measurement)  Completed   HPV Vaccine  Aged Out  *Topic was postponed. The date shown is not the original due date.    Advanced directives: Please bring a copy of your health care power of attorney and living will to the office at your convenience.  Conditions/risks identified: not at this time   Next appointment: Follow up in one year for your annual wellness visit    Preventive Care 65 Years and Older, Female Preventive care refers to lifestyle choices and visits with your health care provider that can promote health and wellness. What does preventive care include? A yearly physical exam. This is also called an annual well check. Dental exams once or twice a year. Routine eye exams. Ask your health care provider how often you should have your eyes checked. Personal lifestyle choices, including: Daily care of your teeth and gums. Regular physical activity. Eating a healthy diet. Avoiding tobacco and drug use. Limiting alcohol use. Practicing safe sex. Taking low-dose aspirin every day. Taking vitamin and mineral supplements as recommended by your health care provider. What happens during an annual well check? The services and screenings done by your health  care provider during your annual well check will depend on your age, overall health, lifestyle risk factors, and family history of disease. Counseling  Your health care provider may ask you questions about your: Alcohol use. Tobacco use. Drug use. Emotional well-being. Home and relationship well-being. Sexual activity. Eating habits. History of falls. Memory and ability to understand (cognition). Work and work Astronomer. Reproductive health. Screening  You may have the following tests or measurements: Height, weight, and BMI. Blood pressure. Lipid and cholesterol levels. These may be checked every 5 years, or more frequently if you are over 42 years old. Skin check. Lung cancer screening. You may have this screening every year starting at age 46 if you have a 30-pack-year history of smoking and currently smoke or have quit within the past 15 years. Fecal occult blood test (FOBT) of the stool. You may have this test every year starting at age 25. Flexible sigmoidoscopy or colonoscopy. You may have a sigmoidoscopy every 5 years or a colonoscopy every 10 years starting at age 65. Hepatitis C blood test. Hepatitis B blood test. Sexually transmitted disease (STD) testing. Diabetes screening. This is done by checking your blood sugar (glucose) after you have not eaten for a while (fasting). You may have this done every 1-3 years. Bone density scan. This is done to screen for osteoporosis. You may have this done starting at age 62. Mammogram. This may be done every 1-2  years. Talk to your health care provider about how often you should have regular mammograms. Talk with your health care provider about your test results, treatment options, and if necessary, the need for more tests. Vaccines  Your health care provider may recommend certain vaccines, such as: Influenza vaccine. This is recommended every year. Tetanus, diphtheria, and acellular pertussis (Tdap, Td) vaccine. You may need a Td  booster every 10 years. Zoster vaccine. You may need this after age 24. Pneumococcal 13-valent conjugate (PCV13) vaccine. One dose is recommended after age 40. Pneumococcal polysaccharide (PPSV23) vaccine. One dose is recommended after age 22. Talk to your health care provider about which screenings and vaccines you need and how often you need them. This information is not intended to replace advice given to you by your health care provider. Make sure you discuss any questions you have with your health care provider. Document Released: 10/03/2015 Document Revised: 05/26/2016 Document Reviewed: 07/08/2015 Elsevier Interactive Patient Education  2017 [employer] Prevention in the Home Falls can cause injuries. They can happen to people of all ages. There are many things you can do to make your home safe and to help prevent falls. What can I do on the outside of my home? Regularly fix the edges of walkways and driveways and fix any cracks. Remove anything that might make you trip as you walk through a door, such as a raised step or threshold. Trim any bushes or trees on the path to your home. Use bright outdoor lighting. Clear any walking paths of anything that might make someone trip, such as rocks or tools. Regularly check to see if handrails are loose or broken. Make sure that both sides of any steps have handrails. Any raised decks and porches should have guardrails on the edges. Have any leaves, snow, or ice cleared regularly. Use sand or salt on walking paths during winter. Clean up any spills in your garage right away. This includes oil or grease spills. What can I do in the bathroom? Use night lights. Install grab bars by the toilet and in the tub and shower. Do not use towel bars as grab bars. Use non-skid mats or decals in the tub or shower. If you need to sit down in the shower, use a plastic, non-slip stool. Keep the floor dry. Clean up any water that spills on the floor  as soon as it happens. Remove soap buildup in the tub or shower regularly. Attach bath mats securely with double-sided non-slip rug tape. Do not have throw rugs and other things on the floor that can make you trip. What can I do in the bedroom? Use night lights. Make sure that you have a light by your bed that is easy to reach. Do not use any sheets or blankets that are too big for your bed. They should not hang down onto the floor. Have a firm chair that has side arms. You can use this for support while you get dressed. Do not have throw rugs and other things on the floor that can make you trip. What can I do in the kitchen? Clean up any spills right away. Avoid walking on wet floors. Keep items that you use a lot in easy-to-reach places. If you need to reach something above you, use a strong step stool that has a grab bar. Keep electrical cords out of the way. Do not use floor polish or wax that makes floors slippery. If you must use wax, use non-skid floor  wax. Do not have throw rugs and other things on the floor that can make you trip. What can I do with my stairs? Do not leave any items on the stairs. Make sure that there are handrails on both sides of the stairs and use them. Fix handrails that are broken or loose. Make sure that handrails are as long as the stairways. Check any carpeting to make sure that it is firmly attached to the stairs. Fix any carpet that is loose or worn. Avoid having throw rugs at the top or bottom of the stairs. If you do have throw rugs, attach them to the floor with carpet tape. Make sure that you have a light switch at the top of the stairs and the bottom of the stairs. If you do not have them, ask someone to add them for you. What else can I do to help prevent falls? Wear shoes that: Do not have high heels. Have rubber bottoms. Are comfortable and fit you well. Are closed at the toe. Do not wear sandals. If you use a stepladder: Make sure that it is  fully opened. Do not climb a closed stepladder. Make sure that both sides of the stepladder are locked into place. Ask someone to hold it for you, if possible. Clearly mark and make sure that you can see: Any grab bars or handrails. First and last steps. Where the edge of each step is. Use tools that help you move around (mobility aids) if they are needed. These include: Canes. Walkers. Scooters. Crutches. Turn on the lights when you go into a dark area. Replace any light bulbs as soon as they burn out. Set up your furniture so you have a clear path. Avoid moving your furniture around. If any of your floors are uneven, fix them. If there are any pets around you, be aware of where they are. Review your medicines with your doctor. Some medicines can make you feel dizzy. This can increase your chance of falling. Ask your doctor what other things that you can do to help prevent falls. This information is not intended to replace advice given to you by your health care provider. Make sure you discuss any questions you have with your health care provider. Document Released: 07/03/2009 Document Revised: 02/12/2016 Document Reviewed: 10/11/2014 Elsevier Interactive Patient Education  2017 [employer].

## 2023-02-03 NOTE — Patient Instructions (Signed)
Medication Instructions:   Your physician recommends that you continue on your current medications as directed. Please refer to the Current Medication list given to you today.  *If you need a refill on your cardiac medications before your next appointment, please call your pharmacy*    Follow-Up: At [facility], you and your health needs are our priority.  As part of our continuing mission to provide you with exceptional heart care, we have created designated Provider Care Teams.  These Care Teams include your primary Cardiologist (physician) and Advanced Practice Providers (APPs -  Physician Assistants and Nurse Practitioners) who all work together to provide you with the care you need, when you need it.  We recommend signing up for the patient portal called "MyChart".  Sign up information is provided on this After Visit Summary.  MyChart is used to connect with patients for Virtual Visits (Telemedicine).  Patients are able to view lab/test results, encounter notes, upcoming appointments, etc.  Non-urgent messages can be sent to your provider as well.   To learn more about what you can do with MyChart, go to [email or web address].    Your next appointment:   1 year(s)  Provider:   Dr. Pemberton   

## 2023-02-07 ENCOUNTER — Other Ambulatory Visit: Payer: Self-pay | Admitting: *Deleted

## 2023-02-07 DIAGNOSIS — Z79899 Other long term (current) drug therapy: Secondary | ICD-10-CM

## 2023-02-07 DIAGNOSIS — D649 Anemia, unspecified: Secondary | ICD-10-CM

## 2023-02-07 MED ORDER — FUROSEMIDE 20 MG PO TABS: 20.0000 mg | ORAL_TABLET | Freq: Every day | ORAL | 3 refills | Status: DC

## 2023-02-10 ENCOUNTER — Ambulatory Visit (INDEPENDENT_AMBULATORY_CARE_PROVIDER_SITE_OTHER): Payer: Medicare Other | Admitting: Internal Medicine

## 2023-02-10 ENCOUNTER — Other Ambulatory Visit (INDEPENDENT_AMBULATORY_CARE_PROVIDER_SITE_OTHER): Payer: Medicare Other

## 2023-02-10 ENCOUNTER — Other Ambulatory Visit: Payer: Self-pay | Admitting: Pharmacist

## 2023-02-10 ENCOUNTER — Encounter: Payer: Self-pay | Admitting: Internal Medicine

## 2023-02-10 VITALS — BP 140/70 | HR 87 | Temp 98.1°F | Ht <= 58 in | Wt 141.4 lb

## 2023-02-10 DIAGNOSIS — Z79899 Other long term (current) drug therapy: Secondary | ICD-10-CM | POA: Diagnosis not present

## 2023-02-10 DIAGNOSIS — D7219 Other eosinophilia: Secondary | ICD-10-CM

## 2023-02-10 DIAGNOSIS — J4550 Severe persistent asthma, uncomplicated: Secondary | ICD-10-CM

## 2023-02-10 DIAGNOSIS — D649 Anemia, unspecified: Secondary | ICD-10-CM | POA: Diagnosis not present

## 2023-02-10 DIAGNOSIS — R490 Dysphonia: Secondary | ICD-10-CM

## 2023-02-10 DIAGNOSIS — J301 Allergic rhinitis due to pollen: Secondary | ICD-10-CM | POA: Diagnosis not present

## 2023-02-10 LAB — CBC WITH DIFFERENTIAL/PLATELET
Basophils Absolute: 0 10*3/uL (ref 0.0–0.1)
Basophils Relative: 0.2 % (ref 0.0–3.0)
Eosinophils Absolute: 0 10*3/uL (ref 0.0–0.7)
Eosinophils Relative: 0 % (ref 0.0–5.0)
HCT: 34.3 % — ABNORMAL LOW (ref 36.0–46.0)
Hemoglobin: 11.2 g/dL — ABNORMAL LOW (ref 12.0–15.0)
Lymphocytes Relative: 18.9 % (ref 12.0–46.0)
Lymphs Abs: 1.2 10*3/uL (ref 0.7–4.0)
MCHC: 32.6 g/dL (ref 30.0–36.0)
MCV: 91.8 fl (ref 78.0–100.0)
Monocytes Absolute: 0.5 10*3/uL (ref 0.1–1.0)
Monocytes Relative: 8.9 % (ref 3.0–12.0)
Neutro Abs: 4.4 10*3/uL (ref 1.4–7.7)
Neutrophils Relative %: 72 % (ref 43.0–77.0)
Platelets: 330 10*3/uL (ref 150.0–400.0)
RBC: 3.74 Mil/uL — ABNORMAL LOW (ref 3.87–5.11)
RDW: 14.4 % (ref 11.5–15.5)
WBC: 6.1 10*3/uL (ref 4.0–10.5)

## 2023-02-10 LAB — COMPREHENSIVE METABOLIC PANEL
ALT: 11 U/L (ref 0–35)
AST: 14 U/L (ref 0–37)
Albumin: 4.1 g/dL (ref 3.5–5.2)
Alkaline Phosphatase: 70 U/L (ref 39–117)
BUN: 20 mg/dL (ref 6–23)
CO2: 28 mEq/L (ref 19–32)
Calcium: 9.1 mg/dL (ref 8.4–10.5)
Chloride: 99 mEq/L (ref 96–112)
Creatinine, Ser: 1.01 mg/dL (ref 0.40–1.20)
GFR: 51.17 mL/min — ABNORMAL LOW (ref >60.00)
Glucose, Bld: 77 mg/dL (ref 70–99)
Potassium: 4.3 mEq/L (ref 3.5–5.1)
Sodium: 135 mEq/L (ref 135–145)
Total Bilirubin: 0.5 mg/dL (ref 0.2–1.2)
Total Protein: 6.7 g/dL (ref 6.0–8.3)

## 2023-02-10 MED ORDER — FASENRA PEN 30 MG/ML ~~LOC~~ SOAJ
30.0000 mg | SUBCUTANEOUS | 3 refills | Status: DC
Start: 2023-02-10 — End: 2023-10-25

## 2023-02-10 NOTE — Progress Notes (Signed)
Julie Lutz    [id number]    Aug 29, 1941  Primary Care Physician:Kulik, Maryruth Hancock, MD Date of Appointment: 02/10/2023 Established Patient Visit  Chief complaint:   Chief Complaint  Patient presents with   Follow-up    Sob with exertion.  At baseline.     HPI: Julie Lutz is a 82 y.o. 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 Has been on fasenra since about 2020.  Interval Updates: Here for follow up.  No interval exacerbations for asthma, no hospitalization.   No nebulizer use, minimal albuterol use - average 4-5 times/week.   Needs refill  on fasenra. [facility].    Current Regimen: advair 250 1 puff twice a day, spiriva once daily 1 puff daily, fasenra Asthma Triggers: URIs, exertion Exacerbations in the last year: none in the last year.  History of hospitalization or intubation:never Allergy Testing: yes  GERD: occasional, on pepcid.  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  ACT:  Asthma Control Test ACT Total Score  02/10/2023  1:22 PM 19  07/08/2022  1:09 PM 21     I have reviewed the patient's family social and past medical history and updated as appropriate.   Past Medical History:  Diagnosis Date   Allergy    Arthritis    Asthma    esosinophillic   Cancer (HCC)    vocal cord   Cataract    CHF (congestive heart failure) (HCC)    GERD (gastroesophageal reflux disease)    Hyperlipidemia    Hypertension    Hyponatremia    Osteoporosis    Spinal stenosis     Past Surgical History:  Procedure Laterality Date   FEMUR FRACTURE SURGERY Right 08/2016   HERNIA REPAIR Right    inguinal   JOINT REPLACEMENT  2017   bilateral knee   LAMINECTOMY  11/2020   L3-5   LARYNGOSCOPY  11/2021   SPINAL FUSION  03/2021    Family History  Problem Relation Age of Onset   Diabetes Sister    Cancer Sister 45       ovarian   Anemia Sister    Allergies Daughter     Asthma Neg Hx     Social History   Occupational History   Not on file  Tobacco Use   Smoking status: Former    Types: Cigarettes    Quit date: 1990    Years since quitting: 34.4    Passive exposure: Never   Smokeless tobacco: Never  Vaping Use   Vaping Use: Never used  Substance and Sexual Activity   Alcohol use: Yes    Alcohol/week: 5.0 standard drinks of alcohol    Types: 5 Glasses of wine per week    Comment: occasional   Drug use: Never   Sexual activity: Not Currently     Physical Exam: Blood pressure (!) 140/70, pulse 87, temperature 98.1 F (36.7 C), temperature source Oral, height 4\' 9"  (1.448 m), weight 141 lb 6.4 oz (64.1 kg), SpO2 99 %.  Gen:      No acute distress, hoarse voice ENT:  no nasal polyps, mucus membranes moist Lungs:   kyphosis, No increased respiratory effort, symmetric chest wall excursion, clear to auscultation bilaterally, no wheezes or crackles CV:         Regular rate and rhythm; no murmurs, rubs, or gallops.  No pedal edema   Data Reviewed: Imaging: I have personally  reviewed the   PFTs:      No data to display         I have personally reviewed the patient's PFTs and   Labs: Lab Results  Component Value Date   NA 135 10/26/2022   K 4.9 10/26/2022   CO2 21 10/26/2022   GLUCOSE 90 10/26/2022   BUN 15 10/26/2022   CREATININE 1.00 10/26/2022   CALCIUM 8.7 10/26/2022   EGFR 54 (L) 08/19/2022   GFRNONAA 59 (L) 05/06/2018   Lab Results  Component Value Date   WBC 6.4 10/26/2022   HGB 11.0 (L) 10/26/2022   HCT 32.6 (L) 10/26/2022   MCV 92.0 10/26/2022   PLT 308.0 10/26/2022  Eosinophils 400 in August 2019  Immunization status: Immunization History  Administered Date(s) Administered   [employer] Vaccination 10/17/2019, 11/14/2019    External Records Personally Reviewed:   Assessment:  Severe persistent asthma, on benralizumab GERD Chronic rhinitis H/o 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  You can try coming off spiriva - if your breathing worsens or albuterol use increases, you can go back on.  Will refill fasenra today.   Call sooner if questions or concerns about your breathing.    Return to Care: Return in about 6 months (around 08/13/2023).   Durel Salts, MD Pulmonary and Critical Care Medicine [facility] Office:[phone]

## 2023-02-10 NOTE — Patient Instructions (Addendum)
Please schedule follow up scheduled with myself in 6 months.  If my schedule is not open yet, we will contact you with a reminder closer to that time. Please call [phone] if you haven't heard from [place] a month before.   Continue the advair, albuterol and fasenra Keep up allergy meds with astelin.  You can try coming off spiriva - if your breathing worsens or albuterol use increases, you can go back on.  Will refill fasenra today.   Call sooner if questions or concerns about your breathing.

## 2023-02-10 NOTE — Progress Notes (Signed)
Refill for Fasenra sent to [facility] today  Chesley Mires, PharmD, MPH, BCPS, CPP Clinical Pharmacist (Rheumatology and Pulmonology)

## 2023-02-11 ENCOUNTER — Other Ambulatory Visit: Payer: Self-pay | Admitting: *Deleted

## 2023-02-11 ENCOUNTER — Ambulatory Visit (INDEPENDENT_AMBULATORY_CARE_PROVIDER_SITE_OTHER): Payer: Medicare Other

## 2023-02-11 ENCOUNTER — Encounter: Payer: Self-pay | Admitting: Family Medicine

## 2023-02-11 DIAGNOSIS — R790 Abnormal level of blood mineral: Secondary | ICD-10-CM

## 2023-02-11 LAB — IRON,TIBC AND FERRITIN PANEL
%SAT: 22 % (calc) (ref 16–45)
Ferritin: 81 ng/mL (ref 16–288)
Iron: 71 ug/dL (ref 45–160)
TIBC: 317 mcg/dL (calc) (ref 250–450)

## 2023-02-11 LAB — MAGNESIUM: Magnesium: 1.5 mg/dL (ref 1.5–2.5)

## 2023-02-11 NOTE — Progress Notes (Signed)
Labs are stable.

## 2023-03-03 ENCOUNTER — Ambulatory Visit (INDEPENDENT_AMBULATORY_CARE_PROVIDER_SITE_OTHER): Payer: Medicare Other | Admitting: Family

## 2023-03-03 ENCOUNTER — Encounter: Payer: Self-pay | Admitting: Family

## 2023-03-03 ENCOUNTER — Encounter: Payer: Self-pay | Admitting: Family Medicine

## 2023-03-03 VITALS — BP 125/75 | HR 68 | Temp 97.8°F | Ht <= 58 in | Wt 144.4 lb

## 2023-03-03 DIAGNOSIS — M5416 Radiculopathy, lumbar region: Secondary | ICD-10-CM | POA: Diagnosis not present

## 2023-03-03 MED ORDER — PREDNISONE 10 MG PO TABS
ORAL_TABLET | ORAL | 0 refills | Status: DC
Start: 2023-03-03 — End: 2023-03-30

## 2023-03-03 MED ORDER — METHOCARBAMOL 500 MG PO TABS
500.0000 mg | ORAL_TABLET | Freq: Three times a day (TID) | ORAL | 0 refills | Status: DC | PRN
Start: 2023-03-03 — End: 2023-07-12

## 2023-03-03 NOTE — Progress Notes (Signed)
Patient ID: Julie Lutz, female    DOB: [birth year], 82 y.o.   MRN: [id number]  Chief Complaint  Patient presents with   Back Pain    Pt c/o right back and side pain since last Tuesday, Has tried aleve, tylenol and heat which does help slightly.     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 Pain is dull ache that radiates down her right buttock and leg. Pt denies any tingling or numbness, just dull pain, sometimes burning pain. Pt does sit in a chair a lot at home. Denies any recent long car or airplane rides. Uses walker at home normally and a cane when outside the home, but can only stand for short time or walk for very short distances due to chronic pain all through her spine. Pt brought in her last Ortho notes from provider in [place].  Assessment & Plan:  1. Lumbar radiculopathy sending prednisone pack w/Robaxin, advised pt and niece on use & SE, ok to continue Tylenol & heating pad tid prn, advised on using a pressure relieving cushion in chair at home, can find at local medical supply stores. Try to walk a little every hour, have to stand and stretch as much as possible. RTO precautions given.  - predniSONE (DELTASONE) 10 MG tablet; Take in the morning.  5 tab day 1, 4 tab day 2-3, 3 tab day 4, 2 tab day 5  Dispense: 18 tablet; Refill: 0 - methocarbamol (ROBAXIN) 500 MG tablet; Take 1-2 tablets (500-1,000 mg total) by mouth every 8 (eight) hours as needed for muscle spasms (May cause drowsiness.).  Dispense: 30 tablet; Refill: 0   Subjective:    Outpatient Medications Prior to Visit  Medication Sig Dispense Refill   albuterol (PROVENTIL HFA) 108 (90 Base) MCG/ACT inhaler Inhale 1-2 puffs into the lungs every 6 (six) hours as needed for wheezing or shortness of breath.     albuterol (PROVENTIL) (2.5 MG/3ML) 0.083% nebulizer solution Take 3 mLs (2.5 mg total) by  nebulization every 6 (six) hours as needed for wheezing or shortness of breath. 75 mL 12   ALPRAZolam (XANAX) 0.5 MG tablet Take 1 tablet (0.5 mg total) by mouth at bedtime as needed for anxiety. 90 tablet 1   amLODipine (NORVASC) 2.5 MG tablet Take 1 tablet (2.5 mg total) by mouth daily. 90 tablet 3   atorvastatin (LIPITOR) 10 MG tablet Take 1 tablet (10 mg total) by mouth daily. 90 tablet 3   azelastine (ASTELIN) 0.1 % nasal spray Place 2 sprays into both nostrils 2 (two) times daily. 30 mL 12   Benralizumab (FASENRA PEN) 30 MG/ML SOAJ Inject 1 mL (30 mg total) into the skin every 8 (eight) weeks. 1 mL 3   carvedilol (COREG) 25 MG tablet carvedilol 25 mg tablet  ONE BY MOUTH TWICE DAILY     Coenzyme Q10 (COQ-10 PO) Take by mouth.     Cyanocobalamin (VITAMIN B 12 PO) Take by mouth.     fluticasone-salmeterol (ADVAIR) 250-50 MCG/ACT AEPB Inhale 1 puff into the lungs in the morning and at bedtime. 180 each 3   furosemide (LASIX) 20 MG tablet Take 1 tablet (20 mg total) by mouth daily. 90 tablet 3   MAGNESIUM CITRATE PO Take 1 tablet by mouth daily.     mirabegron ER (MYRBETRIQ)  50 MG TB24 tablet Take 1 tablet (50 mg total) by mouth daily. 90 tablet 3   mirtazapine (REMERON) 7.5 MG tablet Take 7.5 mg by mouth at bedtime.     PREVACID 30 MG capsule Take 1 capsule (30 mg total) by mouth daily at 12 noon. 90 capsule 1   Tiotropium Bromide Monohydrate (SPIRIVA RESPIMAT) 1.25 MCG/ACT AERS one time daily. .     Vitamin D, Ergocalciferol, 50 MCG (2000 UT) CAPS Take by mouth.     No facility-administered medications prior to visit.   Past Medical History:  Diagnosis Date   Allergy    Arthritis    Asthma    esosinophillic   Cancer (HCC)    vocal cord   Cataract    CHF (congestive heart failure) (HCC)    GERD (gastroesophageal reflux disease)    Hyperlipidemia    Hypertension    Hyponatremia    Osteoporosis    Spinal stenosis    Past Surgical History:  Procedure Laterality Date   FEMUR  FRACTURE SURGERY Right 08/2016   HERNIA REPAIR Right    inguinal   JOINT REPLACEMENT  2017   bilateral knee   LAMINECTOMY  11/2020   L3-5   LARYNGOSCOPY  11/2021   SPINAL FUSION  03/2021   No Known Allergies    Objective:    Physical Exam Vitals and nursing note reviewed.  Constitutional:      Appearance: Normal appearance.  Cardiovascular:     Rate and Rhythm: Normal rate and regular rhythm.  Pulmonary:     Effort: Pulmonary effort is normal.     Breath sounds: Normal breath sounds.  Musculoskeletal:        General: Normal range of motion.  Skin:    General: Skin is warm and dry.  Neurological:     Mental Status: She is alert and oriented to person, place, and time.     Motor: Weakness present.     Gait: Gait abnormal.  Psychiatric:        Mood and Affect: Mood normal.        Behavior: Behavior normal.    BP 125/75 (BP Location: Left Arm, Patient Position: Sitting, Cuff Size: Normal)   Pulse 68   Temp 97.8 F (36.6 C) (Temporal)   Ht 4\' 9"  (1.448 m)   Wt 144 lb 6.4 oz (65.5 kg)   SpO2 95%   BMI 31.25 kg/m  Wt Readings from Last 3 Encounters:  03/03/23 144 lb 6.4 oz (65.5 kg)  02/10/23 141 lb 6.4 oz (64.1 kg)  02/03/23 142 lb 3.2 oz (64.5 kg)       [name], NP

## 2023-03-04 ENCOUNTER — Other Ambulatory Visit: Payer: Self-pay | Admitting: *Deleted

## 2023-03-04 DIAGNOSIS — M5416 Radiculopathy, lumbar region: Secondary | ICD-10-CM

## 2023-03-17 ENCOUNTER — Ambulatory Visit (INDEPENDENT_AMBULATORY_CARE_PROVIDER_SITE_OTHER): Payer: Medicare Other | Admitting: Family Medicine

## 2023-03-17 ENCOUNTER — Ambulatory Visit: Payer: Medicare Other | Admitting: Family

## 2023-03-17 VITALS — BP 128/70 | HR 72 | Temp 98.0°F | Ht <= 58 in | Wt 143.2 lb

## 2023-03-17 DIAGNOSIS — D649 Anemia, unspecified: Secondary | ICD-10-CM

## 2023-03-17 DIAGNOSIS — R5383 Other fatigue: Secondary | ICD-10-CM | POA: Diagnosis not present

## 2023-03-17 DIAGNOSIS — R110 Nausea: Secondary | ICD-10-CM

## 2023-03-17 DIAGNOSIS — Z8639 Personal history of other endocrine, nutritional and metabolic disease: Secondary | ICD-10-CM | POA: Diagnosis not present

## 2023-03-17 DIAGNOSIS — R8290 Unspecified abnormal findings in urine: Secondary | ICD-10-CM

## 2023-03-17 NOTE — Patient Instructions (Signed)
Thank you for coming in to see [place] today.  I will check your electrolytes including sodium, potassium, magnesium as well as blood counts and liver function testing.  Without other symptoms other than the odor in the urine, it is unlikely a urinary tract infection but I will check some tests for this as well.    Make sure to continue to drink sufficient fluids, regular meals with Zofran only if needed for nausea as that can worsen constipation.  Labs should be back by tomorrow but if any new or worsening symptoms in the meantime be seen as we discussed including ER if any acute worsening.  Take care.   Fatigue If you have fatigue, you feel tired all the time and have a lack of energy or a lack of motivation. Fatigue may make it difficult to start or complete tasks because of exhaustion. Occasional or mild fatigue is often a normal response to activity or life. However, long-term (chronic) or extreme fatigue may be a symptom of a medical condition such as: Depression. Not having enough red blood cells or hemoglobin in the blood (anemia). A problem with a small gland located in the lower front part of the neck (thyroid disorder). Rheumatologic conditions. These are problems related to the body's defense system (immune system). Infections, especially certain viral infections. Fatigue can also lead to negative health outcomes over time. Follow these instructions at home: Medicines Take over-the-counter and prescription medicines only as told by your health care provider. Take a multivitamin if told by your health care provider. Do not use herbal or dietary supplements unless they are approved by your health care provider. Eating and drinking  Avoid heavy meals in the evening. Eat a well-balanced diet, which includes lean proteins, whole grains, plenty of fruits and vegetables, and low-fat dairy products. Avoid eating or drinking too many products with caffeine in them. Avoid alcohol. Drink enough  fluid to keep your urine pale yellow. Activity  Exercise regularly, as told by your health care provider. Use or practice techniques to help you relax, such as yoga, tai chi, meditation, or massage therapy. Lifestyle Change situations that cause you stress. Try to keep your work and personal schedules in balance. Do not use recreational or illegal drugs. General instructions Monitor your fatigue for any changes. Go to bed and get up at the same time every day. Avoid fatigue by pacing yourself during the day and getting enough sleep at night. Maintain a healthy weight. Contact a health care provider if: Your fatigue does not get better. You have a fever. You suddenly lose or gain weight. You have headaches. You have trouble falling asleep or sleeping through the night. You feel angry, guilty, anxious, or sad. You have swelling in your legs or another part of your body. Get help right away if: You feel confused, feel like you might faint, or faint. Your vision is blurry or you have a severe headache. You have severe pain in your abdomen, your back, or the area between your waist and hips (pelvis). You have chest pain, shortness of breath, or an irregular or fast heartbeat. You are unable to urinate, or you urinate less than normal. You have abnormal bleeding from the rectum, nose, lungs, nipples, or, if you are female, the vagina. You vomit blood. You have thoughts about hurting yourself or others. These symptoms may be an emergency. Get help right away. Call 911. Do not wait to see if the symptoms will go away. Do not drive yourself to  the hospital. Get help right away if you feel like you may hurt yourself or others, or have thoughts about taking your own life. Go to your nearest emergency room or: Call 911. Call the National Suicide Prevention Lifeline at 618-264-2270 or 988. This is open 24 hours a day. Text the Crisis Text Line at 541-498-8560. Summary If you have fatigue, you  feel tired all the time and have a lack of energy or a lack of motivation. Fatigue may make it difficult to start or complete tasks because of exhaustion. Long-term (chronic) or extreme fatigue may be a symptom of a medical condition. Exercise regularly, as told by your health care provider. Change situations that cause you stress. Try to keep your work and personal schedules in balance. This information is not intended to replace advice given to you by your health care provider. Make sure you discuss any questions you have with your health care provider. Document Revised: 06/29/2021 Document Reviewed: 06/29/2021 [employer] Patient Education  2024 [employer] Nausea, Adult Nausea is the feeling of having an upset stomach or that you are about to vomit. Nausea on its own is not usually a serious concern, but it may be an early sign of a more serious medical problem. As nausea gets worse, it can lead to vomiting. If vomiting develops, or if you are not able to drink enough fluids, you are at risk of becoming dehydrated. Dehydration can make you tired and thirsty, cause you to have a dry mouth, and decrease how often you urinate. Older adults and people with other diseases or a weak disease-fighting system (immune system) are at higher risk for dehydration. The main goals of treating your nausea are: To relieve your nausea. To limit repeated nausea episodes. To prevent vomiting and dehydration. Follow these instructions at home: Watch your symptoms for any changes. Tell your health care provider about them. Eating and drinking     Take an oral rehydration solution (ORS). This is a drink that is sold at pharmacies and retail stores. Drink clear fluids slowly and in small amounts as you are able. Clear fluids include water, ice chips, low-calorie sports drinks, and fruit juice that has water added (diluted fruit juice). Eat bland, easy-to-digest foods in small amounts as you are able. These foods  include bananas, applesauce, rice, lean meats, toast, and crackers. Avoid drinking fluids that contain a lot of sugar or caffeine, such as energy drinks, sports drinks, and soda. Avoid alcohol. Avoid spicy or fatty foods. General instructions Take over-the-counter and prescription medicines only as told by your health care provider. Rest at home while you recover. Drink enough fluid to keep your urine pale yellow. Breathe slowly and deeply when you feel nauseous. Avoid smelling things that have strong odors. Wash your hands often using soap and water for at least 20 seconds. If soap and water are not available, use hand sanitizer. Make sure that everyone in your household washes their hands well and often. Keep all follow-up visits. This is important. Contact a health care provider if: Your nausea gets worse. Your nausea does not go away after two days. You vomit multiple times. You cannot drink fluids without vomiting. You have any of the following: New symptoms. A fever. A headache. Muscle cramps. A rash. Pain while urinating. You feel light-headed or dizzy. Get help right away if: You have pain in your chest, neck, arm, or jaw. You feel extremely weak or you faint. You have vomit that is bright red or looks  like coffee grounds. You have bloody or black stools (feces) or stools that look like tar. You have a severe headache, a stiff neck, or both. You have severe pain, cramping, or bloating in your abdomen. You have difficulty breathing or are breathing very quickly. Your heart is beating very quickly. Your skin feels cold and clammy. You feel confused. You have signs of dehydration, such as: Dark urine, very little urine, or no urine. Cracked lips. Dry mouth. Sunken eyes. Sleepiness. Weakness. These symptoms may be an emergency. Get help right away. Call 911. Do not wait to see if the symptoms will go away. Do not drive yourself to the hospital. Summary Nausea is  the feeling that you have an upset stomach or that you are about to vomit. Nausea on its own is not usually a serious concern, but it may be an early sign of a more serious medical problem. If vomiting develops, or if you are not able to drink enough fluids, you are at risk of becoming dehydrated. Follow recommendations for eating and drinking and take over-the-counter and prescription medicines only as told by your health care provider. Contact a health care provider right away if your symptoms worsen or you have new symptoms. Keep all follow-up visits. This is important. This information is not intended to replace advice given to you by your health care provider. Make sure you discuss any questions you have with your health care provider. Document Revised: 03/13/2021 Document Reviewed: 03/13/2021 [employer] Patient Education  2024 [employer].

## 2023-03-17 NOTE — Progress Notes (Signed)
Subjective:  Patient ID: Julie Lutz, female    DOB: [birth year]  Age: 82 y.o. MRN: [id number]  CC:  Chief Complaint  Patient presents with   Nausea    One week has had fatigue and nausea no appetite, notes hxt low sodium potassium and magnesium has been hospitalized for this,  notes no other symptoms has not vomited at this time     HPI [facility] presents acutely for  Nausea with fatigue, decreased appetite Noticed past 1 week. Fatigue, loss of appetite, nausea past week. No abdominal pain or vomiting.  No known sick contacts.  Travel to [facility] June 15th for Grandson's wedding. Above symptoms a week later.  No cough/fever. No new cough. Asthma stable. No new dyspnea.  Denies chest pain or palpitations, syncope or near syncopal symptoms.  Denies slurred speech, focal weakness or slurred speech. Constipation for about a week - that is improving with pericolace.normal BM this am.  No recent tick bites/exposure.  No recent food changes/adventurous eating.  No dysuria/change in urination. Change in smell? No burning.  No current narcotics. Took zofran once past week.   Reports history of hyponatremia, hypokalemia, hypomagnesemia, and anemia.  Last labs reviewed from May 23 with normal potassium, sodium at that time, borderline at 135.  Magnesium normal on May 24 at 1.5.  HGB 11.2 on 5/23, usual range 10.7 to 11.4 since 05/2022.   Taking magnesium otc BID. No potassium supplements.   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   OAB  (overactive bladder) 05/25/2022   Primary insomnia 05/25/2022   Sepsis (HCC) 05/03/2018   Past Medical History:  Diagnosis Date   Allergy    Arthritis    Asthma    esosinophillic   Cancer (HCC)    vocal cord   Cataract    CHF (congestive heart failure) (HCC)    GERD (gastroesophageal reflux disease)    Hyperlipidemia    Hypertension    Hyponatremia    Osteoporosis    Spinal stenosis    Past Surgical History:  Procedure Laterality Date   FEMUR FRACTURE SURGERY Right 08/2016   HERNIA REPAIR Right    inguinal   JOINT REPLACEMENT  2017   bilateral knee   LAMINECTOMY  11/2020   L3-5   LARYNGOSCOPY  11/2021   SPINAL FUSION  03/2021   No Known Allergies Prior to Admission medications   Medication Sig Start Date End Date Taking? Authorizing Provider  albuterol (PROVENTIL HFA) 108 (90 Base) MCG/ACT inhaler Inhale 1-2 puffs into the lungs every 6 (six) hours as needed for wheezing or shortness of breath.   Yes [provider]  albuterol (PROVENTIL) (2.5 MG/3ML) 0.083% nebulizer solution Take 3 mLs (2.5 mg total) by nebulization every 6 (six) hours as needed for wheezing or shortness of breath. 07/30/22  Yes [name], MD  ALPRAZolam Prudy Feeler) 0.5 MG tablet Take 1 tablet (0.5 mg total) by mouth at bedtime as needed for anxiety. 02/01/23  Yes [name], MD  amLODipine (NORVASC) 2.5 MG tablet Take 1 tablet (2.5 mg total) by mouth daily. 02/03/23  Yes [name], MD  atorvastatin (LIPITOR) 10 MG tablet Take 1 tablet (10 mg total) by mouth daily. 02/03/23  Yes [name], MD  azelastine (ASTELIN) 0.1 % nasal spray Place 2 sprays into both nostrils 2 (two) times daily. 11/23/22  Yes [name], MD  Benralizumab [facility] PEN) 30 MG/ML SOAJ Inject 1 mL (30 mg total) into the skin every 8 (eight) weeks. 02/10/23  Yes [name], MD  carvedilol (COREG) 25 MG tablet carvedilol 25 mg tablet  ONE BY MOUTH TWICE DAILY   Yes [provider]   Coenzyme Q10 (COQ-10 PO) Take by mouth.   Yes [provider]  Cyanocobalamin (VITAMIN B 12 PO) Take by mouth.   Yes [provider]  fluticasone-salmeterol (ADVAIR) 250-50 MCG/ACT AEPB Inhale 1 puff into the lungs in the morning and at bedtime. 08/19/22  Yes [name], MD  furosemide (LASIX) 20 MG tablet Take 1 tablet (20 mg total) by mouth daily. 02/07/23  Yes [name], MD  MAGNESIUM CITRATE PO Take 1 tablet by mouth daily.   Yes [provider]  methocarbamol (ROBAXIN) 500 MG tablet Take 1-2 tablets (500-1,000 mg total) by mouth every 8 (eight) hours as needed for muscle spasms (May cause drowsiness.). 03/03/23  Yes [name], NP  mirabegron ER (MYRBETRIQ) 50 MG TB24 tablet Take 1 tablet (50 mg total) by mouth daily. 05/25/22  Yes [name], MD  mirtazapine (REMERON) 7.5 MG tablet Take 7.5 mg by mouth at bedtime. 09/02/22  Yes [provider]  predniSONE (DELTASONE) 10 MG tablet Take in the morning.  5 tab day 1, 4 tab day 2-3, 3 tab day 4, 2 tab day 5 03/03/23  Yes [name], Stephanie, NP  PREVACID 30 MG capsule Take 1 capsule (30 mg total) by mouth daily at 12 noon. 10/26/22  Yes [name], MD  Tiotropium Bromide Monohydrate (SPIRIVA RESPIMAT) 1.25 MCG/ACT AERS one time daily. . 10/26/19  Yes [provider]  Vitamin D, Ergocalciferol, 50 MCG (2000 UT) CAPS Take by mouth.   Yes [provider]   Social History   Socioeconomic History   Marital status: Widowed    Spouse name: Not on file   Number of children: 2   Years of education: Not on file   Highest education level: Not on file  Occupational History   Not on file  Tobacco Use   Smoking status: Former    Types: Cigarettes    Quit date: 1990    Years since quitting: 34.5    Passive exposure: Never   Smokeless tobacco: Never  Vaping Use   Vaping Use: Never used  Substance and Sexual Activity   Alcohol use: Yes    Alcohol/week: 5.0 standard  drinks of alcohol    Types: 5 Glasses of wine per week    Comment: occasional   Drug use: Never   Sexual activity: Not Currently  Other Topics Concern   Not on file  Social History Narrative   2 grands   Retired Charity fundraiser   Social Determinants of Corporate investment banker Strain: Low Risk  (02/03/2023)   Overall Financial Resource Strain (CARDIA)    Difficulty of Paying Living Expenses: Not hard at all  Food Insecurity: No Food Insecurity (02/03/2023)   Hunger Vital Sign    Worried About Running Out of Food in the Last Year: Never true    Ran Out of Food in the Last Year: Never true  Transportation Needs: No Transportation Needs (  02/03/2023)   PRAPARE - Administrator, Civil Service (Medical): No    Lack of Transportation (Non-Medical): No  Physical Activity: Inactive (02/03/2023)   Exercise Vital Sign    Days of Exercise per Week: 0 days    Minutes of Exercise per Session: 0 min  Stress: No Stress Concern Present (02/03/2023)   [employer] of Occupational Health - Occupational Stress Questionnaire    Feeling of Stress : Not at all  Social Connections: Socially Isolated (02/03/2023)   Social Connection and Isolation Panel [NHANES]    Frequency of Communication with Friends and Family: More than three times a week    Frequency of Social Gatherings with Friends and Family: Twice a week    Attends Religious Services: Never    Database administrator or Organizations: No    Attends Banker Meetings: Never    Marital Status: Widowed  Intimate Partner Violence: Not At Risk (02/03/2023)   Humiliation, Afraid, Rape, and Kick questionnaire    Fear of Current or Ex-Partner: No    Emotionally Abused: No    Physically Abused: No    Sexually Abused: No    Review of Systems   Objective:   Vitals:   03/17/23 1501  BP: 128/70  Pulse: 72  Temp: 98 F (36.7 C)  TempSrc: Temporal  SpO2: 95%  Weight: 143 lb 3.2 oz (65 kg)  Height: 4\' 9"  (1.448 m)      Physical Exam Vitals reviewed.  Constitutional:      Appearance: Normal appearance. She is well-developed.  HENT:     Head: Normocephalic and atraumatic.  Eyes:     Conjunctiva/sclera: Conjunctivae normal.     Pupils: Pupils are equal, round, and reactive to light.  Neck:     Vascular: No carotid bruit.  Cardiovascular:     Rate and Rhythm: Normal rate and regular rhythm.     Heart sounds: Normal heart sounds.  Pulmonary:     Effort: Pulmonary effort is normal.     Breath sounds: Normal breath sounds.  Abdominal:     General: Abdomen is flat. There is no distension.     Palpations: Abdomen is soft. There is no pulsatile mass.     Tenderness: There is no abdominal tenderness. There is no guarding.  Musculoskeletal:     Right lower leg: No edema.     Left lower leg: No edema.  Skin:    General: Skin is warm and dry.  Neurological:     Mental Status: She is alert and oriented to person, place, and time.  Psychiatric:        Mood and Affect: Mood normal.        Behavior: Behavior normal.      Assessment & Plan:  Julie Lutz is a 82 y.o. female . Nausea - Plan: Comprehensive metabolic panel, CBC  Fatigue, unspecified type - Plan: Comprehensive metabolic panel, CBC, Magnesium  History of hyperkalemia - Plan: Comprehensive metabolic panel  Hypomagnesemia - Plan: Magnesium  Anemia, unspecified type - Plan: CBC  Abnormal urine odor - Plan: Urine Culture, POCT urinalysis dipstick  Slight nausea, fatigue as above.  Concerned with hyperkalemia, hypokalemia, hypomagnesemia in the past as well as anemia.  Check CBC, CMP, magnesium as above.  Maintenance of hydration, regular meals.  Abnormal urine odor without other urinary tract infection symptoms.  Did agree to check urinalysis and urine culture but less likely infection and discussed potential for asymptomatic bacteriuria.  Further plans depending  on lab results.  Reassuring exam.  RTC/ER precautions.   No orders of  the defined types were placed in this encounter.  Patient Instructions  Thank you for coming in to see [place] today.  I will check your electrolytes including sodium, potassium, magnesium as well as blood counts and liver function testing.  Without other symptoms other than the odor in the urine, it is unlikely a urinary tract infection but I will check some tests for this as well.    Make sure to continue to drink sufficient fluids, regular meals with Zofran only if needed for nausea as that can worsen constipation.  Labs should be back by tomorrow but if any new or worsening symptoms in the meantime be seen as we discussed including ER if any acute worsening.  Take care.   Fatigue If you have fatigue, you feel tired all the time and have a lack of energy or a lack of motivation. Fatigue may make it difficult to start or complete tasks because of exhaustion. Occasional or mild fatigue is often a normal response to activity or life. However, long-term (chronic) or extreme fatigue may be a symptom of a medical condition such as: Depression. Not having enough red blood cells or hemoglobin in the blood (anemia). A problem with a small gland located in the lower front part of the neck (thyroid disorder). Rheumatologic conditions. These are problems related to the body's defense system (immune system). Infections, especially certain viral infections. Fatigue can also lead to negative health outcomes over time. Follow these instructions at home: Medicines Take over-the-counter and prescription medicines only as told by your health care provider. Take a multivitamin if told by your health care provider. Do not use herbal or dietary supplements unless they are approved by your health care provider. Eating and drinking  Avoid heavy meals in the evening. Eat a well-balanced diet, which includes lean proteins, whole grains, plenty of fruits and vegetables, and low-fat dairy products. Avoid eating or  drinking too many products with caffeine in them. Avoid alcohol. Drink enough fluid to keep your urine pale yellow. Activity  Exercise regularly, as told by your health care provider. Use or practice techniques to help you relax, such as yoga, tai chi, meditation, or massage therapy. Lifestyle Change situations that cause you stress. Try to keep your work and personal schedules in balance. Do not use recreational or illegal drugs. General instructions Monitor your fatigue for any changes. Go to bed and get up at the same time every day. Avoid fatigue by pacing yourself during the day and getting enough sleep at night. Maintain a healthy weight. Contact a health care provider if: Your fatigue does not get better. You have a fever. You suddenly lose or gain weight. You have headaches. You have trouble falling asleep or sleeping through the night. You feel angry, guilty, anxious, or sad. You have swelling in your legs or another part of your body. Get help right away if: You feel confused, feel like you might faint, or faint. Your vision is blurry or you have a severe headache. You have severe pain in your abdomen, your back, or the area between your waist and hips (pelvis). You have chest pain, shortness of breath, or an irregular or fast heartbeat. You are unable to urinate, or you urinate less than normal. You have abnormal bleeding from the rectum, nose, lungs, nipples, or, if you are female, the vagina. You vomit blood. You have thoughts about hurting yourself or others. These  symptoms may be an emergency. Get help right away. Call 911. Do not wait to see if the symptoms will go away. Do not drive yourself to the hospital. Get help right away if you feel like you may hurt yourself or others, or have thoughts about taking your own life. Go to your nearest emergency room or: Call 911. Call the National Suicide Prevention Lifeline at (620) 285-5426 or 988. This is open 24 hours a  day. Text the Crisis Text Line at 331-853-1772. Summary If you have fatigue, you feel tired all the time and have a lack of energy or a lack of motivation. Fatigue may make it difficult to start or complete tasks because of exhaustion. Long-term (chronic) or extreme fatigue may be a symptom of a medical condition. Exercise regularly, as told by your health care provider. Change situations that cause you stress. Try to keep your work and personal schedules in balance. This information is not intended to replace advice given to you by your health care provider. Make sure you discuss any questions you have with your health care provider. Document Revised: 06/29/2021 Document Reviewed: 06/29/2021 Elsevier Patient Education  2024 Elsevier Inc. Nausea, Adult Nausea is the feeling of having an upset stomach or that you are about to vomit. Nausea on its own is not usually a serious concern, but it may be an early sign of a more serious medical problem. As nausea gets worse, it can lead to vomiting. If vomiting develops, or if you are not able to drink enough fluids, you are at risk of becoming dehydrated. Dehydration can make you tired and thirsty, cause you to have a dry mouth, and decrease how often you urinate. Older adults and people with other diseases or a weak disease-fighting system (immune system) are at higher risk for dehydration. The main goals of treating your nausea are: To relieve your nausea. To limit repeated nausea episodes. To prevent vomiting and dehydration. Follow these instructions at home: Watch your symptoms for any changes. Tell your health care provider about them. Eating and drinking     Take an oral rehydration solution (ORS). This is a drink that is sold at pharmacies and retail stores. Drink clear fluids slowly and in small amounts as you are able. Clear fluids include water, ice chips, low-calorie sports drinks, and fruit juice that has water added (diluted fruit  juice). Eat bland, easy-to-digest foods in small amounts as you are able. These foods include bananas, applesauce, rice, lean meats, toast, and crackers. Avoid drinking fluids that contain a lot of sugar or caffeine, such as energy drinks, sports drinks, and soda. Avoid alcohol. Avoid spicy or fatty foods. General instructions Take over-the-counter and prescription medicines only as told by your health care provider. Rest at home while you recover. Drink enough fluid to keep your urine pale yellow. Breathe slowly and deeply when you feel nauseous. Avoid smelling things that have strong odors. Wash your hands often using soap and water for at least 20 seconds. If soap and water are not available, use hand sanitizer. Make sure that everyone in your household washes their hands well and often. Keep all follow-up visits. This is important. Contact a health care provider if: Your nausea gets worse. Your nausea does not go away after two days. You vomit multiple times. You cannot drink fluids without vomiting. You have any of the following: New symptoms. A fever. A headache. Muscle cramps. A rash. Pain while urinating. You feel light-headed or dizzy. Get help right away  if: You have pain in your chest, neck, arm, or jaw. You feel extremely weak or you faint. You have vomit that is bright red or looks like coffee grounds. You have bloody or black stools (feces) or stools that look like tar. You have a severe headache, a stiff neck, or both. You have severe pain, cramping, or bloating in your abdomen. You have difficulty breathing or are breathing very quickly. Your heart is beating very quickly. Your skin feels cold and clammy. You feel confused. You have signs of dehydration, such as: Dark urine, very little urine, or no urine. Cracked lips. Dry mouth. Sunken eyes. Sleepiness. Weakness. These symptoms may be an emergency. Get help right away. Call 911. Do not wait to see if  the symptoms will go away. Do not drive yourself to the hospital. Summary Nausea is the feeling that you have an upset stomach or that you are about to vomit. Nausea on its own is not usually a serious concern, but it may be an early sign of a more serious medical problem. If vomiting develops, or if you are not able to drink enough fluids, you are at risk of becoming dehydrated. Follow recommendations for eating and drinking and take over-the-counter and prescription medicines only as told by your health care provider. Contact a health care provider right away if your symptoms worsen or you have new symptoms. Keep all follow-up visits. This is important. This information is not intended to replace advice given to you by your health care provider. Make sure you discuss any questions you have with your health care provider. Document Revised: 03/13/2021 Document Reviewed: 03/13/2021 Elsevier Patient Education  2024 Elsevier Inc.     Signed,   Meredith Staggers, MD [facility], [facility] 03/17/23 3:13 PM

## 2023-03-18 ENCOUNTER — Other Ambulatory Visit: Payer: Self-pay | Admitting: Family Medicine

## 2023-03-18 ENCOUNTER — Encounter: Payer: Self-pay | Admitting: Family Medicine

## 2023-03-18 DIAGNOSIS — E871 Hypo-osmolality and hyponatremia: Secondary | ICD-10-CM

## 2023-03-18 LAB — CBC
HCT: 34.1 % — ABNORMAL LOW (ref 36.0–46.0)
Hemoglobin: 11.1 g/dL — ABNORMAL LOW (ref 12.0–15.0)
MCHC: 32.6 g/dL (ref 30.0–36.0)
MCV: 90.9 fl (ref 78.0–100.0)
Platelets: 386 10*3/uL (ref 150.0–400.0)
RBC: 3.75 Mil/uL — ABNORMAL LOW (ref 3.87–5.11)
RDW: 14.4 % (ref 11.5–15.5)
WBC: 11.5 10*3/uL — ABNORMAL HIGH (ref 4.0–10.5)

## 2023-03-18 LAB — COMPREHENSIVE METABOLIC PANEL
ALT: 15 U/L (ref 0–35)
AST: 15 U/L (ref 0–37)
Albumin: 4.3 g/dL (ref 3.5–5.2)
Alkaline Phosphatase: 75 U/L (ref 39–117)
BUN: 14 mg/dL (ref 6–23)
CO2: 29 mEq/L (ref 19–32)
Calcium: 9.7 mg/dL (ref 8.4–10.5)
Chloride: 91 mEq/L — ABNORMAL LOW (ref 96–112)
Creatinine, Ser: 0.88 mg/dL (ref 0.40–1.20)
GFR: 60.33 mL/min (ref >60.00)
Glucose, Bld: 91 mg/dL (ref 70–99)
Potassium: 4.8 mEq/L (ref 3.5–5.1)
Sodium: 128 mEq/L — ABNORMAL LOW (ref 135–145)
Total Bilirubin: 0.6 mg/dL (ref 0.2–1.2)
Total Protein: 6.9 g/dL (ref 6.0–8.3)

## 2023-03-18 LAB — MAGNESIUM: Magnesium: 1.6 mg/dL (ref 1.5–2.5)

## 2023-03-18 NOTE — Progress Notes (Signed)
See lab notes

## 2023-03-19 ENCOUNTER — Emergency Department (HOSPITAL_BASED_OUTPATIENT_CLINIC_OR_DEPARTMENT_OTHER)
Admission: EM | Admit: 2023-03-19 | Discharge: 2023-03-19 | Disposition: A | Discharge status: Home / Self Care | Payer: Medicare Other | Attending: Emergency Medicine | Admitting: Emergency Medicine

## 2023-03-19 ENCOUNTER — Other Ambulatory Visit: Payer: Self-pay

## 2023-03-19 ENCOUNTER — Encounter: Payer: Self-pay | Admitting: Family Medicine

## 2023-03-19 ENCOUNTER — Emergency Department (HOSPITAL_BASED_OUTPATIENT_CLINIC_OR_DEPARTMENT_OTHER): Payer: Medicare Other

## 2023-03-19 ENCOUNTER — Other Ambulatory Visit: Payer: Self-pay | Admitting: Family Medicine

## 2023-03-19 ENCOUNTER — Encounter (HOSPITAL_BASED_OUTPATIENT_CLINIC_OR_DEPARTMENT_OTHER): Payer: Self-pay | Admitting: Emergency Medicine

## 2023-03-19 DIAGNOSIS — E871 Hypo-osmolality and hyponatremia: Secondary | ICD-10-CM | POA: Insufficient documentation

## 2023-03-19 DIAGNOSIS — R5383 Other fatigue: Secondary | ICD-10-CM | POA: Diagnosis present

## 2023-03-19 DIAGNOSIS — N3000 Acute cystitis without hematuria: Secondary | ICD-10-CM | POA: Diagnosis not present

## 2023-03-19 DIAGNOSIS — Z79899 Other long term (current) drug therapy: Secondary | ICD-10-CM | POA: Insufficient documentation

## 2023-03-19 DIAGNOSIS — D72829 Elevated white blood cell count, unspecified: Secondary | ICD-10-CM

## 2023-03-19 DIAGNOSIS — I509 Heart failure, unspecified: Secondary | ICD-10-CM | POA: Insufficient documentation

## 2023-03-19 DIAGNOSIS — I110 Hypertensive heart disease with heart failure: Secondary | ICD-10-CM | POA: Insufficient documentation

## 2023-03-19 LAB — CBC WITH DIFFERENTIAL/PLATELET
Abs Immature Granulocytes: 0.03 10*3/uL (ref 0.00–0.07)
Basophils Absolute: 0 10*3/uL (ref 0.0–0.1)
Basophils Relative: 0 %
Eosinophils Absolute: 0 10*3/uL (ref 0.0–0.5)
Eosinophils Relative: 0 %
HCT: 32.1 % — ABNORMAL LOW (ref 36.0–46.0)
Hemoglobin: 10.8 g/dL — ABNORMAL LOW (ref 12.0–15.0)
Immature Granulocytes: 0 %
Lymphocytes Relative: 12 %
Lymphs Abs: 0.9 10*3/uL (ref 0.7–4.0)
MCH: 30.3 pg (ref 26.0–34.0)
MCHC: 33.6 g/dL (ref 30.0–36.0)
MCV: 90.2 fL (ref 80.0–100.0)
Monocytes Absolute: 0.7 10*3/uL (ref 0.1–1.0)
Monocytes Relative: 9 %
Neutro Abs: 6.1 10*3/uL (ref 1.7–7.7)
Neutrophils Relative %: 79 %
Platelets: 298 10*3/uL (ref 150–400)
RBC: 3.56 MIL/uL — ABNORMAL LOW (ref 3.87–5.11)
RDW: 13.5 % (ref 11.5–15.5)
WBC: 7.8 10*3/uL (ref 4.0–10.5)
nRBC: 0 % (ref 0.0–0.2)

## 2023-03-19 LAB — URINALYSIS, ROUTINE W REFLEX MICROSCOPIC
Bilirubin Urine: NEGATIVE
Glucose, UA: NEGATIVE mg/dL
Hgb urine dipstick: NEGATIVE
Ketones, ur: NEGATIVE mg/dL
Nitrite: NEGATIVE
Protein, ur: NEGATIVE mg/dL
Specific Gravity, Urine: <1.005 — ABNORMAL LOW (ref 1.005–1.030)
WBC, UA: >50 WBC/hpf (ref 0–5)
pH: 7 (ref 5.0–8.0)

## 2023-03-19 LAB — BASIC METABOLIC PANEL
Anion gap: 7 (ref 5–15)
BUN: 11 mg/dL (ref 8–23)
CO2: 28 mmol/L (ref 22–32)
Calcium: 9 mg/dL (ref 8.9–10.3)
Chloride: 93 mmol/L — ABNORMAL LOW (ref 98–111)
Creatinine, Ser: 0.85 mg/dL (ref 0.44–1.00)
GFR, Estimated: >60 mL/min (ref >60)
Glucose, Bld: 95 mg/dL (ref 70–99)
Potassium: 4.4 mmol/L (ref 3.5–5.1)
Sodium: 128 mmol/L — ABNORMAL LOW (ref 135–145)

## 2023-03-19 LAB — HEPATIC FUNCTION PANEL
ALT: 12 U/L (ref 0–44)
AST: 13 U/L — ABNORMAL LOW (ref 15–41)
Albumin: 4.1 g/dL (ref 3.5–5.0)
Alkaline Phosphatase: 72 U/L (ref 38–126)
Bilirubin, Direct: 0.1 mg/dL (ref 0.0–0.2)
Indirect Bilirubin: 0.4 mg/dL (ref 0.3–0.9)
Total Bilirubin: 0.5 mg/dL (ref 0.3–1.2)
Total Protein: 7.1 g/dL (ref 6.5–8.1)

## 2023-03-19 LAB — URINE CULTURE
MICRO NUMBER:: 15135764
SPECIMEN QUALITY:: ADEQUATE

## 2023-03-19 LAB — MAGNESIUM: Magnesium: 1.5 mg/dL — ABNORMAL LOW (ref 1.7–2.4)

## 2023-03-19 MED ORDER — CEPHALEXIN 500 MG PO CAPS: 500.0000 mg | ORAL_CAPSULE | Freq: Three times a day (TID) | ORAL | 0 refills | Status: AC

## 2023-03-19 MED ORDER — MAGNESIUM SULFATE 50 % IJ SOLN: 1.0000 g | Freq: Once | INTRAMUSCULAR | Status: DC

## 2023-03-19 MED ORDER — MAGNESIUM SULFATE 2 GM/50ML IV SOLN
2.0000 g | Freq: Once | INTRAVENOUS | Status: AC
  Administered 2023-03-19: 2 g via INTRAVENOUS
  Filled 2023-03-19: qty 50

## 2023-03-19 MED ORDER — SODIUM CHLORIDE 0.9 % IV BOLUS
1000.0000 mL | Freq: Once | INTRAVENOUS | Status: AC
  Administered 2023-03-19: 1000 mL via INTRAVENOUS

## 2023-03-19 NOTE — Progress Notes (Unsigned)
Called patient to check status. She did see my message on results and was seen in ER with repeat labs, stable sodium. Started on Keflex for possible UTI and IVF. Plan for repeat labs in next few days. Advised of lab only orders and options at [facility] or scheduled at office. Plans to call PCP office as well. ER precautions given with understanding expressed.

## 2023-03-19 NOTE — Discharge Instructions (Addendum)
Have sodium rechecked on Monday or Tuesday.  Please return if symptoms worsen.  Please hold Lasix at this time.  My hope is that IV fluids will help.  I would continue normal hydration at home.  Start antibiotics for UTI.

## 2023-03-19 NOTE — ED Triage Notes (Signed)
Pt has hx of low sodium, pt started having nausea and tiredness, saw MD on Thursday, NA 128, here for evaluation.

## 2023-03-19 NOTE — Progress Notes (Signed)
See labs 

## 2023-03-19 NOTE — ED Provider Notes (Addendum)
[facility]ERGENCY DEPARTMENT AT [facility] Provider Note   CSN: [id number] Arrival date & time: 03/19/23  1049     History  Chief Complaint  Patient presents with   Abnormal Lab   Fatigue    Julie Lutz is a 82 y.o. female.  Patient here with generalized fatigue, supposedly low sodium.  Seen primary care doctor recently and sounds like sodium level was low.  She has a history of electrolyte abnormalities in the past.  History of hypertension, high cholesterol, heart failure, acid reflux.  She denies any chest pain or shortness of breath.  No abdominal pain.  Maybe some nausea and fatigue. No cough or pain with urination.  The history is provided by the patient.       Home Medications Prior to Admission medications   Medication Sig Start Date End Date Taking? Authorizing Provider  cephALEXin (KEFLEX) 500 MG capsule Take 1 capsule (500 mg total) by mouth 3 (three) times daily for 5 days. 03/19/23 03/24/23 Yes [name], DO  albuterol (PROVENTIL HFA) 108 (90 Base) MCG/ACT inhaler Inhale 1-2 puffs into the lungs every 6 (six) hours as needed for wheezing or shortness of breath.    [provider]  albuterol (PROVENTIL) (2.5 MG/3ML) 0.083% nebulizer solution Take 3 mLs (2.5 mg total) by nebulization every 6 (six) hours as needed for wheezing or shortness of breath. 07/30/22   [name], MD  ALPRAZolam Prudy Feeler) 0.5 MG tablet Take 1 tablet (0.5 mg total) by mouth at bedtime as needed for anxiety. 02/01/23   [name], MD  amLODipine (NORVASC) 2.5 MG tablet Take 1 tablet (2.5 mg total) by mouth daily. 02/03/23   [name], MD  atorvastatin (LIPITOR) 10 MG tablet Take 1 tablet (10 mg total) by mouth daily. 02/03/23   [name], MD  azelastine (ASTELIN) 0.1 % nasal spray Place 2 sprays into both nostrils 2 (two) times daily. 11/23/22   [name], MD  Benralizumab [facility] PEN) 30 MG/ML SOAJ Inject 1 mL (30 mg total) into the skin  every 8 (eight) weeks. 02/10/23   [name], MD  carvedilol (COREG) 25 MG tablet carvedilol 25 mg tablet  ONE BY MOUTH TWICE DAILY    [provider]  Coenzyme Q10 (COQ-10 PO) Take by mouth.    [provider]  Cyanocobalamin (VITAMIN B 12 PO) Take by mouth.    [provider]  fluticasone-salmeterol (ADVAIR) 250-50 MCG/ACT AEPB Inhale 1 puff into the lungs in the morning and at bedtime. 08/19/22   [name], MD  furosemide (LASIX) 20 MG tablet Take 1 tablet (20 mg total) by mouth daily. 02/07/23   [name], MD  MAGNESIUM CITRATE PO Take 1 tablet by mouth daily.    [provider]  methocarbamol (ROBAXIN) 500 MG tablet Take 1-2 tablets (500-1,000 mg total) by mouth every 8 (eight) hours as needed for muscle spasms (May cause drowsiness.). 03/03/23   [name], NP  mirabegron ER (MYRBETRIQ) 50 MG TB24 tablet Take 1 tablet (50 mg total) by mouth daily. 05/25/22   [name], MD  mirtazapine (REMERON) 7.5 MG tablet Take 7.5 mg by mouth at bedtime. 09/02/22   [provider]  predniSONE (DELTASONE) 10 MG tablet Take in the morning.  5 tab day 1, 4 tab day 2-3, 3 tab day 4, 2 tab day 5 03/03/23   [name], [name], NP  PREVACID 30 MG capsule Take 1 capsule (30 mg total)  by mouth daily at 12 noon. 10/26/22   [name], MD  Tiotropium Bromide Monohydrate (SPIRIVA RESPIMAT) 1.25 MCG/ACT AERS one time daily. . 10/26/19   [provider]  Vitamin D, Ergocalciferol, 50 MCG (2000 UT) CAPS Take by mouth.    [provider]      Allergies    Patient has no known allergies.    Review of Systems   Review of Systems  Physical Exam Updated Vital Signs BP 114/63   Pulse 66   Temp 98 F (36.7 C) (Oral)   Resp 13   SpO2 97%  Physical Exam Vitals and nursing note reviewed.  Constitutional:      General: She is not in acute distress.    Appearance: She is well-developed. She is not ill-appearing.  HENT:      Head: Normocephalic and atraumatic.     Nose: Nose normal.     Mouth/Throat:     Mouth: Mucous membranes are moist.  Eyes:     Extraocular Movements: Extraocular movements intact.     Conjunctiva/sclera: Conjunctivae normal.     Pupils: Pupils are equal, round, and reactive to light.  Cardiovascular:     Rate and Rhythm: Normal rate and regular rhythm.     Pulses: Normal pulses.     Heart sounds: No murmur heard. Pulmonary:     Effort: Pulmonary effort is normal. No respiratory distress.     Breath sounds: Normal breath sounds.  Abdominal:     Palpations: Abdomen is soft.     Tenderness: There is no abdominal tenderness.  Musculoskeletal:        General: No swelling. Normal range of motion.     Cervical back: Normal range of motion and neck supple.  Skin:    General: Skin is warm and dry.     Capillary Refill: Capillary refill takes less than 2 seconds.  Neurological:     General: No focal deficit present.     Mental Status: She is alert and oriented to person, place, and time.     Cranial Nerves: No cranial nerve deficit.     Sensory: No sensory deficit.     Motor: No weakness.     Coordination: Coordination normal.  Psychiatric:        Mood and Affect: Mood normal.     ED Results / Procedures / Treatments   Labs (all labs ordered are listed, but only abnormal results are displayed) Labs Reviewed  CBC WITH DIFFERENTIAL/PLATELET - Abnormal; Notable for the following components:      Result Value   RBC 3.56 (*)    Hemoglobin 10.8 (*)    HCT 32.1 (*)    All other components within normal limits  BASIC METABOLIC PANEL - Abnormal; Notable for the following components:   Sodium 128 (*)    Chloride 93 (*)    All other components within normal limits  MAGNESIUM - Abnormal; Notable for the following components:   Magnesium 1.5 (*)    All other components within normal limits  URINALYSIS, ROUTINE W REFLEX MICROSCOPIC - Abnormal; Notable for the following components:    Color, Urine COLORLESS (*)    Specific Gravity, Urine <1.005 (*)    Leukocytes,Ua MODERATE (*)    Bacteria, UA MANY (*)    All other components within normal limits  HEPATIC FUNCTION PANEL - Abnormal; Notable for the following components:   AST 13 (*)    All other components within normal limits  URINE CULTURE  EKG EKG Interpretation Date/Time:  Saturday March 19 2023 11:06:14 EDT Ventricular Rate:  70 PR Interval:  248 QRS Duration:  83 QT Interval:  374 QTC Calculation: 404 R Axis:   12  Text Interpretation: Sinus rhythm Prolonged PR interval Confirmed by Virgina Norfolk [phone]) on 03/19/2023 11:18:17 AM  Radiology DG Chest Portable 1 View  Result Date: 03/19/2023 CLINICAL DATA:  Fatigue. Nausea and tiredness. History of low sodium. EXAM: PORTABLE CHEST 1 VIEW COMPARISON:  None Available. FINDINGS: Of note, the patient is mildly rotated on this examination. Small patchy airspace opacity at the medial left lung base with additional small streaky airspace opacities in the lateral left lung base. Right lung is clear. No pleural effusion or pneumothorax. Heart is normal in size. Aortic calcifications. No acute osseous abnormality. IMPRESSION: Small patchy airspace opacity at the medial left lung base with additional small streaky airspace opacities in the lateral left lung base. Findings may reflect atelectasis, aspiration changes, or pneumonia in the appropriate clinical context. Electronically Signed   By: Sherron Ales M.D.   On: 03/19/2023 11:54    Procedures Procedures    Medications Ordered in ED Medications  magnesium sulfate IVPB 2 g 50 mL (0 g Intravenous Stopped 03/19/23 1307)  sodium chloride 0.9 % bolus 1,000 mL (0 mLs Intravenous Stopped 03/19/23 1314)    ED Course/ Medical Decision Making/ A&P                             Medical Decision Making Amount and/or Complexity of Data Reviewed Labs: ordered. Radiology: ordered.  Risk Prescription drug  management.   Nixie Kearse patient here with fatigue and abnormal labs.  I cannot see a sodium level that was drawn yesterday.  She has unremarkable vitals here.  No fever.  Will recheck basic labs including EKG.  Differential diagnosis could be symptomatic hyponatremia versus less likely infectious process. I have no concern for PE or ACS or stroke.  Sodium remains 128.  Magnesium is 1.5.  Lab work otherwise unremarkable.  No significant leukocytosis or anemia or AKI.  Chest x-ray with no evidence of pneumonia or pneumothorax per my review interpretation.  No signs of volume overload.  Clinically I do not think she has pneumonia.  Sodium levels overall equivocal.  I will have her hold Lasix.  She is given IV fluid bolus and IV magnesium.  At this time she is very well-appearing.  EKG shows sinus rhythm.  No ischemic changes.  Overall I think she can hold Lasix and follow-up with her primary care doctor for reevaluation of sodium on Monday.  Also suspect may be UTI which could be causing some of her generalized weakness.  But have no concern for sepsis.  Will start her on antibiotics as well.  Discharged in good condition.  This chart was dictated using voice recognition software.  Despite best efforts to proofread,  errors can occur which can change the documentation meaning.         Final Clinical Impression(s) / ED Diagnoses Final diagnoses:  Hyponatremia  Acute cystitis without hematuria    Rx / DC Orders ED Discharge Orders          Ordered    cephALEXin (KEFLEX) 500 MG capsule  3 times daily        03/19/23 1338              Virgina Norfolk, DO 03/19/23 1222    Virgina Norfolk,  DO 03/19/23 1338

## 2023-03-21 ENCOUNTER — Telehealth: Payer: Self-pay | Admitting: Family Medicine

## 2023-03-21 ENCOUNTER — Other Ambulatory Visit: Payer: Self-pay | Admitting: Family Medicine

## 2023-03-21 LAB — URINE CULTURE: Culture: >=100000 — AB

## 2023-03-21 NOTE — Telephone Encounter (Signed)
Pt states she needs magnesium to be added to lab orders

## 2023-03-22 ENCOUNTER — Telehealth: Payer: Self-pay

## 2023-03-22 ENCOUNTER — Other Ambulatory Visit (INDEPENDENT_AMBULATORY_CARE_PROVIDER_SITE_OTHER): Payer: Medicare Other

## 2023-03-22 DIAGNOSIS — D72829 Elevated white blood cell count, unspecified: Secondary | ICD-10-CM

## 2023-03-22 DIAGNOSIS — E871 Hypo-osmolality and hyponatremia: Secondary | ICD-10-CM | POA: Diagnosis not present

## 2023-03-22 LAB — BASIC METABOLIC PANEL
BUN: 13 mg/dL (ref 6–23)
CO2: 26 mEq/L (ref 19–32)
Calcium: 9.6 mg/dL (ref 8.4–10.5)
Chloride: 94 mEq/L — ABNORMAL LOW (ref 96–112)
Creatinine, Ser: 0.92 mg/dL (ref 0.40–1.20)
GFR: 57.19 mL/min — ABNORMAL LOW (ref >60.00)
Glucose, Bld: 83 mg/dL (ref 70–99)
Potassium: 4.9 mEq/L (ref 3.5–5.1)
Sodium: 129 mEq/L — ABNORMAL LOW (ref 135–145)

## 2023-03-22 LAB — CBC
HCT: 32.9 % — ABNORMAL LOW (ref 36.0–46.0)
Hemoglobin: 10.8 g/dL — ABNORMAL LOW (ref 12.0–15.0)
MCHC: 32.7 g/dL (ref 30.0–36.0)
MCV: 91.7 fl (ref 78.0–100.0)
Platelets: 372 10*3/uL (ref 150.0–400.0)
RBC: 3.59 Mil/uL — ABNORMAL LOW (ref 3.87–5.11)
RDW: 14.1 % (ref 11.5–15.5)
WBC: 7.8 10*3/uL (ref 4.0–10.5)

## 2023-03-22 NOTE — Telephone Encounter (Signed)
Post ED Visit - Positive Culture Follow-up  Culture report reviewed by antimicrobial stewardship pharmacist: [facility] []  Enzo Bi, Pharm.D. []  Celedonio Miyamoto, Pharm.D., BCPS AQ-ID []  Garvin Fila, Pharm.D., BCPS []  Georgina Pillion, [address].D., BCPS []  [place], [place].D., BCPS, AAHIVP []  Estella Husk, Pharm.D., BCPS, AAHIVP []  Lysle Pearl, PharmD, BCPS []  Phillips Climes, PharmD, BCPS []  Agapito Games, PharmD, BCPS []  Verlan Friends, PharmD []  Mervyn Gay, PharmD, BCPS []  Vinnie Level, PharmD  [facility] []  Len Childs, PharmD []  Greer Pickerel, PharmD []  Adalberto Cole, PharmD []  Perlie Gold, Rph []  Lonell Face) Jean Rosenthal, PharmD []  Earl Many, PharmD []  Junita Push, PharmD []  Dorna Leitz, PharmD []  Terrilee Files, PharmD []  Lynann Beaver, PharmD []  Keturah Barre, PharmD []  Loralee Pacas, PharmD []  Bernadene Person, PharmD   Positive Urine culture Treated with Cephalexin, organism sensitive to the same and no further patient follow-up is required at this time per Arthor Captain PAC.  Bing Quarry 03/22/2023, 8:53 AM

## 2023-03-22 NOTE — Telephone Encounter (Signed)
-----   Message from Alveria Apley, NP sent at 03/22/2023  1:33 PM EDT ----- My name is Zandra Abts, NP and I am covering for Dr. Neva Seat while he is out of office for the week. Based your chart, you were seen by the emergency department on 06/29 from seeing Dr. Neva Seat on 06/27. Your urine culture that was completed during the 06/27 visit, came back with E.coli for an urinary tract infection. You were prescribed Keflex that will cover the urinary tract infection based on this culture. If you are still having symptoms after completing this antibiotic, please follow up with primary care provider.

## 2023-03-23 ENCOUNTER — Other Ambulatory Visit: Payer: Self-pay

## 2023-03-23 ENCOUNTER — Telehealth: Payer: Self-pay

## 2023-03-23 DIAGNOSIS — E871 Hypo-osmolality and hyponatremia: Secondary | ICD-10-CM

## 2023-03-23 DIAGNOSIS — R7989 Other specified abnormal findings of blood chemistry: Secondary | ICD-10-CM

## 2023-03-23 NOTE — Telephone Encounter (Signed)
Informed pt of lab results she is agreeable to the endocrinology referral and that has been placed

## 2023-03-23 NOTE — Telephone Encounter (Signed)
-----   Message from Alveria Apley, NP sent at 03/22/2023  4:34 PM EDT ----- CBC is stable, about the same as it has been. Sodium and chloride is still low. How are you feeling? Recommend an endocrinology referral with having continuous low levels of sodium and chloride. I am sending a copy of this message to your PCP and Dr. Neva Seat.

## 2023-03-23 NOTE — Telephone Encounter (Signed)
I informed pt of the lab results and she was agreeable to see Endodontology . She has called back and now she would rather see a Nephrologist is ok to place a referral ?

## 2023-03-23 NOTE — Telephone Encounter (Signed)
Nephrology referral has been placed and pt has been advised that she needs to keep the Endodontologist referral as well .

## 2023-03-28 ENCOUNTER — Telehealth: Payer: Self-pay

## 2023-03-28 NOTE — Telephone Encounter (Signed)
Pt  called and states that [facility] states they did not get our referral . It was sent . Do we resend it to them ?

## 2023-03-28 NOTE — Telephone Encounter (Signed)
Referral was faxed again  

## 2023-03-28 NOTE — Telephone Encounter (Signed)
I advised the pt that the referral has been re faxed the referral

## 2023-03-30 ENCOUNTER — Ambulatory Visit (HOSPITAL_BASED_OUTPATIENT_CLINIC_OR_DEPARTMENT_OTHER)
Admission: RE | Admit: 2023-03-30 | Discharge: 2023-03-30 | Disposition: A | Discharge status: Home / Self Care | Payer: Medicare Other | Source: Ambulatory Visit | Attending: Family Medicine | Admitting: Family Medicine

## 2023-03-30 ENCOUNTER — Encounter: Payer: Self-pay | Admitting: Family Medicine

## 2023-03-30 ENCOUNTER — Ambulatory Visit (INDEPENDENT_AMBULATORY_CARE_PROVIDER_SITE_OTHER): Payer: Medicare Other | Admitting: Family Medicine

## 2023-03-30 VITALS — BP 124/70 | HR 72 | Temp 97.5°F | Resp 16 | Ht <= 58 in | Wt 142.0 lb

## 2023-03-30 DIAGNOSIS — E871 Hypo-osmolality and hyponatremia: Secondary | ICD-10-CM | POA: Diagnosis not present

## 2023-03-30 DIAGNOSIS — D649 Anemia, unspecified: Secondary | ICD-10-CM

## 2023-03-30 DIAGNOSIS — R5383 Other fatigue: Secondary | ICD-10-CM | POA: Diagnosis not present

## 2023-03-30 DIAGNOSIS — M5416 Radiculopathy, lumbar region: Secondary | ICD-10-CM | POA: Insufficient documentation

## 2023-03-30 LAB — COMPREHENSIVE METABOLIC PANEL
ALT: 11 U/L (ref 0–35)
AST: 12 U/L (ref 0–37)
Albumin: 4.2 g/dL (ref 3.5–5.2)
Alkaline Phosphatase: 72 U/L (ref 39–117)
BUN: 15 mg/dL (ref 6–23)
CO2: 28 mEq/L (ref 19–32)
Calcium: 9.6 mg/dL (ref 8.4–10.5)
Chloride: 89 mEq/L — ABNORMAL LOW (ref 96–112)
Creatinine, Ser: 1.05 mg/dL (ref 0.40–1.20)
GFR: 48.79 mL/min — ABNORMAL LOW (ref >60.00)
Glucose, Bld: 105 mg/dL — ABNORMAL HIGH (ref 70–99)
Potassium: 5 mEq/L (ref 3.5–5.1)
Sodium: 127 mEq/L — ABNORMAL LOW (ref 135–145)
Total Bilirubin: 0.5 mg/dL (ref 0.2–1.2)
Total Protein: 6.7 g/dL (ref 6.0–8.3)

## 2023-03-30 LAB — CBC WITH DIFFERENTIAL/PLATELET
Basophils Absolute: 0 10*3/uL (ref 0.0–0.1)
Basophils Relative: 0.2 % (ref 0.0–3.0)
Eosinophils Absolute: 0 10*3/uL (ref 0.0–0.7)
Eosinophils Relative: 0 % (ref 0.0–5.0)
HCT: 32.2 % — ABNORMAL LOW (ref 36.0–46.0)
Hemoglobin: 10.6 g/dL — ABNORMAL LOW (ref 12.0–15.0)
Lymphocytes Relative: 14.3 % (ref 12.0–46.0)
Lymphs Abs: 1.2 10*3/uL (ref 0.7–4.0)
MCHC: 32.9 g/dL (ref 30.0–36.0)
MCV: 90.8 fl (ref 78.0–100.0)
Monocytes Absolute: 0.7 10*3/uL (ref 0.1–1.0)
Monocytes Relative: 8.6 % (ref 3.0–12.0)
Neutro Abs: 6.3 10*3/uL (ref 1.4–7.7)
Neutrophils Relative %: 76.9 % (ref 43.0–77.0)
Platelets: 423 10*3/uL — ABNORMAL HIGH (ref 150.0–400.0)
RBC: 3.55 Mil/uL — ABNORMAL LOW (ref 3.87–5.11)
RDW: 14.1 % (ref 11.5–15.5)
WBC: 8.2 10*3/uL (ref 4.0–10.5)

## 2023-03-30 LAB — SEDIMENTATION RATE: Sed Rate: 23 mm/hr (ref 0–30)

## 2023-03-30 LAB — VITAMIN B12: Vitamin B-12: 1144 pg/mL — ABNORMAL HIGH (ref 211–911)

## 2023-03-30 LAB — MAGNESIUM: Magnesium: 1.8 mg/dL (ref 1.5–2.5)

## 2023-03-30 LAB — TSH: TSH: 2.63 u[IU]/mL (ref 0.35–5.50)

## 2023-03-30 MED ORDER — MIRTAZAPINE 7.5 MG PO TABS: 7.5000 mg | ORAL_TABLET | Freq: Every day | ORAL | 3 refills | Status: DC

## 2023-03-30 MED ORDER — AMLODIPINE BESYLATE 2.5 MG PO TABS: 2.5000 mg | ORAL_TABLET | Freq: Every day | ORAL | 3 refills | Status: DC

## 2023-03-30 NOTE — Patient Instructions (Signed)
Elevate legs.   Take lasix 20mg  every other day.

## 2023-03-30 NOTE — Progress Notes (Signed)
Subjective:     Patient ID: Julie Lutz, female    DOB: December 01, 1940, 82 y.o.   MRN: [id number]  Chief Complaint  Patient presents with   Follow-up    ED follow-up on 03/19/23 for low sodium fatigue     HPI She is accompanied by her daughter.  ED f/u (fatigue/low sodium) - She presented to the ED with generalized fatigue with low sodium levels on 6/29. 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 Chest x-ray and EKG was normal. Sodium was 128 and Magnesium was 1.5. Today, reports she was given a liter of sodium and IV magnesium while in the ED. She states she also had a UTI at the time. She was advised to hold Lasix.   Leg swelling - She reports new bilateral LE edema about 1 week ago. She states legs swell even when sitting down. She notes she stopped taking Lasix the first week of July while visiting family., afterwards she noticed more swelling. She states she is on Lasix 20 mg for her blood pressure, not for edema or CHF. She previously was on 40 mg but decreased dose to 20 mg many years ago. She tends to not take Lasix on days she will be on her feet for extended periods of time. States she was previously on lopressor at one point.   HTN - She is on Carvedilol 25 BID, Amlodipine 2.5 mg daily. No ha/dizziness/cp/palp/cough  Right hip pain - She complains of right hip pain. She believes her hip pain could possibly be contributing to her fatigue.  Also, chronic LBP.Marland Kitchen   Shortness of breath- She endorses new SOB. Her daughter notes even walking into the clinic today made her short of breath. She reports this is unusual for her.  Low Magnesium levels - Taking about 4 slow mag about 300 mg and recently started CoQ10   Nephrology -  She states she has not been able to hear back from [facility] about her referral. She left a voicemail but has not been able to schedule an appt yet.   Health Maintenance Due  Topic Date Due   DTaP/Tdap/Td (1 - Tdap) Never done    Past  Medical History:  Diagnosis Date   Allergy    Arthritis    Asthma    esosinophillic   Cancer (HCC)    vocal cord   Cataract    GERD (gastroesophageal reflux disease)    Hyperlipidemia    Hypertension    Hyponatremia    Osteoporosis    Spinal stenosis     Past Surgical History:  Procedure Laterality Date   FEMUR FRACTURE SURGERY Right 08/2016   HERNIA REPAIR Right    inguinal   JOINT REPLACEMENT  2017   bilateral knee   LAMINECTOMY  11/2020   L3-5   LARYNGOSCOPY  11/2021   SPINAL FUSION  03/2021     Current Outpatient Medications:    albuterol (PROVENTIL HFA) 108 (90 Base) MCG/ACT inhaler, Inhale 1-2 puffs into the lungs every 6 (six) hours as needed for wheezing or shortness of breath., Disp: , Rfl:    albuterol (PROVENTIL) (2.5 MG/3ML) 0.083% nebulizer solution, Take 3 mLs (2.5 mg total) by nebulization every 6 (six) hours as needed for wheezing or shortness of breath., Disp: 75 mL, Rfl: 12   ALPRAZolam (XANAX) 0.5 MG tablet, Take 1 tablet (0.5 mg total) by mouth at bedtime as needed for anxiety., Disp: 90 tablet, Rfl: 1   atorvastatin (LIPITOR) 10 MG tablet, Take  1 tablet (10 mg total) by mouth daily., Disp: 90 tablet, Rfl: 3   azelastine (ASTELIN) 0.1 % nasal spray, Place 2 sprays into both nostrils 2 (two) times daily., Disp: 30 mL, Rfl: 12   Benralizumab (FASENRA PEN) 30 MG/ML SOAJ, Inject 1 mL (30 mg total) into the skin every 8 (eight) weeks., Disp: 1 mL, Rfl: 3   carvedilol (COREG) 25 MG tablet, carvedilol 25 mg tablet  ONE BY MOUTH TWICE DAILY, Disp: , Rfl:    Coenzyme Q10 (COQ-10 PO), Take by mouth., Disp: , Rfl:    Cyanocobalamin (VITAMIN B 12 PO), Take by mouth., Disp: , Rfl:    fluticasone-salmeterol (ADVAIR) 250-50 MCG/ACT AEPB, Inhale 1 puff into the lungs in the morning and at bedtime., Disp: 180 each, Rfl: 3   furosemide (LASIX) 20 MG tablet, Take 1 tablet (20 mg total) by mouth daily., Disp: 90 tablet, Rfl: 3   MAGNESIUM CITRATE PO, Take 1 tablet by mouth  daily., Disp: , Rfl:    methocarbamol (ROBAXIN) 500 MG tablet, Take 1-2 tablets (500-1,000 mg total) by mouth every 8 (eight) hours as needed for muscle spasms (May cause drowsiness.)., Disp: 30 tablet, Rfl: 0   mirabegron ER (MYRBETRIQ) 50 MG TB24 tablet, Take 1 tablet (50 mg total) by mouth daily., Disp: 90 tablet, Rfl: 3   PREVACID 30 MG capsule, Take 1 capsule (30 mg total) by mouth daily at 12 noon., Disp: 90 capsule, Rfl: 1   Vitamin D, Ergocalciferol, 50 MCG (2000 UT) CAPS, Take by mouth., Disp: , Rfl:    amLODipine (NORVASC) 2.5 MG tablet, Take 1 tablet (2.5 mg total) by mouth daily., Disp: 90 tablet, Rfl: 3   mirtazapine (REMERON) 7.5 MG tablet, Take 1 tablet (7.5 mg total) by mouth at bedtime., Disp: 90 tablet, Rfl: 3   Tiotropium Bromide Monohydrate (SPIRIVA RESPIMAT) 1.25 MCG/ACT AERS, one time daily. . (Patient not taking: Reported on 03/30/2023), Disp: , Rfl:   No Known Allergies ROS neg/noncontributory except as noted HPI/below      Objective:     BP 124/70   Pulse 72   Temp (!) 97.5 F (36.4 C) (Temporal)   Resp 16   Ht 4\' 9"  (1.448 m)   Wt 142 lb (64.4 kg)   SpO2 97%   BMI 30.73 kg/m  Wt Readings from Last 3 Encounters:  03/30/23 142 lb (64.4 kg)  03/17/23 143 lb 3.2 oz (65 kg)  03/03/23 144 lb 6.4 oz (65.5 kg)    Physical Exam   Gen: WDWN NAD NECK:  supple but chronic firmness ant neck, no thyromegaly, no nodes, no carotid bruits CARDIAC: RRR, S1S2+, no murmur. DP 2+B LUNGS: CTAB. No wheezes EXT:  + tr edema feet and ankles.  Some varicose veins MSK: no gross abnormalities.  NEURO: A&O x3.  CN II-XII intact.  PSYCH: normal mood. Good eye contact  Reviewed ER records. Reviewed and updated meds.    Assessment & Plan:  Low sodium levels -     Comprehensive metabolic panel  Hypomagnesemia -     Comprehensive metabolic panel -     Magnesium  Fatigue, unspecified type -     CBC with Differential/Platelet -     Comprehensive metabolic panel -      TSH -     Sedimentation rate  Anemia, unspecified type -     CBC with Differential/Platelet -     Iron, TIBC and Ferritin Panel -     Sedimentation rate -  Vitamin B12  Lumbar radiculopathy -     DG HIP UNILAT W OR W/O PELVIS 2-3 VIEWS RIGHT; Future -     DG Lumbar Spine Complete; Future  Other fatigue  Other orders -     Mirtazapine; Take 1 tablet (7.5 mg total) by mouth at bedtime.  Dispense: 90 tablet; Refill: 3 -     amLODIPine Besylate; Take 1 tablet (2.5 mg total) by mouth daily.  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   Will check labs.  Already not drinking much and on lasix.  Awaiting appt for renal/endo.   Check labs hypoMg-? From SIADH, diuretics, PPI, other.  Hard to swallow some pills so MgOx not an option.  Try lasix every other day. New trace edema-but off lasix for 1 wk.  Back on now-elevate legs. Anemia-chronic.  Had w/u in past-iron infusions.  Has seen GI and heme.  Will check studies Lumbar radiculopathy and R hip pain-worsening.  Check x-rays Fatigue-prob multifactorial-chronic problems Hosp f/u-reviewed med list and ER records-spent records, review, plan, research UTD.  Return in about 4 weeks (around 04/27/2023) for chronic follow-up.   I,Rachel Rivera,acting as a scribe for Angelena Sole, MD.,have documented all relevant documentation on the behalf of Angelena Sole, MD,as directed by  Angelena Sole, MD while in the presence of Angelena Sole, MD.  I, Angelena Sole, MD, have reviewed all documentation for this visit. The documentation on 03/30/23 for the exam, diagnosis, procedures, and orders are all accurate and complete.   Angelena Sole, MD

## 2023-03-31 LAB — IRON,TIBC AND FERRITIN PANEL
%SAT: 12 % (calc) — ABNORMAL LOW (ref 16–45)
Ferritin: 86 ng/mL (ref 16–288)
Iron: 41 ug/dL — ABNORMAL LOW (ref 45–160)
TIBC: 339 mcg/dL (calc) (ref 250–450)

## 2023-03-31 MED ORDER — SODIUM CHLORIDE 1 G PO TABS
1.0000 g | ORAL_TABLET | Freq: Two times a day (BID) | ORAL | 1 refills | Status: DC
Start: 2023-03-31 — End: 2023-04-01

## 2023-03-31 NOTE — Addendum Note (Signed)
Addended by: Angelena Sole on: 03/31/2023 03:09 PM   Modules accepted: Orders

## 2023-03-31 NOTE — Progress Notes (Signed)
1.  Sodium low-try to add protein to diet.  Sending in salt pills-twice daily.  Recheck labs when back to town 2.  Iron levels low-take iron every other day and may need stool softeners if getting constipated X-ray reports not back yet-that may take several days.

## 2023-04-01 ENCOUNTER — Telehealth: Payer: Self-pay | Admitting: Family Medicine

## 2023-04-01 ENCOUNTER — Other Ambulatory Visit: Payer: Self-pay | Admitting: Family Medicine

## 2023-04-01 MED ORDER — SODIUM CHLORIDE 1 G PO TABS: 1.0000 g | ORAL_TABLET | Freq: Two times a day (BID) | ORAL | 1 refills | Status: DC

## 2023-04-01 NOTE — Telephone Encounter (Signed)
Patient called stating that pharmacy in [place] informed her they hadn't received the sodium chloride rx order. Requests this be re-sent.

## 2023-04-01 NOTE — Telephone Encounter (Signed)
Patient called back stating that pharmacy did not receive medication and stated that the office needed to call pharmacy with orders. Spoke to pharmacist and he stated that they have been having some national outages. Order given to pharmacist and he stated he would get it ready for the patient.

## 2023-04-01 NOTE — Telephone Encounter (Signed)
Patient notified and verbalized understanding. 

## 2023-04-05 ENCOUNTER — Other Ambulatory Visit: Payer: Self-pay | Admitting: Family Medicine

## 2023-04-06 ENCOUNTER — Emergency Department (HOSPITAL_BASED_OUTPATIENT_CLINIC_OR_DEPARTMENT_OTHER)
Admission: EM | Admit: 2023-04-06 | Discharge: 2023-04-06 | Disposition: A | Discharge status: Home / Self Care | Payer: Medicare Other | Attending: Emergency Medicine | Admitting: Emergency Medicine

## 2023-04-06 ENCOUNTER — Emergency Department (HOSPITAL_BASED_OUTPATIENT_CLINIC_OR_DEPARTMENT_OTHER): Payer: Medicare Other

## 2023-04-06 ENCOUNTER — Encounter (HOSPITAL_BASED_OUTPATIENT_CLINIC_OR_DEPARTMENT_OTHER): Payer: Self-pay | Admitting: Emergency Medicine

## 2023-04-06 ENCOUNTER — Other Ambulatory Visit: Payer: Self-pay

## 2023-04-06 ENCOUNTER — Emergency Department (HOSPITAL_BASED_OUTPATIENT_CLINIC_OR_DEPARTMENT_OTHER): Payer: Medicare Other | Admitting: Radiology

## 2023-04-06 DIAGNOSIS — R110 Nausea: Secondary | ICD-10-CM | POA: Diagnosis present

## 2023-04-06 DIAGNOSIS — K5730 Diverticulosis of large intestine without perforation or abscess without bleeding: Secondary | ICD-10-CM | POA: Insufficient documentation

## 2023-04-06 DIAGNOSIS — R197 Diarrhea, unspecified: Secondary | ICD-10-CM | POA: Insufficient documentation

## 2023-04-06 DIAGNOSIS — I700 Atherosclerosis of aorta: Secondary | ICD-10-CM | POA: Diagnosis not present

## 2023-04-06 DIAGNOSIS — R531 Weakness: Secondary | ICD-10-CM | POA: Insufficient documentation

## 2023-04-06 DIAGNOSIS — U071 COVID-19: Secondary | ICD-10-CM | POA: Diagnosis not present

## 2023-04-06 LAB — COMPREHENSIVE METABOLIC PANEL
ALT: 11 U/L (ref 0–44)
AST: 15 U/L (ref 15–41)
Albumin: 4.3 g/dL (ref 3.5–5.0)
Alkaline Phosphatase: 57 U/L (ref 38–126)
Anion gap: 11 (ref 5–15)
BUN: 16 mg/dL (ref 8–23)
CO2: 28 mmol/L (ref 22–32)
Calcium: 9.5 mg/dL (ref 8.9–10.3)
Chloride: 94 mmol/L — ABNORMAL LOW (ref 98–111)
Creatinine, Ser: 0.92 mg/dL (ref 0.44–1.00)
GFR, Estimated: >60 mL/min (ref >60)
Glucose, Bld: 90 mg/dL (ref 70–99)
Potassium: 4.1 mmol/L (ref 3.5–5.1)
Sodium: 133 mmol/L — ABNORMAL LOW (ref 135–145)
Total Bilirubin: 0.5 mg/dL (ref 0.3–1.2)
Total Protein: 7.5 g/dL (ref 6.5–8.1)

## 2023-04-06 LAB — URINALYSIS, ROUTINE W REFLEX MICROSCOPIC
Bilirubin Urine: NEGATIVE
Bilirubin Urine: NEGATIVE
Glucose, UA: NEGATIVE mg/dL
Glucose, UA: NEGATIVE mg/dL
Ketones, ur: NEGATIVE mg/dL
Ketones, ur: NEGATIVE mg/dL
Nitrite: POSITIVE — AB
Nitrite: POSITIVE — AB
Protein, ur: NEGATIVE mg/dL
Protein, ur: NEGATIVE mg/dL
Specific Gravity, Urine: 1.012 (ref 1.005–1.030)
Specific Gravity, Urine: 1.007 (ref 1.005–1.030)
WBC, UA: >50 WBC/hpf (ref 0–5)
WBC, UA: >50 WBC/hpf (ref 0–5)
pH: 6 (ref 5.0–8.0)
pH: 6 (ref 5.0–8.0)

## 2023-04-06 LAB — CBC
HCT: 34.4 % — ABNORMAL LOW (ref 36.0–46.0)
Hemoglobin: 11.3 g/dL — ABNORMAL LOW (ref 12.0–15.0)
MCH: 29.9 pg (ref 26.0–34.0)
MCHC: 32.8 g/dL (ref 30.0–36.0)
MCV: 91 fL (ref 80.0–100.0)
Platelets: 412 10*3/uL — ABNORMAL HIGH (ref 150–400)
RBC: 3.78 MIL/uL — ABNORMAL LOW (ref 3.87–5.11)
RDW: 13.4 % (ref 11.5–15.5)
WBC: 10.1 10*3/uL (ref 4.0–10.5)
nRBC: 0 % (ref 0.0–0.2)

## 2023-04-06 LAB — MAGNESIUM: Magnesium: 2.2 mg/dL (ref 1.7–2.4)

## 2023-04-06 LAB — LIPASE, BLOOD: Lipase: 17 U/L (ref 11–51)

## 2023-04-06 LAB — SARS CORONAVIRUS 2 BY RT PCR: SARS Coronavirus 2 by RT PCR: POSITIVE — AB

## 2023-04-06 MED ORDER — SODIUM CHLORIDE 0.9 % IV BOLUS
1000.0000 mL | Freq: Once | INTRAVENOUS | Status: AC
  Administered 2023-04-06: 1000 mL via INTRAVENOUS

## 2023-04-06 MED ORDER — IOHEXOL 300 MG/ML  SOLN
100.0000 mL | Freq: Once | INTRAMUSCULAR | Status: AC | PRN
  Administered 2023-04-06: 75 mL via INTRAVENOUS

## 2023-04-06 MED ORDER — ONDANSETRON 4 MG PO TBDP: 4.0000 mg | ORAL_TABLET | Freq: Three times a day (TID) | ORAL | 0 refills | Status: DC | PRN

## 2023-04-06 NOTE — ED Provider Notes (Signed)
[place] EMERGENCY DEPARTMENT AT [facility] Provider Note   CSN: [id number] Arrival date & time: 04/06/23  1221     History  Chief Complaint  Patient presents with   Diarrhea    Julie Lutz is a 82 y.o. female, history of GERD, who presents to the ED secondary to increased weakness, fatigue, diarrhea, and nausea for the last week.  She notes that she is having 5-6 episodes of diarrhea on the daily basis for the last week.  She notes that she recently started salt pills, and iron pills, and she does not know if it is was causing it.  Notes that she last had antibiotics about 2 weeks ago.  Reports generalized abdominal pain, especially when she has stools.  Denies any recent illnesses.  No shortness of breath, chest pain.  Does state that she has had a lot of nausea and has not been drinking water, and did vomit 1 time yesterday.  Denies any blood in stools.  Denies any urinary symptoms.     Home Medications Prior to Admission medications   Medication Sig Start Date End Date Taking? Authorizing Provider  ondansetron (ZOFRAN-ODT) 4 MG disintegrating tablet Take 1 tablet (4 mg total) by mouth every 8 (eight) hours as needed. 04/06/23  Yes Obert Espindola L, PA  albuterol (PROVENTIL HFA) 108 (90 Base) MCG/ACT inhaler Inhale 1-2 puffs into the lungs every 6 (six) hours as needed for wheezing or shortness of breath.    [provider]  albuterol (PROVENTIL) (2.5 MG/3ML) 0.083% nebulizer solution Take 3 mLs (2.5 mg total) by nebulization every 6 (six) hours as needed for wheezing or shortness of breath. 07/30/22   [name], MD  ALPRAZolam Prudy Feeler) 0.5 MG tablet Take 1 tablet (0.5 mg total) by mouth at bedtime as needed for anxiety. 02/01/23   [name], MD  amLODipine (NORVASC) 2.5 MG tablet Take 1 tablet (2.5 mg total) by mouth daily. 03/30/23   [name], MD  atorvastatin (LIPITOR) 10 MG tablet Take 1 tablet (10 mg total) by mouth daily. 02/03/23   [name], MD  azelastine (ASTELIN) 0.1 % nasal spray Place 2 sprays into both nostrils 2 (two) times daily. 11/23/22   [name], MD  Benralizumab [facility] PEN) 30 MG/ML SOAJ Inject 1 mL (30 mg total) into the skin every 8 (eight) weeks. 02/10/23   [name], MD  carvedilol (COREG) 25 MG tablet carvedilol 25 mg tablet  ONE BY MOUTH TWICE DAILY    [provider]  Coenzyme Q10 (COQ-10 PO) Take by mouth.    [provider]  Cyanocobalamin (VITAMIN B 12 PO) Take by mouth.    [provider]  fluticasone-salmeterol (ADVAIR) 250-50 MCG/ACT AEPB Inhale 1 puff into the lungs in the morning and at bedtime. 08/19/22   [name], MD  furosemide (LASIX) 20 MG tablet Take 1 tablet (20 mg total) by mouth daily. 02/07/23   [name], MD  MAGNESIUM CITRATE PO Take 1 tablet by mouth daily.    [provider]  methocarbamol (ROBAXIN) 500 MG tablet Take 1-2 tablets (500-1,000 mg total) by mouth every 8 (eight) hours as needed for muscle spasms (May cause drowsiness.). 03/03/23   [name], NP  mirabegron ER (MYRBETRIQ) 50 MG TB24 tablet Take 1 tablet (50 mg total) by mouth daily. 05/25/22   [name], MD  mirtazapine (REMERON) 7.5 MG tablet Take 1 tablet (7.5 mg total) by mouth at bedtime.  03/30/23   [name], MD  PREVACID 30 MG capsule TAKE 1 CAPSULE DAILY AT 12 NOON 04/05/23   [name], MD  sodium chloride 1 g tablet Take 1 tablet (1 g total) by mouth 2 (two) times daily with a meal. 04/01/23   [name], MD  Tiotropium Bromide Monohydrate (SPIRIVA RESPIMAT) 1.25 MCG/ACT AERS one time daily. . Patient not taking: Reported on 03/30/2023 10/26/19   [provider]  Vitamin D, Ergocalciferol, 50 MCG (2000 UT) CAPS Take by mouth.    [provider]      Allergies    Patient has no known allergies.    Review of Systems   Review of Systems  Constitutional:  Negative for fever.  Gastrointestinal:   Positive for abdominal pain, diarrhea, nausea and vomiting.    Physical Exam Updated Vital Signs BP (!) 142/56   Pulse 85   Temp 98.5 F (36.9 C) (Oral)   Resp (!) 21   Ht 4\' 9"  (1.448 m)   Wt 64.4 kg   SpO2 97%   BMI 30.73 kg/m  Physical Exam Vitals and nursing note reviewed.  Constitutional:      General: She is not in acute distress.    Appearance: She is well-developed.  HENT:     Head: Normocephalic and atraumatic.     Mouth/Throat:     Mouth: Mucous membranes are dry.  Eyes:     Conjunctiva/sclera: Conjunctivae normal.  Cardiovascular:     Rate and Rhythm: Normal rate and regular rhythm.     Heart sounds: No murmur heard. Pulmonary:     Effort: Pulmonary effort is normal. No respiratory distress.     Breath sounds: Normal breath sounds.  Abdominal:     Palpations: Abdomen is soft.     Tenderness: There is abdominal tenderness in the right lower quadrant and left lower quadrant.  Musculoskeletal:        General: No swelling.     Cervical back: Neck supple.  Skin:    General: Skin is warm and dry.     Capillary Refill: Capillary refill takes less than 2 seconds.  Neurological:     Mental Status: She is alert.  Psychiatric:        Mood and Affect: Mood normal.     ED Results / Procedures / Treatments   Labs (all labs ordered are listed, but only abnormal results are displayed) Labs Reviewed  SARS CORONAVIRUS 2 BY RT PCR - Abnormal; Notable for the following components:      Result Value   SARS Coronavirus 2 by RT PCR POSITIVE (*)    All other components within normal limits  COMPREHENSIVE METABOLIC PANEL - Abnormal; Notable for the following components:   Sodium 133 (*)    Chloride 94 (*)    All other components within normal limits  CBC - Abnormal; Notable for the following components:   RBC 3.78 (*)    Hemoglobin 11.3 (*)    HCT 34.4 (*)    Platelets 412 (*)    All other components within normal limits  URINALYSIS, ROUTINE W REFLEX MICROSCOPIC -  Abnormal; Notable for the following components:   APPearance HAZY (*)    Hgb urine dipstick TRACE (*)    Nitrite POSITIVE (*)    Leukocytes,Ua LARGE (*)    Bacteria, UA MANY (*)    All other components within normal limits  URINALYSIS, ROUTINE W REFLEX MICROSCOPIC - Abnormal; Notable for the following components:  Hgb urine dipstick TRACE (*)    Nitrite POSITIVE (*)    Leukocytes,Ua LARGE (*)    Bacteria, UA RARE (*)    All other components within normal limits  GASTROINTESTINAL PANEL BY PCR, STOOL (REPLACES STOOL CULTURE)  C DIFFICILE QUICK SCREEN W PCR REFLEX    LIPASE, BLOOD  MAGNESIUM    EKG EKG Interpretation Date/Time:  Wednesday April 06 2023 15:22:36 EDT Ventricular Rate:  82 PR Interval:  252 QRS Duration:  83 QT Interval:  374 QTC Calculation: 437 R Axis:   11  Text Interpretation: Sinus rhythm Atrial premature complex Prolonged PR interval Low voltage, precordial leads Borderline T abnormalities, anterior leads No significant change since last tracing Confirmed by Benjiman Core [id number]) on 04/06/2023 5:04:10 PM  Radiology DG Chest 2 View  Result Date: 04/06/2023 CLINICAL DATA:  Diarrhea and weakness.  To evaluate for pneumonia. EXAM: CHEST - 2 VIEW COMPARISON:  03/19/2023 FINDINGS: Heart size and pulmonary vascularity are normal. Probable emphysematous changes in the lungs. Linear scarring or atelectasis in the lung bases, greater on the left. No focal consolidation or airspace disease. No pleural effusions. No pneumothorax. Mediastinal contours appear intact. Calcified and tortuous ectatic appearance of the aorta. Degenerative changes in the spine. Postoperative changes in the lumbar spine. IMPRESSION: Emphysematous changes in the lungs with scarring or atelectasis in the lung bases, unchanged since prior study. Electronically Signed   By: Burman Nieves M.D.   On: 04/06/2023 16:21   CT ABDOMEN PELVIS W CONTRAST  Result Date: 04/06/2023 CLINICAL DATA:  Diarrhea.   Nausea, lack of appetite, fatigue. EXAM: CT ABDOMEN AND PELVIS WITH CONTRAST TECHNIQUE: Multidetector CT imaging of the abdomen and pelvis was performed using the standard protocol following bolus administration of intravenous contrast. RADIATION DOSE REDUCTION: This exam was performed according to the departmental dose-optimization program which includes automated exposure control, adjustment of the mA and/or kV according to patient size and/or use of iterative reconstruction technique. CONTRAST:  75mL OMNIPAQUE IOHEXOL 300 MG/ML  SOLN COMPARISON:  Renal ultrasound 05/04/2018 FINDINGS: Lower chest: Scarring versus linear atelectasis in the lung bases. Probable focal airspace disease in the left lower lung, incompletely included within the field of view. This could indicate pneumonia. Hepatobiliary: No focal liver abnormality is seen. No gallstones, gallbladder wall thickening, or biliary dilatation. Pancreas: Unremarkable. No pancreatic ductal dilatation or surrounding inflammatory changes. Spleen: Normal in size without focal abnormality. Adrenals/Urinary Tract: No adrenal gland nodules. Bilateral renal cysts. Largest on the left measures 2.5 cm. No imaging follow-up is indicated. No hydronephrosis or hydroureter. Bladder is decompressed. Renal calcifications likely representing vascular calcification. Stomach/Bowel: Stomach, Vivian Neuwirth bowel, and colon are not abnormally distended. No wall thickening or inflammatory changes. Colonic diverticulosis without evidence of acute diverticulitis. Appendix is normal. Vascular/Lymphatic: Aortic atherosclerosis. No enlarged abdominal or pelvic lymph nodes. Reproductive: Uterus and bilateral adnexa are unremarkable. Other: No abdominal wall hernia or abnormality. No abdominopelvic ascites. Musculoskeletal: Degenerative changes in the spine. Postoperative changes with lower lumbar laminectomies and hardware fixation. Previous internal fixation of the right proximal femur.  IMPRESSION: 1. Scarring versus linear atelectasis in the lung bases. Possible pneumonia in the left lower lung, incompletely included within the field of view. 2. No evidence of bowel obstruction or inflammation. Colonic diverticula without evidence of acute diverticulitis. 3. Aortic atherosclerosis. Electronically Signed   By: Burman Nieves M.D.   On: 04/06/2023 16:20    Procedures Procedures    Medications Ordered in ED Medications  sodium chloride 0.9 % bolus 1,000 mL (  1,000 mLs Intravenous New Bag/Given 04/06/23 1549)  iohexol (OMNIPAQUE) 300 MG/ML solution 100 mL (75 mLs Intravenous Contrast Given 04/06/23 1557)    ED Course/ Medical Decision Making/ A&P                             Medical Decision Making Patient is a 82 year old female, here for weakness, diarrhea, that is been going on for the last week.  She states she is also having some abdominal pain primarily to the lower quadrants, and had 5-6 episodes of stools a day for the last week.  We will obtain stool studies, chest x-ray to eval for pneumonia, as well as CT abdomen pelvis, to evaluate for partial bowel obstruction.  Will give fluids and given that her pressure is a little low, and as she has been having a lot of diarrhea.  Amount and/or Complexity of Data Reviewed Labs: ordered.    Details: Electrolytes reassuring, sodium 133, positive COVID test, trace bacteria in urine, initial urine was very dirty, reflective improved. Radiology: ordered.    Details: Chest x-ray shows no acute findings of pneumonia, CT abdomen pelvis, shows no acute findings ECG/medicine tests:  Decision-making details documented in ED Course. Discussion of management or test interpretation with external provider(s): Discussed with patient, she has a possible findings of pneumonia on her CAT scan however this is not completely visualized, chest x-ray is reassuring she has no respiratory symptoms, we will hold off on treating.  Initially found to have  COVID, I believe this is likely the etiology of her diarrhea.  She was unable to give [place] a stool sample here, but this is reassuring, that she has COVID, but believe that C. difficile is less likely.  We will have her follow-up with her primary care doctor, and Zofran sent to the pharmacy.  She was educated on treatment for diarrhea at home.  And return precautions were emphasized.  She did have trace bacteria in her urine, and leukocytes, white blood cells, however she does not have any symptoms.  Treating, versus not treating, she will follow-up with her primary care doctor for this.  Risk Prescription drug management.    Final Clinical Impression(s) / ED Diagnoses Final diagnoses:  COVID-19  Diarrhea, unspecified type  Weakness    Rx / DC Orders ED Discharge Orders          Ordered    ondansetron (ZOFRAN-ODT) 4 MG disintegrating tablet  Every 8 hours PRN        04/06/23 1638              Onyekachi Gathright Elbert Ewings, PA 04/06/23 1708    Benjiman Core, MD 04/07/23 0007

## 2023-04-06 NOTE — ED Notes (Signed)
 RN reviewed discharge instructions with pt. Pt verbalized understanding and had no further questions. VSS upon discharge.  

## 2023-04-06 NOTE — ED Triage Notes (Signed)
Patient arrives in wheelchair by POV c/o diarrhea, nausea, lack of appetite and fatigue. Patient states she was here a couple weeks ago with same complaints. Followed up with PCP for low sodium and magnesium. States she has been trying to take the supplements but thinks they are causing the diarrhea.

## 2023-04-06 NOTE — ED Notes (Signed)
Patient transported to CT 

## 2023-04-06 NOTE — ED Notes (Signed)
Pt unable to give stool sample at this time.

## 2023-04-06 NOTE — Discharge Instructions (Signed)
Please follow-up with primary care doctor in 1 week, to recheck your labs.  I am suspicious that your diarrhea is likely secondary to COVID.  Make sure you are drinking lots of fluids, and resting as well as eating foods as stated above to relieve your diarrhea.  You can take some Imodium to help with this.  If you feel like your conditions are getting worse, you become very short of breath, please return to the ER

## 2023-04-06 NOTE — ED Notes (Signed)
Pt given crackers and water for PO challenge

## 2023-04-26 NOTE — Progress Notes (Signed)
Subjective:     Patient ID: Julie Lutz, female    DOB: 07-Mar-1941, 82 y.o.   MRN: [id number]  Chief Complaint  Patient presents with   Medical Management of Chronic Issues    Chronic follow-up  Bilateral feet and ankle swelling    HPI She is accompanied by her daughter.  Chronic f/u - Patient presented to the ED on 7/17 for diarrhea. Today she reports much improvement following her Covid Dx.   Bilateral feet and ankle swelling - She complains of bilateral ankle and foot swelling. She has not taken Lasix 20 mg today because she  she will be walking later today and does not take Lasix if she's on her feet for an extended period of time or out and about.  Doesn't wear compression stockings as too hard to get on.   SOB - She endorses increased SOB that she says may be attributed to her asthma-is off the spiriva currently.   HTN - Pt is on Carvedilol 25 mg BID and Amlodipine 2.5 mg daily.  Systolic Bp's running 140s. Bp at today's visit was 112/62. No ha/dizziness/cp/palp/cough.  Right Leg Pain - She complains of right leg pain that is intermittent. She says the pain can wake her up from her sleep, and is the worst in the mornings, improving throughout the day. Denies visiting physical therapy. 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  In the past, the injections never worked nor did PT  Back Pain - States that her back pain has not improved. She denies any relief from physical therapy. Frequently takes OTC medication such as Tylenol or advil for relief.  Low Sodium - Currently on 1g sodium chloride BID. She admits to often forgetting to take her second dose.  No dizziness Insomnia-xanax 0.5mg  at hs for very long time.    Health Maintenance Due  Topic Date Due   DTaP/Tdap/Td (1 - Tdap) Never done    Past Medical History:  Diagnosis Date   Allergy    Arthritis    Asthma    esosinophillic   Cancer (HCC)    vocal cord   Cataract    GERD  (gastroesophageal reflux disease)    Hyperlipidemia    Hypertension    Hyponatremia    Osteoporosis    Spinal stenosis     Past Surgical History:  Procedure Laterality Date   FEMUR FRACTURE SURGERY Right 08/2016   HERNIA REPAIR Right    inguinal   JOINT REPLACEMENT  2017   bilateral knee   LAMINECTOMY  11/2020   L3-5   LARYNGOSCOPY  11/2021   SPINAL FUSION  03/2021     Current Outpatient Medications:    albuterol (PROVENTIL HFA) 108 (90 Base) MCG/ACT inhaler, Inhale 1-2 puffs into the lungs every 6 (six) hours as needed for wheezing or shortness of breath., Disp: , Rfl:    albuterol (PROVENTIL) (2.5 MG/3ML) 0.083% nebulizer solution, Take 3 mLs (2.5 mg total) by nebulization every 6 (six) hours as needed for wheezing or shortness of breath., Disp: 75 mL, Rfl: 12   ALPRAZolam (XANAX) 0.5 MG tablet, Take 1 tablet (0.5 mg total) by mouth at bedtime as needed for anxiety., Disp: 90 tablet, Rfl: 1   amLODipine (NORVASC) 2.5 MG tablet, Take 1 tablet (2.5 mg total) by mouth daily., Disp: 90 tablet, Rfl: 3   atorvastatin (LIPITOR) 10 MG tablet, Take 1 tablet (10 mg total) by mouth daily., Disp: 90 tablet, Rfl:  3   azelastine (ASTELIN) 0.1 % nasal spray, Place 2 sprays into both nostrils 2 (two) times daily., Disp: 30 mL, Rfl: 12   Benralizumab (FASENRA PEN) 30 MG/ML SOAJ, Inject 1 mL (30 mg total) into the skin every 8 (eight) weeks., Disp: 1 mL, Rfl: 3   Coenzyme Q10 (COQ-10 PO), Take by mouth., Disp: , Rfl:    Cyanocobalamin (VITAMIN B 12 PO), Take by mouth., Disp: , Rfl:    fluticasone-salmeterol (ADVAIR) 250-50 MCG/ACT AEPB, Inhale 1 puff into the lungs in the morning and at bedtime., Disp: 180 each, Rfl: 3   furosemide (LASIX) 20 MG tablet, Take 1 tablet (20 mg total) by mouth daily., Disp: 90 tablet, Rfl: 3   MAGNESIUM CITRATE PO, Take 1 tablet by mouth daily., Disp: , Rfl:    methocarbamol (ROBAXIN) 500 MG tablet, Take 1-2 tablets (500-1,000 mg total) by mouth every 8 (eight) hours  as needed for muscle spasms (May cause drowsiness.)., Disp: 30 tablet, Rfl: 0   mirtazapine (REMERON) 7.5 MG tablet, Take 1 tablet (7.5 mg total) by mouth at bedtime., Disp: 90 tablet, Rfl: 3   ondansetron (ZOFRAN-ODT) 4 MG disintegrating tablet, Take 1 tablet (4 mg total) by mouth every 8 (eight) hours as needed., Disp: 20 tablet, Rfl: 0   PREVACID 30 MG capsule, TAKE 1 CAPSULE DAILY AT 12 NOON, Disp: 90 capsule, Rfl: 1   sodium chloride 1 g tablet, Take 1 tablet (1 g total) by mouth 2 (two) times daily with a meal., Disp: 60 tablet, Rfl: 1   Tiotropium Bromide Monohydrate (SPIRIVA RESPIMAT) 1.25 MCG/ACT AERS, , Disp: , Rfl:    Vitamin D, Ergocalciferol, 50 MCG (2000 UT) CAPS, Take by mouth., Disp: , Rfl:    carvedilol (COREG) 25 MG tablet, Take 1 tablet (25 mg total) by mouth 2 (two) times daily with a meal., Disp: 180 tablet, Rfl: 3   mirabegron ER (MYRBETRIQ) 50 MG TB24 tablet, Take 1 tablet (50 mg total) by mouth daily., Disp: 90 tablet, Rfl: 3  No Known Allergies ROS neg/noncontributory except as noted HPI/below      Objective:     BP 112/62   Pulse 63   Temp 98.1 F (36.7 C) (Temporal)   Resp 18   Ht 4\' 9"  (1.448 m)   Wt 141 lb (64 kg)   SpO2 95%   BMI 30.51 kg/m  Wt Readings from Last 3 Encounters:  04/27/23 141 lb (64 kg)  04/06/23 142 lb (64.4 kg)  03/30/23 142 lb (64.4 kg)    Physical Exam   Gen: WDWN NAD HEENT: NCAT, conjunctiva not injected, sclera nonicteric NECK:  supple, no thyromegaly, no nodes, no carotid bruits firm neck CARDIAC: RRR, S1S2+, no murmur. DP 2+B LUNGS: CTAB. No wheezes ABDOMEN:  BS+, soft, NTND, No HSM, no masses EXT: + edema feet and ankles-1+ MSK: no gross abnormalities. cane  NEURO: A&O x3.  CN II-XII intact.  PSYCH: normal mood. Good eye contact  Reviewed ED records.     Assessment & Plan:  Hyponatremia -     Comprehensive metabolic panel  Hypomagnesemia -     Magnesium  Anemia, unspecified type -     CBC with  Differential/Platelet  Primary hypertension  Primary insomnia  Other orders -     Carvedilol; Take 1 tablet (25 mg total) by mouth 2 (two) times daily with a meal.  Dispense: 180 tablet; Refill: 3 -     Mirabegron ER; Take 1 tablet (50 mg total) by mouth  daily.  Dispense: 90 tablet; Refill: 3   Hyponatremia-chronic.  Better on NaCl 1mg  bid.  Check labs Hypomagnesemia-chronic.  On otc.  Hard to swallow pills of certain types.  Check Mg Anemia-chronic.  Improved.  Check cbc HTN-chronic.  Controlled.  Continue meds.  Refilled coreg 25 bid Insomnia-chronic.  Controlled on xanax 0.5mg  at hs.  Aware not a safe med for anyone/age,etc.  Has been on so long, declines changes.    Return in about 3 months (around 07/28/2023) for chronic follow-up.   Germaine Pomfret Rice,acting as a scribe for Angelena Sole, MD.,have documented all relevant documentation on the behalf of Angelena Sole, MD,as directed by  Angelena Sole, MD while in the presence of Angelena Sole, MD.   I, Angelena Sole, MD, have reviewed all documentation for this visit. The documentation on 04/27/23 for the exam, diagnosis, procedures, and orders are all accurate and complete.   Angelena Sole, MD

## 2023-04-27 ENCOUNTER — Encounter: Payer: Self-pay | Admitting: Family Medicine

## 2023-04-27 ENCOUNTER — Ambulatory Visit (INDEPENDENT_AMBULATORY_CARE_PROVIDER_SITE_OTHER): Payer: Medicare Other | Admitting: Family Medicine

## 2023-04-27 VITALS — BP 112/62 | HR 63 | Temp 98.1°F | Resp 18 | Ht <= 58 in | Wt 141.0 lb

## 2023-04-27 DIAGNOSIS — D649 Anemia, unspecified: Secondary | ICD-10-CM

## 2023-04-27 DIAGNOSIS — I10 Essential (primary) hypertension: Secondary | ICD-10-CM

## 2023-04-27 DIAGNOSIS — E871 Hypo-osmolality and hyponatremia: Secondary | ICD-10-CM | POA: Diagnosis not present

## 2023-04-27 DIAGNOSIS — F5101 Primary insomnia: Secondary | ICD-10-CM

## 2023-04-27 LAB — CBC WITH DIFFERENTIAL/PLATELET
Basophils Absolute: 0 10*3/uL (ref 0.0–0.1)
Basophils Relative: 0.2 % (ref 0.0–3.0)
Eosinophils Absolute: 0 10*3/uL (ref 0.0–0.7)
Eosinophils Relative: 0 % (ref 0.0–5.0)
HCT: 34.5 % — ABNORMAL LOW (ref 36.0–46.0)
Hemoglobin: 11.3 g/dL — ABNORMAL LOW (ref 12.0–15.0)
Lymphocytes Relative: 15.5 % (ref 12.0–46.0)
Lymphs Abs: 1.2 10*3/uL (ref 0.7–4.0)
MCHC: 32.7 g/dL (ref 30.0–36.0)
MCV: 90.8 fl (ref 78.0–100.0)
Monocytes Absolute: 0.8 10*3/uL (ref 0.1–1.0)
Monocytes Relative: 10.3 % (ref 3.0–12.0)
Neutro Abs: 5.6 10*3/uL (ref 1.4–7.7)
Neutrophils Relative %: 74 % (ref 43.0–77.0)
Platelets: 365 10*3/uL (ref 150.0–400.0)
RBC: 3.8 Mil/uL — ABNORMAL LOW (ref 3.87–5.11)
RDW: 14.3 % (ref 11.5–15.5)
WBC: 7.6 10*3/uL (ref 4.0–10.5)

## 2023-04-27 LAB — COMPREHENSIVE METABOLIC PANEL
ALT: 11 U/L (ref 0–35)
AST: 15 U/L (ref 0–37)
Albumin: 4.2 g/dL (ref 3.5–5.2)
Alkaline Phosphatase: 63 U/L (ref 39–117)
BUN: 12 mg/dL (ref 6–23)
CO2: 30 mEq/L (ref 19–32)
Calcium: 9.2 mg/dL (ref 8.4–10.5)
Chloride: 91 mEq/L — ABNORMAL LOW (ref 96–112)
Creatinine, Ser: 0.95 mg/dL (ref 0.40–1.20)
GFR: 54.99 mL/min — ABNORMAL LOW (ref >60.00)
Glucose, Bld: 91 mg/dL (ref 70–99)
Potassium: 4.5 mEq/L (ref 3.5–5.1)
Sodium: 128 mEq/L — ABNORMAL LOW (ref 135–145)
Total Bilirubin: 0.4 mg/dL (ref 0.2–1.2)
Total Protein: 7 g/dL (ref 6.0–8.3)

## 2023-04-27 LAB — MAGNESIUM: Magnesium: 1.8 mg/dL (ref 1.5–2.5)

## 2023-04-27 MED ORDER — CARVEDILOL 25 MG PO TABS: 25.0000 mg | ORAL_TABLET | Freq: Two times a day (BID) | ORAL | 3 refills | Status: DC

## 2023-04-27 MED ORDER — MIRABEGRON ER 50 MG PO TB24: 50.0000 mg | ORAL_TABLET | Freq: Every day | ORAL | 3 refills | Status: DC

## 2023-04-27 NOTE — Patient Instructions (Addendum)
It was very nice to see you today!  Dr. Jodelle Red for [facility].  Check pharm on magnesium oxide 400mg -what pill looks like   PLEASE NOTE:  If you had any lab tests please let us know if you have not heard back within a few days. You may see your results on MyChart before we have a chance to review them but we will give you a call once they are reviewed by [place]. If we ordered any referrals today, please let us know if you have not heard from their office within the next week.   Please try these tips to maintain a healthy lifestyle:  Eat most of your calories during the day when you are active. Eliminate processed foods including packaged sweets (pies, cakes, cookies), reduce intake of potatoes, white bread, white pasta, and white rice. Look for whole grain options, oat flour or almond flour.  Each meal should contain half fruits/vegetables, one quarter protein, and one quarter carbs (no bigger than a computer mouse).  Cut down on sweet beverages. This includes juice, soda, and sweet tea. Also watch fruit intake, though this is a healthier sweet option, it still contains natural sugar! Limit to 3 servings daily.  Drink at least 1 glass of water with each meal and aim for at least 8 glasses per day  Exercise at least 150 minutes every week.

## 2023-04-28 ENCOUNTER — Telehealth: Payer: Self-pay | Admitting: Family Medicine

## 2023-04-28 NOTE — Progress Notes (Signed)
Hgb stable  Mg great.  Sodium still low.  Can increase sodium tabs to 3/d  also, has she heard from neph about appt?

## 2023-04-28 NOTE — Telephone Encounter (Signed)
Pt would like a call back with lab results 

## 2023-04-30 ENCOUNTER — Encounter: Payer: Self-pay | Admitting: Family Medicine

## 2023-05-17 ENCOUNTER — Ambulatory Visit (HOSPITAL_BASED_OUTPATIENT_CLINIC_OR_DEPARTMENT_OTHER): Payer: Medicare Other | Admitting: Family

## 2023-05-24 NOTE — Progress Notes (Signed)
Office Visit Note  Patient: Julie Lutz             Date of Birth: July 04, 1941           MRN: [id number]             PCP: [name], MD Referring: [name], MD Visit Date: 06/02/2023 Occupation: @GUAROCC @  Subjective:  Medication management  History of Present Illness: Julie Lutz is a 82 y.o. female with osteoarthritis and osteoporosis.  She had her last Reclast infusion in February 2024 which she tolerated well without any side effects.  She continues to take vitamin D.  She states she has been having increased pain and discomfort in her lower back and the pain is radiating to her right lower extremity.  [place] spine [place] and she is trying to establish with somebody locally.  She has been ambulating with the help of a cane.  She was accompanied by her daughter today.  She has some stiffness in her hands and her knees which is manageable.  Her both knee joints are replaced.  She has noticed pedal edema which is unchanged.    Activities of Daily Living:  Patient reports morning stiffness for 0 minutes.   Patient Reports nocturnal pain.  Difficulty dressing/grooming: Denies Difficulty climbing stairs: Denies Difficulty getting out of chair: Denies Difficulty using hands for taps, buttons, cutlery, and/or writing: Denies  Review of Systems  Constitutional:  Negative for fatigue.  HENT:  Negative for mouth sores and mouth dryness.   Eyes:  Positive for dryness.  Respiratory:  Positive for shortness of breath.        With exertion   Cardiovascular:  Negative for chest pain and palpitations.  Gastrointestinal:  Negative for blood in stool, constipation and diarrhea.  Endocrine: Negative for increased urination.  Genitourinary:  Negative for involuntary urination.  Musculoskeletal:  Positive for joint pain, gait problem, joint pain, myalgias, morning stiffness, muscle tenderness and myalgias. Negative for joint swelling and muscle weakness.   Skin:  Negative for color change, rash, hair loss and sensitivity to sunlight.  Allergic/Immunologic: Negative for susceptible to infections.  Neurological:  Negative for dizziness, numbness and headaches.  Hematological:  Negative for swollen glands.  Psychiatric/Behavioral:  Negative for depressed mood and sleep disturbance. The patient is not nervous/anxious.     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   OAB (overactive bladder) 05/25/2022   Primary insomnia 05/25/2022   Sepsis (HCC) 05/03/2018    Past Medical History:  Diagnosis Date   Allergy    Arthritis    Asthma    esosinophillic   Cancer (HCC)    vocal cord   Cataract    GERD (gastroesophageal reflux disease)    Hyperlipidemia    Hypertension    Hyponatremia    Osteoporosis    Spinal stenosis     Family History  Problem Relation Age of Onset   Diabetes Sister    Cancer Sister 33       ovarian   Anemia Sister  Allergies Daughter    Asthma Neg Hx    Past Surgical History:  Procedure Laterality Date   FEMUR FRACTURE SURGERY Right 08/2016   HERNIA REPAIR Right    inguinal   JOINT REPLACEMENT  2017   bilateral knee   LAMINECTOMY  11/2020   L3-5   LARYNGOSCOPY  11/2021   SPINAL FUSION  03/2021   Social History   Social History Narrative   2 grands   Retired Science writer History  Administered Date(s) Administered   [employer] Vaccination 10/17/2019, 11/14/2019      Objective: Vital Signs: BP (!) 157/82 (BP Location: Left Arm, Patient Position: Sitting, Cuff Size: Normal)   Pulse 69   Resp 14   Ht 4\' 9"  (1.448 m)   Wt 141 lb 3.2 oz (64 kg)   BMI 30.56 kg/m    Physical Exam Vitals and nursing note reviewed.  Constitutional:      Appearance: She is well-developed.  HENT:     Head: Normocephalic and atraumatic.  Eyes:     Conjunctiva/sclera: Conjunctivae normal.  Cardiovascular:     Rate and Rhythm: Normal rate and regular rhythm.     Heart sounds: Normal heart sounds.  Pulmonary:     Effort: Pulmonary effort is normal.     Breath sounds: Normal breath sounds.  Abdominal:     General: Bowel sounds are normal.     Palpations: Abdomen is soft.  Musculoskeletal:     Cervical back: Normal range of motion.     Right lower leg: Edema present.     Left lower leg: Edema present.  Lymphadenopathy:     Cervical: No cervical adenopathy.  Skin:    General: Skin is warm and dry.     Capillary Refill: Capillary refill takes less than 2 seconds.  Neurological:     Mental Status: She is alert and oriented to person, place, and time.  Psychiatric:        Behavior: Behavior normal.      Musculoskeletal Exam: She had limited lateral rotation of the cervical spine.  Thoracic kyphosis was noted.  She had limited painful range of motion of the lumbar spine.  Shoulder joints and elbow joints were in good range of motion.  She has severe CMC PIP and DIP thickening with subluxation of some of the PIP and DIP joints.  Hip joints were difficult to assess in the seated position.  Knee joints were replaced and were in good range of motion without discomfort.  She had bilateral pedal edema.  There was no tenderness over ankles or MTPs.  CDAI Exam: CDAI Score: -- Patient Global: --; Provider Global: -- Swollen: --; Tender: -- Joint Exam 06/02/2023   No joint exam has been documented for this visit   There is currently no information documented on the  homunculus. Go to the Rheumatology activity and complete the homunculus joint exam.  Investigation: No additional findings.  Imaging: No results found.  Recent Labs: Lab Results  Component Value Date   WBC 7.6 04/27/2023   HGB 11.3 (L) 04/27/2023   PLT 365.0 04/27/2023   NA 128 (L) 04/27/2023   K 4.5 04/27/2023   CL 91 (L) 04/27/2023   CO2 30 04/27/2023   GLUCOSE 91 04/27/2023   BUN 12 04/27/2023   CREATININE 0.95 04/27/2023   BILITOT 0.4 04/27/2023   ALKPHOS 63 04/27/2023   AST 15 04/27/2023   ALT 11 04/27/2023   PROT 7.0 04/27/2023   ALBUMIN 4.2 04/27/2023  CALCIUM 9.2 04/27/2023   GFRAA >60 05/06/2018    Speciality Comments: Reclast infusion 2016, 2017, 2021, January 2023  Procedures:  No procedures performed Allergies: Patient has no known allergies.   Assessment / Plan:     Visit Diagnoses: Age-related osteoporosis without current pathological fracture - DEXA 06/08/22: Right 1/3 distal radius BMD 0.536 with T-score -2.6. Restarted on Reclast on 11/16/2022.  Patient tolerated her Reclast infusion well.  She previously had Reclast infusions in 2016, 2017 and then after a gap in 2021 and 2023.  We discussed possibly repeating Reclast infusion next year in February and then get a DEXA scan in September or October 2025.  She will need a gap in the treatment after that.  Height loss - History of height loss of at least 4 inches. X-rays of the lumbar spine on 11/11/2022: Age-indeterminate compression deformity of L1 with up to 15% height loss  Other kyphosis of thoracic region - Thoracic kyphosis noted.  Primary osteoarthritis of both hands-she has severe osteoarthritis in her hands with decreased grip strength and difficulty with fine motor movement.  Joint protection was discussed.  Status post total knee replacement, bilateral-she has intermittent discomfort in her knee joints.  She good range of motion.  Spinal stenosis of lumbar region with neurogenic claudication -  lumbar laminectomy March 2022, May 2022 and number fusion July 2022 in [place] several years ago.  She has been having increased lower back pain with right-sided radiculopathy.  She will establish with a spine specialist.  Primary hypertension-blood pressure was was elevated at 157/82 today.  She was advised to monitor blood pressure closely and follow-up with the PCP.  Other medical problems are listed as follows:  Eosinophilic asthma  Gastroesophageal reflux disease without esophagitis  Vocal cord cancer (HCC)  OAB (overactive bladder)  Primary insomnia  History of sepsis  Former smoker  Orders: No orders of the defined types were placed in this encounter.  No orders of the defined types were placed in this encounter.    Follow-Up Instructions: Return in about 5 months (around 11/02/2023) for Osteoarthritis, Osteoporosis.   Pollyann Savoy, MD  Note - This record has been created using Animal nutritionist.  Chart creation errors have been sought, but may not always  have been located. Such creation errors do not reflect on  the standard of medical care.

## 2023-05-26 ENCOUNTER — Ambulatory Visit: Payer: Medicare Other | Admitting: Family Medicine

## 2023-06-02 ENCOUNTER — Encounter: Payer: Self-pay | Admitting: Rheumatology

## 2023-06-02 ENCOUNTER — Ambulatory Visit: Payer: Medicare Other | Attending: Rheumatology | Admitting: Rheumatology

## 2023-06-02 VITALS — BP 157/82 | HR 69 | Resp 14 | Ht <= 58 in | Wt 141.2 lb

## 2023-06-02 DIAGNOSIS — R29890 Loss of height: Secondary | ICD-10-CM | POA: Insufficient documentation

## 2023-06-02 DIAGNOSIS — N3281 Overactive bladder: Secondary | ICD-10-CM | POA: Insufficient documentation

## 2023-06-02 DIAGNOSIS — M40294 Other kyphosis, thoracic region: Secondary | ICD-10-CM | POA: Insufficient documentation

## 2023-06-02 DIAGNOSIS — Z8619 Personal history of other infectious and parasitic diseases: Secondary | ICD-10-CM | POA: Insufficient documentation

## 2023-06-02 DIAGNOSIS — M810 Age-related osteoporosis without current pathological fracture: Secondary | ICD-10-CM | POA: Diagnosis not present

## 2023-06-02 DIAGNOSIS — M19042 Primary osteoarthritis, left hand: Secondary | ICD-10-CM | POA: Diagnosis present

## 2023-06-02 DIAGNOSIS — Z87891 Personal history of nicotine dependence: Secondary | ICD-10-CM | POA: Insufficient documentation

## 2023-06-02 DIAGNOSIS — C320 Malignant neoplasm of glottis: Secondary | ICD-10-CM | POA: Diagnosis present

## 2023-06-02 DIAGNOSIS — F5101 Primary insomnia: Secondary | ICD-10-CM | POA: Insufficient documentation

## 2023-06-02 DIAGNOSIS — K219 Gastro-esophageal reflux disease without esophagitis: Secondary | ICD-10-CM | POA: Diagnosis present

## 2023-06-02 DIAGNOSIS — M19041 Primary osteoarthritis, right hand: Secondary | ICD-10-CM | POA: Diagnosis not present

## 2023-06-02 DIAGNOSIS — J8283 Eosinophilic asthma: Secondary | ICD-10-CM | POA: Diagnosis present

## 2023-06-02 DIAGNOSIS — I10 Essential (primary) hypertension: Secondary | ICD-10-CM | POA: Insufficient documentation

## 2023-06-02 DIAGNOSIS — Z96653 Presence of artificial knee joint, bilateral: Secondary | ICD-10-CM | POA: Diagnosis present

## 2023-06-02 DIAGNOSIS — M48062 Spinal stenosis, lumbar region with neurogenic claudication: Secondary | ICD-10-CM | POA: Insufficient documentation

## 2023-06-21 ENCOUNTER — Telehealth: Payer: Self-pay | Admitting: Family Medicine

## 2023-06-21 ENCOUNTER — Other Ambulatory Visit: Payer: Self-pay | Admitting: Family Medicine

## 2023-06-21 NOTE — Telephone Encounter (Signed)
Rx sent to the pharmacy.

## 2023-06-21 NOTE — Telephone Encounter (Signed)
Prescription Request  06/21/2023  LOV: 04/27/2023  What is the name of the medication or equipment? sodium chloride 1 g tablet   Have you contacted your pharmacy to request a refill? Yes   Which pharmacy would you like this sent to?  [id number] [place], [place] - [address]  [address] Phone: [phone] Fax: [phone]    Patient notified that their request is being sent to the clinical staff for review and that they should receive a response within 2 business days.   Please advise at Mobile [phone] (mobile)

## 2023-07-12 ENCOUNTER — Ambulatory Visit (HOSPITAL_BASED_OUTPATIENT_CLINIC_OR_DEPARTMENT_OTHER): Payer: Medicare Other | Admitting: Cardiology

## 2023-07-12 ENCOUNTER — Encounter (HOSPITAL_BASED_OUTPATIENT_CLINIC_OR_DEPARTMENT_OTHER): Payer: Self-pay | Admitting: Cardiology

## 2023-07-12 VITALS — BP 126/58 | HR 69 | Ht <= 58 in | Wt 142.0 lb

## 2023-07-12 DIAGNOSIS — I10 Essential (primary) hypertension: Secondary | ICD-10-CM

## 2023-07-12 DIAGNOSIS — R600 Localized edema: Secondary | ICD-10-CM | POA: Diagnosis not present

## 2023-07-12 DIAGNOSIS — E7800 Pure hypercholesterolemia, unspecified: Secondary | ICD-10-CM

## 2023-07-12 DIAGNOSIS — D649 Anemia, unspecified: Secondary | ICD-10-CM

## 2023-07-12 NOTE — Patient Instructions (Signed)
Medication Instructions:  Your physician recommends that you continue on your current medications as directed. Please refer to the Current Medication list given to you today.  Follow-Up: At [facility], you and your health needs are our priority.  As part of our continuing mission to provide you with exceptional heart care, we have created designated Provider Care Teams.  These Care Teams include your primary Cardiologist (physician) and Advanced Practice Providers (APPs -  Physician Assistants and Nurse Practitioners) who all work together to provide you with the care you need, when you need it.  We recommend signing up for the patient portal called "MyChart".  Sign up information is provided on this After Visit Summary.  MyChart is used to connect with patients for Virtual Visits (Telemedicine).  Patients are able to view lab/test results, encounter notes, upcoming appointments, etc.  Non-urgent messages can be sent to your provider as well.   To learn more about what you can do with MyChart, go to [email or web address].    Your next appointment:   1 year with Dr. Cristal Deer

## 2023-07-12 NOTE — Progress Notes (Signed)
Cardiology Office Note:  .    Date:  07/12/2023  ID:  Julie Lutz, DOB [birth year], MRN [id number] PCP: [name], MD  [facility] Providers Cardiologist:  Jodelle Red, MD     History of Present Illness: .    Julie Lutz is a 82 y.o. female with a hx of hypertension, hyperlipidemia, asthma, anemia, vocal cord cancer, here for follow-up and to establish care with me. She is formerly a patient of Dr. Shari Prows, last seen by her 02/03/2023. At that visit she was struggling with seasonal allergies and worsening asthma with weather changes, but she was overall doing well from a cardiovascular perspective.  Today, she is accompanied by her daughter.  Cardiovascular risk factors: Prior clinical ASCVD: None. Comorbid conditions: Hypertension - in the office her blood pressure is 126/58. This is normally similar to her home readings as well, 120s systolic. Hyperlipidemia - she has been on atorvastatin for a while, mostly for preventative therapy. Metabolic syndrome/Obesity:  Current weight 142 lbs. Chronic inflammatory conditions: None. Tobacco use history: Former smoker. Prior pertinent testing and/or incidental findings: Stress test prior to moving to the area  Exercise level: Limited by chronic back pain/issues. Walks with a cane for assistance. She complains of some shortness of breath attributable to her asthma.  She states that she was not initially started on Lasix for swelling. More recently she has noticed some LE edema, worse in her left leg. She notes that her current level of swelling is in the setting of not taking her Lasix for 2 days due to having clinic visits and not wanting to struggle with frequency. She has been wearing compression stockings.  She denies any palpitations, chest pain, lightheadedness, headaches, syncope, orthopnea, or PND.  ROS:  Please see the history of present illness. ROS otherwise negative except as noted.  (+) Shortness of  breath (+) Chronic back pain  Studies Reviewed: Marland Kitchen         Chest X-Ray  04/06/2023: FINDINGS: Heart size and pulmonary vascularity are normal. Probable emphysematous changes in the lungs. Linear scarring or atelectasis in the lung bases, greater on the left. No focal consolidation or airspace disease. No pleural effusions. No pneumothorax. Mediastinal contours appear intact. Calcified and tortuous ectatic appearance of the aorta. Degenerative changes in the spine. Postoperative changes in the lumbar spine.   IMPRESSION: Emphysematous changes in the lungs with scarring or atelectasis in the lung bases, unchanged since prior study.  ABDOMINAL AORTA STUDY  11/25/2022 Summary:  Abdominal Aorta: No evidence of an abdominal aortic aneurysm was  visualized.   Physical Exam:    VS:  BP (!) 126/58   Pulse 69   Ht 4\' 8"  (1.422 m)   Wt 142 lb (64.4 kg)   SpO2 95%   BMI 31.84 kg/m    Wt Readings from Last 3 Encounters:  07/12/23 142 lb (64.4 kg)  06/02/23 141 lb 3.2 oz (64 kg)  04/27/23 141 lb (64 kg)    GEN: Well nourished, well developed in no acute distress HEENT: Normal, moist mucous membranes NECK: No JVD CARDIAC: regular rhythm, normal S1 and S2, no rubs or gallops. No murmur. VASCULAR: Radial and DP pulses 2+ bilaterally. No carotid bruits RESPIRATORY:  Clear to auscultation without rales, wheezing or rhonchi  ABDOMEN: Soft, non-tender, non-distended MUSCULOSKELETAL:  Ambulates independently SKIN: Warm and dry, no significant LE edema today NEUROLOGIC:  Alert and oriented x 3. No focal neuro deficits noted. PSYCHIATRIC:  Normal affect   ASSESSMENT AND PLAN: .  Hypertension -continue amlodipine, furosemide, carvedilol   Chronic LE edema -continue furosemide   Hyperlipidemia -continue atorvastatin 10 mg daily as she is tolerating (primary prevention)  Chronic anemia -followed by PCP, denies active bleeding  Dispo: Follow-up in 1 year, or sooner as  needed.  I,Mathew Stumpf,acting as a Neurosurgeon for [employer], MD.,have documented all relevant documentation on the behalf of Jodelle Red, MD,as directed by  Jodelle Red, MD while in the presence of Jodelle Red, MD.  I, Jodelle Red, MD, have reviewed all documentation for this visit. The documentation on 07/12/23 for the exam, diagnosis, procedures, and orders are all accurate and complete.   Signed, Carlena Bjornstad

## 2023-07-27 ENCOUNTER — Ambulatory Visit: Payer: Medicare Other | Admitting: Family Medicine

## 2023-07-27 ENCOUNTER — Encounter: Payer: Self-pay | Admitting: Family Medicine

## 2023-07-27 VITALS — BP 132/67 | HR 68 | Temp 98.1°F | Resp 18 | Ht <= 58 in | Wt 140.2 lb

## 2023-07-27 DIAGNOSIS — E7800 Pure hypercholesterolemia, unspecified: Secondary | ICD-10-CM

## 2023-07-27 DIAGNOSIS — F5101 Primary insomnia: Secondary | ICD-10-CM

## 2023-07-27 DIAGNOSIS — K219 Gastro-esophageal reflux disease without esophagitis: Secondary | ICD-10-CM | POA: Diagnosis not present

## 2023-07-27 DIAGNOSIS — E222 Syndrome of inappropriate secretion of antidiuretic hormone: Secondary | ICD-10-CM

## 2023-07-27 DIAGNOSIS — I10 Essential (primary) hypertension: Secondary | ICD-10-CM | POA: Diagnosis not present

## 2023-07-27 DIAGNOSIS — R600 Localized edema: Secondary | ICD-10-CM

## 2023-07-27 MED ORDER — PREVACID 30 MG PO CPDR: 30.0000 mg | DELAYED_RELEASE_CAPSULE | Freq: Every day | ORAL | 3 refills | Status: DC

## 2023-07-27 MED ORDER — ALPRAZOLAM 0.5 MG PO TABS: 0.5000 mg | ORAL_TABLET | Freq: Every evening | ORAL | 1 refills | Status: DC | PRN

## 2023-07-27 NOTE — Progress Notes (Signed)
Subjective:    Patient ID: Julie Lutz, female    DOB: 09/12/1941, 82 y.o.   MRN: [id number]  Chief Complaint  Patient presents with   Medical Management of Chronic Issues    3 month follow-up Not fasting  Pt is accompanied by her daughter.   HPI HTN - Pt is on Carvedilol 25 mg BID and Amlodipine 2.5 mg daily. Bp's running 130-140/60s . No ha/dizziness/cp/palp/cough/sob. Improvement wit her swelling.  Bp at today's visit was 132/67.   Low Sodium - Pt is on on 1g sodium chloride BID, taking OTC, less money. Daughter denies dizziness or confusion.  Insomnia - Chronic. Xanax 0.5 mg and mirtazapine 7.5 mg daily hs. No SI.   HLD - Complaint with Lipitor 10 mg daily, responding well.   Health Maintenance Due  Topic Date Due   DTaP/Tdap/Td (1 - Tdap) Never done    Past Medical History:  Diagnosis Date   Allergy    Arthritis    Asthma    esosinophillic   Cancer (HCC)    vocal cord   Cataract    GERD (gastroesophageal reflux disease)    Hyperlipidemia    Hypertension    Hyponatremia    Osteoporosis    Spinal stenosis     Past Surgical History:  Procedure Laterality Date   FEMUR FRACTURE SURGERY Right 08/2016   HERNIA REPAIR Right    inguinal   JOINT REPLACEMENT  2017   bilateral knee   LAMINECTOMY  11/2020   L3-5   LARYNGOSCOPY  11/2021   SPINAL FUSION  03/2021     Current Outpatient Medications:    albuterol (PROVENTIL HFA) 108 (90 Base) MCG/ACT inhaler, Inhale 1-2 puffs into the lungs every 6 (six) hours as needed for wheezing or shortness of breath., Disp: , Rfl:    albuterol (PROVENTIL) (2.5 MG/3ML) 0.083% nebulizer solution, Take 3 mLs (2.5 mg total) by nebulization every 6 (six) hours as needed for wheezing or shortness of breath., Disp: 75 mL, Rfl: 12   ALPRAZolam (XANAX) 0.5 MG tablet, Take 1 tablet (0.5 mg total) by mouth at bedtime as needed for anxiety., Disp: 90 tablet, Rfl: 1   amLODipine (NORVASC) 2.5 MG tablet, Take 1 tablet (2.5 mg total) by mouth  daily., Disp: 90 tablet, Rfl: 3   atorvastatin (LIPITOR) 10 MG tablet, Take 1 tablet (10 mg total) by mouth daily., Disp: 90 tablet, Rfl: 3   azelastine (ASTELIN) 0.1 % nasal spray, Place 2 sprays into both nostrils 2 (two) times daily., Disp: 30 mL, Rfl: 12   Benralizumab (FASENRA PEN) 30 MG/ML SOAJ, Inject 1 mL (30 mg total) into the skin every 8 (eight) weeks., Disp: 1 mL, Rfl: 3   carvedilol (COREG) 25 MG tablet, Take 1 tablet (25 mg total) by mouth 2 (two) times daily with a meal., Disp: 180 tablet, Rfl: 3   Coenzyme Q10 (COQ-10 PO), Take by mouth., Disp: , Rfl:    Cyanocobalamin (VITAMIN B 12 PO), Take by mouth., Disp: , Rfl:    Ferrous Sulfate (IRON PO), Take by mouth every other day., Disp: , Rfl:    fluticasone-salmeterol (ADVAIR) 250-50 MCG/ACT AEPB, Inhale 1 puff into the lungs in the morning and at bedtime., Disp: 180 each, Rfl: 3   furosemide (LASIX) 20 MG tablet, Take 1 tablet (20 mg total) by mouth daily. (Patient taking differently: Take 40 mg by mouth daily.), Disp: 90 tablet, Rfl: 3   Magnesium Oxide (MAG-OXIDE PO), Take by mouth daily., Disp: , Rfl:  mirabegron ER (MYRBETRIQ) 50 MG TB24 tablet, Take 1 tablet (50 mg total) by mouth daily., Disp: 90 tablet, Rfl: 3   mirtazapine (REMERON) 7.5 MG tablet, Take 1 tablet (7.5 mg total) by mouth at bedtime., Disp: 90 tablet, Rfl: 3   ondansetron (ZOFRAN-ODT) 4 MG disintegrating tablet, Take 1 tablet (4 mg total) by mouth every 8 (eight) hours as needed., Disp: 20 tablet, Rfl: 0   PREVACID 30 MG capsule, TAKE 1 CAPSULE DAILY AT 12 NOON, Disp: 90 capsule, Rfl: 1   sodium chloride 1 g tablet, TAKE ONE TABLET BY MOUTH TWICE A DAY WITH A MEAL, Disp: 60 tablet, Rfl: 1   Tiotropium Bromide Monohydrate (SPIRIVA RESPIMAT) 1.25 MCG/ACT AERS, , Disp: , Rfl:    Vitamin D, Ergocalciferol, 50 MCG (2000 UT) CAPS, Take by mouth daily at 6 (six) AM., Disp: , Rfl:   No Known Allergies ROS neg/noncontributory except as noted HPI/below       Objective:     BP 132/67   Pulse 68   Temp 98.1 F (36.7 C) (Temporal)   Resp 18   Ht 4\' 8"  (1.422 m)   Wt 140 lb 4 oz (63.6 kg)   SpO2 96%   BMI 31.44 kg/m  Wt Readings from Last 3 Encounters:  07/27/23 140 lb 4 oz (63.6 kg)  07/12/23 142 lb (64.4 kg)  06/02/23 141 lb 3.2 oz (64 kg)   Physical Exam   Gen: WDWN NAD HEENT: NCAT, conjunctiva not injected, sclera nonicteric NECK:  supple, no thyromegaly, no nodes, no carotid bruits CARDIAC: RRR, S1S2+, no murmur. DP 2+B LUNGS: CTAB. No wheezes ABDOMEN:  BS+, soft, NTND, No HSM, no masses EXT:  no edema MSK: no gross abnormalities.  NEURO: A&O x3.  CN II-XII intact.  PSYCH: normal mood. Good eye contact     Assessment & Plan:  There are no diagnoses linked to this encounter.  Return in about 3 months (around 10/27/2023) for chronic follow-up.  Melina Fiddler N Rice,acting as a scribe for Angelena Sole, MD.,have documented all relevant documentation on the behalf of Angelena Sole, MD,as directed by  Angelena Sole, MD while in the presence of Angelena Sole, MD.  Juleen Starr, have reviewed all documentation for this visit. The documentation on 07/27/23 for the exam, diagnosis, procedures, and orders are all accurate and complete. ***  Anaiya N Rice

## 2023-07-28 DIAGNOSIS — R600 Localized edema: Secondary | ICD-10-CM | POA: Insufficient documentation

## 2023-07-28 DIAGNOSIS — E222 Syndrome of inappropriate secretion of antidiuretic hormone: Secondary | ICD-10-CM | POA: Insufficient documentation

## 2023-07-28 NOTE — Assessment & Plan Note (Signed)
Controlled.  Continue carvedilol 25 mg twice daily and amlodipine 2.5mg  daily

## 2023-07-28 NOTE — Assessment & Plan Note (Signed)
Chronic.  Still with hyponatremia despite salt tabs twice daily.  Currently seeing nephrology.  Has labs tomorrow.  They will let us know results so I can determine what other labs she may need

## 2023-07-28 NOTE — Assessment & Plan Note (Signed)
Chronic.  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  Continue meds.

## 2023-07-28 NOTE — Assessment & Plan Note (Signed)
Chronic.  Controlled on furosemide 40 mg daily.  Managed by nephrology and cardiology

## 2023-07-28 NOTE — Assessment & Plan Note (Signed)
Chronic.  Controlled.  Continue mirtazapine 7.5 mg and Xanax 0.5 mg nightly.  She is aware of risks, but has been doing this for so many years, does not want to make any changes

## 2023-07-28 NOTE — Assessment & Plan Note (Signed)
Chronic Controlled Continue atorvastatin 10 mg daily.

## 2023-07-28 NOTE — Assessment & Plan Note (Signed)
Chronic.  Controlled.  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  Continue Prevacid 30 mg daily

## 2023-08-02 ENCOUNTER — Encounter: Payer: Self-pay | Admitting: Family Medicine

## 2023-08-09 ENCOUNTER — Telehealth: Payer: Self-pay | Admitting: Family Medicine

## 2023-08-09 ENCOUNTER — Other Ambulatory Visit: Payer: Self-pay | Admitting: *Deleted

## 2023-08-09 DIAGNOSIS — D649 Anemia, unspecified: Secondary | ICD-10-CM

## 2023-08-09 DIAGNOSIS — Z79899 Other long term (current) drug therapy: Secondary | ICD-10-CM

## 2023-08-09 DIAGNOSIS — R790 Abnormal level of blood mineral: Secondary | ICD-10-CM

## 2023-08-09 NOTE — Addendum Note (Signed)
Addended by: Angelena Sole on: 08/09/2023 09:03 PM   Modules accepted: Orders

## 2023-08-09 NOTE — Telephone Encounter (Signed)
Pt called back and the Kidney Dr told her to stop taking an over the counter drug and its making her feel bad. Please advise.

## 2023-08-09 NOTE — Telephone Encounter (Signed)
Pt would like an order for blood work for CBC and electrolytes. Please advise.

## 2023-08-09 NOTE — Telephone Encounter (Signed)
Lab orders placed.  

## 2023-08-09 NOTE — Telephone Encounter (Signed)
Patient stated that she was told by Dr. Signe Colt to stop taking the Sodium pill, she has been nauseous and fatigued since. Patient wants to know if she should continue to stay off or start back on medication. Please advise.

## 2023-08-10 ENCOUNTER — Encounter: Payer: Self-pay | Admitting: *Deleted

## 2023-08-10 NOTE — Telephone Encounter (Signed)
Patient notified of message below.

## 2023-08-11 ENCOUNTER — Other Ambulatory Visit (INDEPENDENT_AMBULATORY_CARE_PROVIDER_SITE_OTHER): Payer: Medicare Other

## 2023-08-11 ENCOUNTER — Encounter: Payer: Self-pay | Admitting: Internal Medicine

## 2023-08-11 ENCOUNTER — Ambulatory Visit: Payer: Medicare Other | Admitting: Internal Medicine

## 2023-08-11 VITALS — BP 126/76 | HR 74 | Temp 97.8°F | Ht <= 58 in | Wt 139.4 lb

## 2023-08-11 DIAGNOSIS — J4550 Severe persistent asthma, uncomplicated: Secondary | ICD-10-CM | POA: Diagnosis not present

## 2023-08-11 DIAGNOSIS — R490 Dysphonia: Secondary | ICD-10-CM | POA: Diagnosis not present

## 2023-08-11 DIAGNOSIS — Z79899 Other long term (current) drug therapy: Secondary | ICD-10-CM | POA: Diagnosis not present

## 2023-08-11 DIAGNOSIS — D649 Anemia, unspecified: Secondary | ICD-10-CM | POA: Diagnosis not present

## 2023-08-11 DIAGNOSIS — E871 Hypo-osmolality and hyponatremia: Secondary | ICD-10-CM | POA: Diagnosis not present

## 2023-08-11 DIAGNOSIS — D7219 Other eosinophilia: Secondary | ICD-10-CM | POA: Diagnosis not present

## 2023-08-11 DIAGNOSIS — K219 Gastro-esophageal reflux disease without esophagitis: Secondary | ICD-10-CM

## 2023-08-11 DIAGNOSIS — R790 Abnormal level of blood mineral: Secondary | ICD-10-CM | POA: Diagnosis not present

## 2023-08-11 DIAGNOSIS — Z23 Encounter for immunization: Secondary | ICD-10-CM

## 2023-08-11 LAB — BASIC METABOLIC PANEL
BUN: 20 mg/dL (ref 6–23)
CO2: 29 meq/L (ref 19–32)
Calcium: 8.9 mg/dL (ref 8.4–10.5)
Chloride: 93 meq/L — ABNORMAL LOW (ref 96–112)
Creatinine, Ser: 1.12 mg/dL (ref 0.40–1.20)
GFR: 45.04 mL/min — ABNORMAL LOW (ref >60.00)
Glucose, Bld: 109 mg/dL — ABNORMAL HIGH (ref 70–99)
Potassium: 3.8 meq/L (ref 3.5–5.1)
Sodium: 131 meq/L — ABNORMAL LOW (ref 135–145)

## 2023-08-11 LAB — CBC WITH DIFFERENTIAL/PLATELET
Basophils Absolute: 0 10*3/uL (ref 0.0–0.1)
Basophils Relative: 0.1 % (ref 0.0–3.0)
Eosinophils Absolute: 0 10*3/uL (ref 0.0–0.7)
Eosinophils Relative: 0 % (ref 0.0–5.0)
HCT: 35.2 % — ABNORMAL LOW (ref 36.0–46.0)
Hemoglobin: 11.7 g/dL — ABNORMAL LOW (ref 12.0–15.0)
Lymphocytes Relative: 16.1 % (ref 12.0–46.0)
Lymphs Abs: 1.1 10*3/uL (ref 0.7–4.0)
MCHC: 33.2 g/dL (ref 30.0–36.0)
MCV: 90.7 fL (ref 78.0–100.0)
Monocytes Absolute: 0.7 10*3/uL (ref 0.1–1.0)
Monocytes Relative: 10.4 % (ref 3.0–12.0)
Neutro Abs: 5 10*3/uL (ref 1.4–7.7)
Neutrophils Relative %: 73.4 % (ref 43.0–77.0)
Platelets: 350 10*3/uL (ref 150.0–400.0)
RBC: 3.89 Mil/uL (ref 3.87–5.11)
RDW: 14.2 % (ref 11.5–15.5)
WBC: 6.8 10*3/uL (ref 4.0–10.5)

## 2023-08-11 LAB — COMPREHENSIVE METABOLIC PANEL
ALT: 12 U/L (ref 0–35)
AST: 14 U/L (ref 0–37)
Albumin: 4.1 g/dL (ref 3.5–5.2)
Alkaline Phosphatase: 72 U/L (ref 39–117)
BUN: 20 mg/dL (ref 6–23)
CO2: 29 meq/L (ref 19–32)
Calcium: 8.9 mg/dL (ref 8.4–10.5)
Chloride: 93 meq/L — ABNORMAL LOW (ref 96–112)
Creatinine, Ser: 1.12 mg/dL (ref 0.40–1.20)
GFR: 45.04 mL/min — ABNORMAL LOW (ref >60.00)
Glucose, Bld: 109 mg/dL — ABNORMAL HIGH (ref 70–99)
Potassium: 3.8 meq/L (ref 3.5–5.1)
Sodium: 131 meq/L — ABNORMAL LOW (ref 135–145)
Total Bilirubin: 0.4 mg/dL (ref 0.2–1.2)
Total Protein: 6.8 g/dL (ref 6.0–8.3)

## 2023-08-11 LAB — MAGNESIUM: Magnesium: 1.5 mg/dL (ref 1.5–2.5)

## 2023-08-11 NOTE — Progress Notes (Signed)
Julie Lutz    [id number]    1940-12-08  Primary Care Physician:Kulik, Maryruth Hancock, MD Date of Appointment: 08/11/2023 Established Patient Visit  Chief complaint:   Chief Complaint  Patient presents with   Follow-up    Doing well.     HPI: Julie Lutz is a 82 y.o. 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 Has been on fasenra since about 2020.  Interval Updates: Here for follow up  Doing well with asthma, no interval exacerbations or ED visits. Has not needed prednisone. Minimal albuterol use 1-2 times/week Did try coming off spiriva but her albuterol use increased  Current Regimen: advair 250 1 puff twice a day, spiriva once daily 1 puff daily, fasenra Asthma Triggers: URIs, exertion Exacerbations in the last year: none in the last year.  History of hospitalization or intubation:never Allergy Testing: yes  GERD: occasional, on pepcid.  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  ACT:  Asthma Control Test ACT Total Score  02/10/2023  1:22 PM 19  07/08/2022  1:09 PM 21     I have reviewed the patient's family social and past medical history and updated as appropriate.   Past Medical History:  Diagnosis Date   Allergy    Arthritis    Asthma    esosinophillic   Cancer (HCC)    vocal cord   Cataract    GERD (gastroesophageal reflux disease)    Hyperlipidemia    Hypertension    Hyponatremia    Osteoporosis    Spinal stenosis     Past Surgical History:  Procedure Laterality Date   FEMUR FRACTURE SURGERY Right 08/2016   HERNIA REPAIR Right    inguinal   JOINT REPLACEMENT  2017   bilateral knee   LAMINECTOMY  11/2020   L3-5   LARYNGOSCOPY  11/2021   SPINAL FUSION  03/2021    Family History  Problem Relation Age of Onset   Diabetes Sister    Cancer Sister 54       ovarian   Anemia Sister    Allergies Daughter    Asthma Neg Hx     Social History   Occupational History    Not on file  Tobacco Use   Smoking status: Former    Current packs/day: 0.00    Types: Cigarettes    Quit date: 1990    Years since quitting: 34.9    Passive exposure: Never   Smokeless tobacco: Never  Vaping Use   Vaping status: Never Used  Substance and Sexual Activity   Alcohol use: Yes    Alcohol/week: 5.0 standard drinks of alcohol    Types: 5 Glasses of wine per week    Comment: occasional   Drug use: Never   Sexual activity: Not Currently     Physical Exam: Blood pressure 126/76, pulse 74, temperature 97.8 F (36.6 C), temperature source Oral, height 4\' 9"  (1.448 m), weight 139 lb 6.4 oz (63.2 kg), SpO2 96%.  Gen:      No acute distress, hoarse voice ENT: mmm Lungs:   kyphosis, ctab, no wheeze CV:         RRR no mrg   Data Reviewed: Imaging: I have personally reviewed the chest xray July 2024 shows hyperinflation  PFTs:      No data to display         I have personally reviewed the patient's PFTs and   Labs: Lab  Results  Component Value Date   NA 128 (L) 04/27/2023   K 4.5 04/27/2023   CO2 30 04/27/2023   GLUCOSE 91 04/27/2023   BUN 12 04/27/2023   CREATININE 0.95 04/27/2023   CALCIUM 9.2 04/27/2023   EGFR 54 (L) 08/19/2022   GFRNONAA >60 04/06/2023   Lab Results  Component Value Date   WBC 7.6 04/27/2023   HGB 11.3 (L) 04/27/2023   HCT 34.5 (L) 04/27/2023   MCV 90.8 04/27/2023   PLT 365.0 04/27/2023  Eosinophils 400 in August 2019  Immunization status: Immunization History  Administered Date(s) Administered   [employer] Vaccination 10/17/2019, 11/14/2019    External Records Personally Reviewed:   Assessment:  Severe persistent asthma, on benralizumab, controlled GERD, controlled Chronic rhinitis, controlled H/o 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   Continue the advair, spiriva, albuterol and fasenra Continueallergy meds  with astelin.  We will give you your flu shot today.   Call sooner if questions or concerns about your breathing.   Return to Care: Return in about 1 year (around 08/10/2024).   Durel Salts, MD Pulmonary and Critical Care Medicine [facility] Office:[phone]

## 2023-08-11 NOTE — Addendum Note (Signed)
Addended byClyda Greener M on: 08/11/2023 12:56 PM   Modules accepted: Orders

## 2023-08-11 NOTE — Patient Instructions (Signed)
It was a pleasure to see you today!  Please schedule follow up scheduled with myself in 1 year.  If my schedule is not open yet, we will contact you with a reminder closer to that time. Please call [phone] if you haven't heard from [place] a month before, and always call us sooner if issues or concerns arise. You can also send [place] a message through MyChart, but but aware that this is not to be used for urgent issues and it may take up to 5-7 days to receive a reply. Please be aware that you will likely be able to view your results before I have a chance to respond to them. Please give [place] 5 business days to respond to any non-urgent results.   Glad the asthma and allergies are doing well.   Continue the advair, spiriva, albuterol and fasenra Continueallergy meds with astelin.  We will give you your flu shot today.   Call sooner if questions or concerns about your breathing.

## 2023-08-12 LAB — IRON,TIBC AND FERRITIN PANEL
%SAT: 16 % (ref 16–45)
Ferritin: 83 ng/mL (ref 16–288)
Iron: 51 ug/dL (ref 45–160)
TIBC: 321 ug/dL (ref 250–450)

## 2023-08-12 NOTE — Progress Notes (Signed)
Labs are stable.  Iron better.  Continue same meds

## 2023-10-07 ENCOUNTER — Telehealth: Payer: Self-pay | Admitting: Internal Medicine

## 2023-10-07 NOTE — Telephone Encounter (Signed)
Patient needs a refill of Advire with refills   [facility]

## 2023-10-10 MED ORDER — FLUTICASONE-SALMETEROL 250-50 MCG/ACT IN AEPB: 1.0000 | INHALATION_SPRAY | Freq: Two times a day (BID) | RESPIRATORY_TRACT | 3 refills | Status: DC

## 2023-10-10 NOTE — Telephone Encounter (Signed)
PT calling again for refill or Advair.

## 2023-10-10 NOTE — Telephone Encounter (Signed)
Rx refill for advair sent to preferred pharmacy  I called the pt and notified this was done  Nothing further needed

## 2023-10-19 NOTE — Progress Notes (Unsigned)
Office Visit Note  Patient: Julie Lutz             Date of Birth: 1941/06/12           MRN: [id number]             PCP: [name], MD Referring: [name], MD Visit Date: 11/02/2023 Occupation: @GUAROCC @  Subjective:  Discuss next reclast infusion   History of Present Illness: Julie Lutz is a 83 y.o. female with history of osteoporosis and osteoarthritis.  Patient was last Reclast infusion was administered on 11/16/2022.  Patient had updated lab work drawn on 10/27/2023 and is anticipating having her next Reclast infusion at the end of February 2025.  She is aware that she is due to have an updated bone density at the end of September 2025.  Patient has been using either a cane or a walker to assist with ambulation.  She denies any recent falls.  She is making vitamin D 2000 units daily and obtains calcium through her diet.  She continues to have chronic pain in her lower back.  Patient had epidural injection in December 2024 which abided relief only for 1 day.    Activities of Daily Living:  Patient reports morning stiffness for 0 minutes.   Patient Reports nocturnal pain.  Difficulty dressing/grooming: Denies Difficulty climbing stairs: Reports Difficulty getting out of chair: Denies Difficulty using hands for taps, buttons, cutlery, and/or writing: Denies  Review of Systems  Constitutional:  Negative for fatigue.  HENT:  Negative for mouth sores and mouth dryness.   Eyes:  Positive for dryness.  Respiratory:  Positive for hemoptysis. Negative for shortness of breath.   Cardiovascular:  Negative for chest pain and palpitations.  Gastrointestinal:  Negative for blood in stool, constipation and diarrhea.  Endocrine: Negative for increased urination.  Genitourinary:  Negative for involuntary urination.  Musculoskeletal:  Positive for joint pain, joint pain, myalgias and myalgias. Negative for gait problem, joint swelling, muscle weakness, morning stiffness and  muscle tenderness.  Skin:  Negative for color change, rash, hair loss and sensitivity to sunlight.  Allergic/Immunologic: Negative for susceptible to infections.  Neurological:  Negative for dizziness and headaches.  Hematological:  Negative for swollen glands.  Psychiatric/Behavioral:  Negative for depressed mood and sleep disturbance. The patient is not nervous/anxious.     PMFS History:  Patient Active Problem List   Diagnosis Date Noted   SIADH (syndrome of inappropriate ADH production) (HCC) 07/28/2023   Local edema 07/28/2023   Hyponatremia 04/27/2023   Hypomagnesemia 04/27/2023   Pure hypercholesterolemia 11/23/2022   Age-related osteoporosis without current pathological fracture 11/03/2022   High risk medication use 10/26/2022   Absolute anemia 10/26/2022   Primary hypertension 05/25/2022   Gastroesophageal reflux disease without esophagitis 05/25/2022   Localized osteoporosis without current pathological fracture 05/25/2022   Eosinophilic asthma 05/25/2022   Vocal cord cancer (HCC) 05/25/2022   OAB (overactive bladder) 05/25/2022   Primary insomnia 05/25/2022   Sepsis (HCC) 05/03/2018    Past Medical History:  Diagnosis Date   Allergy    Arthritis    Asthma    esosinophillic   Cancer (HCC)    vocal cord   Cataract    GERD (gastroesophageal reflux disease)    Hyperlipidemia    Hypertension    Hyponatremia    Osteoporosis    Spinal stenosis     Family History  Problem Relation Age of Onset   Diabetes Sister    Cancer Sister 47  ovarian   Anemia Sister    Allergies Daughter    Asthma Neg Hx    Past Surgical History:  Procedure Laterality Date   FEMUR FRACTURE SURGERY Right 08/2016   HERNIA REPAIR Right    inguinal   JOINT REPLACEMENT  2017   bilateral knee   LAMINECTOMY  11/2020   L3-5   LARYNGOSCOPY  11/2021   SPINAL FUSION  03/2021   Social History   Social History Narrative   2 grands   Retired Science writer History   Administered Date(s) Administered   Fluad Quad(high Dose 65+) 06/10/2022   Fluad Trivalent(High Dose 65+) 08/11/2023   Moderna Sars-Covid-2 Vaccination 10/17/2019, 11/14/2019   PNEUMOCOCCAL CONJUGATE-20 10/27/2023     Objective: Vital Signs: BP (!) 149/82 (BP Location: Left Arm, Patient Position: Sitting, Cuff Size: Small)   Pulse 64   Resp 12   Ht 4\' 9"  (1.448 m)   Wt 143 lb 3.2 oz (65 kg)   BMI 30.99 kg/m    Physical Exam Vitals and nursing note reviewed.  Constitutional:      Appearance: She is well-developed.  HENT:     Head: Normocephalic and atraumatic.  Eyes:     Conjunctiva/sclera: Conjunctivae normal.  Cardiovascular:     Rate and Rhythm: Normal rate and regular rhythm.     Heart sounds: Normal heart sounds.  Pulmonary:     Effort: Pulmonary effort is normal.     Breath sounds: Normal breath sounds.  Abdominal:     General: Bowel sounds are normal.     Palpations: Abdomen is soft.  Musculoskeletal:     Cervical back: Normal range of motion.  Lymphadenopathy:     Cervical: No cervical adenopathy.  Skin:    General: Skin is warm and dry.     Capillary Refill: Capillary refill takes less than 2 seconds.  Neurological:     Mental Status: She is alert and oriented to person, place, and time.  Psychiatric:        Behavior: Behavior normal.      Musculoskeletal Exam: C-spine has limited range of motion especially with lateral rotation to the left.  Thoracic kyphosis noted.  Limited mobility of the thoracic and lumbar spine.  Shoulder joints and elbow joints have good range of motion.  CMC, PIP, DIP thickening consistent with osteoarthritis of both hands.  Knee replacements have good ROM with no effusion.  Ankle joints have good ROM with no tenderness or joint swelling.   CDAI Exam: CDAI Score: -- Patient Global: --; Provider Global: -- Swollen: --; Tender: -- Joint Exam 11/02/2023   No joint exam has been documented for this visit   There is currently no  information documented on the homunculus. Go to the Rheumatology activity and complete the homunculus joint exam.  Investigation: No additional findings.  Imaging: No results found.  Recent Labs: Lab Results  Component Value Date   WBC 7.3 10/27/2023   HGB 11.1 (L) 10/27/2023   PLT 327.0 10/27/2023   NA 129 (L) 10/27/2023   K 4.9 10/27/2023   CL 91 (L) 10/27/2023   CO2 29 10/27/2023   GLUCOSE 88 10/27/2023   BUN 23 10/27/2023   CREATININE 1.11 10/27/2023   BILITOT 0.5 10/27/2023   ALKPHOS 63 10/27/2023   AST 14 10/27/2023   ALT 12 10/27/2023   PROT 6.6 10/27/2023   ALBUMIN 4.2 10/27/2023   CALCIUM 9.2 10/27/2023   GFRAA >60 05/06/2018    Speciality Comments: Reclast infusion 2016,  2017, 2021, January 2023  Procedures:  No procedures performed Allergies: Patient has no known allergies.   Assessment / Plan:     Visit Diagnoses: Age-related osteoporosis without current pathological fracture:  DEXA 06/08/22: Right 1/3 distal radius BMD 0.536 with T-score -2.6. Restarted on Reclast on 11/16/2022.  She previously had Reclast infusions in 2016, 2017 and then after a gap in 2021 and 2023.  She has tolerated Reclast in the past without any side effects.  Plan to scheduled reclast infusion this month and then will update DEXA in September 2025.  CBC and CMP updated on 10/27/23-creatinine was 1.11 and GFR was 45.46--stable-followed by Dr. Signe Colt every 6 months.  Vitamin D was 47.88 on 10/27/23.  She is taking vitamin D 2000 units daily and obtains calcium through her diet.  She has been using a cane or a walker to assist with ambulation.  She has not had any recent falls or fractures. She will follow up in 6 months or sooner if needed-order for DEXA will be placed at next visit.   Height loss - History of height loss of at least 4 inches. X-rays of the lumbar spine on 11/11/2022: Age-indeterminate compression deformity of L1 with up to 15% height loss  Other kyphosis of thoracic region:  Thoracic kyphosis noted.   Primary osteoarthritis of both hands: She has PIP and DIP thickening consistent with osteoarthritis of both hands.  CMC joint prominence bilaterally.  No synovitis noted on exam.  Status post total knee replacement, bilateral: Doing well.  No warmth or effusion noted.  Spinal stenosis of lumbar region with neurogenic claudication - Lumbar laminectomy March 2022, May 2022 and fusion July 2022 in [place].  Chronic pain.  Patient had epidural injection in December 2024 but had no relief.  She is using a cane or walker to assist with ambulation.  Other medical conditions are listed as follows:   Primary hypertension: BP was 149/82 today in the office.    Eosinophilic asthma  Gastroesophageal reflux disease without esophagitis  Vocal cord cancer (HCC)  OAB (overactive bladder)  Primary insomnia  History of sepsis  Former smoker  Orders: No orders of the defined types were placed in this encounter.  No orders of the defined types were placed in this encounter.    Follow-Up Instructions: Return in about 6 months (around 05/01/2024) for Osteoporosis.   Gearldine Bienenstock, PA-C  Note - This record has been created using Dragon software.  Chart creation errors have been sought, but may not always  have been located. Such creation errors do not reflect on  the standard of medical care.

## 2023-10-20 ENCOUNTER — Other Ambulatory Visit: Payer: Self-pay | Admitting: Internal Medicine

## 2023-10-20 DIAGNOSIS — J4550 Severe persistent asthma, uncomplicated: Secondary | ICD-10-CM

## 2023-10-27 ENCOUNTER — Encounter: Payer: Self-pay | Admitting: Family Medicine

## 2023-10-27 ENCOUNTER — Ambulatory Visit: Payer: Medicare Other | Admitting: Family Medicine

## 2023-10-27 VITALS — BP 129/75 | HR 67 | Temp 97.3°F | Ht <= 58 in | Wt 135.0 lb

## 2023-10-27 DIAGNOSIS — D500 Iron deficiency anemia secondary to blood loss (chronic): Secondary | ICD-10-CM

## 2023-10-27 DIAGNOSIS — E7800 Pure hypercholesterolemia, unspecified: Secondary | ICD-10-CM

## 2023-10-27 DIAGNOSIS — Z23 Encounter for immunization: Secondary | ICD-10-CM | POA: Diagnosis not present

## 2023-10-27 DIAGNOSIS — M810 Age-related osteoporosis without current pathological fracture: Secondary | ICD-10-CM

## 2023-10-27 DIAGNOSIS — I10 Essential (primary) hypertension: Secondary | ICD-10-CM | POA: Diagnosis not present

## 2023-10-27 DIAGNOSIS — K219 Gastro-esophageal reflux disease without esophagitis: Secondary | ICD-10-CM

## 2023-10-27 DIAGNOSIS — R600 Localized edema: Secondary | ICD-10-CM | POA: Diagnosis not present

## 2023-10-27 DIAGNOSIS — Z79899 Other long term (current) drug therapy: Secondary | ICD-10-CM | POA: Diagnosis not present

## 2023-10-27 LAB — IBC + FERRITIN
Ferritin: 68.2 ng/mL (ref 10.0–291.0)
Iron: 58 ug/dL (ref 42–145)
Saturation Ratios: 16.3 % — ABNORMAL LOW (ref 20.0–50.0)
TIBC: 355.6 ug/dL (ref 250.0–450.0)
Transferrin: 254 mg/dL (ref 212.0–360.0)

## 2023-10-27 LAB — COMPREHENSIVE METABOLIC PANEL
ALT: 12 U/L (ref 0–35)
AST: 14 U/L (ref 0–37)
Albumin: 4.2 g/dL (ref 3.5–5.2)
Alkaline Phosphatase: 63 U/L (ref 39–117)
BUN: 23 mg/dL (ref 6–23)
CO2: 29 meq/L (ref 19–32)
Calcium: 9.2 mg/dL (ref 8.4–10.5)
Chloride: 91 meq/L — ABNORMAL LOW (ref 96–112)
Creatinine, Ser: 1.11 mg/dL (ref 0.40–1.20)
GFR: 45.46 mL/min — ABNORMAL LOW (ref >60.00)
Glucose, Bld: 88 mg/dL (ref 70–99)
Potassium: 4.9 meq/L (ref 3.5–5.1)
Sodium: 129 meq/L — ABNORMAL LOW (ref 135–145)
Total Bilirubin: 0.5 mg/dL (ref 0.2–1.2)
Total Protein: 6.6 g/dL (ref 6.0–8.3)

## 2023-10-27 LAB — LIPID PANEL
Cholesterol: 159 mg/dL (ref 0–200)
HDL: 77 mg/dL (ref >39.00)
LDL Cholesterol: 69 mg/dL (ref 0–99)
NonHDL: 82.14
Total CHOL/HDL Ratio: 2
Triglycerides: 65 mg/dL (ref 0.0–149.0)
VLDL: 13 mg/dL (ref 0.0–40.0)

## 2023-10-27 LAB — CBC WITH DIFFERENTIAL/PLATELET
Basophils Absolute: 0 10*3/uL (ref 0.0–0.1)
Basophils Relative: 0.6 % (ref 0.0–3.0)
Eosinophils Absolute: 0.1 10*3/uL (ref 0.0–0.7)
Eosinophils Relative: 0.8 % (ref 0.0–5.0)
HCT: 33.4 % — ABNORMAL LOW (ref 36.0–46.0)
Hemoglobin: 11.1 g/dL — ABNORMAL LOW (ref 12.0–15.0)
Lymphocytes Relative: 15.2 % (ref 12.0–46.0)
Lymphs Abs: 1.1 10*3/uL (ref 0.7–4.0)
MCHC: 33.1 g/dL (ref 30.0–36.0)
MCV: 92.1 fL (ref 78.0–100.0)
Monocytes Absolute: 0.8 10*3/uL (ref 0.1–1.0)
Monocytes Relative: 10.5 % (ref 3.0–12.0)
Neutro Abs: 5.3 10*3/uL (ref 1.4–7.7)
Neutrophils Relative %: 72.9 % (ref 43.0–77.0)
Platelets: 327 10*3/uL (ref 150.0–400.0)
RBC: 3.63 Mil/uL — ABNORMAL LOW (ref 3.87–5.11)
RDW: 14.5 % (ref 11.5–15.5)
WBC: 7.3 10*3/uL (ref 4.0–10.5)

## 2023-10-27 LAB — VITAMIN D 25 HYDROXY (VIT D DEFICIENCY, FRACTURES): VITD: 47.88 ng/mL (ref 30.00–100.00)

## 2023-10-27 LAB — MAGNESIUM: Magnesium: 1.4 mg/dL — ABNORMAL LOW (ref 1.5–2.5)

## 2023-10-27 NOTE — Assessment & Plan Note (Signed)
 Chronic.  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  Continue meds.

## 2023-10-27 NOTE — Patient Instructions (Signed)
 It was very nice to see you today!  Shingrix Tdap   PLEASE NOTE:  If you had any lab tests please let us  know if you have not heard back within a few days. You may see your results on MyChart before we have a chance to review them but we will give you a call once they are reviewed by us . If we ordered any referrals today, please let us  know if you have not heard from their office within the next week.   Please try these tips to maintain a healthy lifestyle:  Eat most of your calories during the day when you are active. Eliminate processed foods including packaged sweets (pies, cakes, cookies), reduce intake of potatoes, white bread, white pasta, and white rice. Look for whole grain options, oat flour or almond flour.  Each meal should contain half fruits/vegetables, one quarter protein, and one quarter carbs (no bigger than a computer mouse).  Cut down on sweet beverages. This includes juice, soda, and sweet tea. Also watch fruit intake, though this is a healthier sweet option, it still contains natural sugar! Limit to 3 servings daily.  Drink at least 1 glass of water with each meal and aim for at least 8 glasses per day  Exercise at least 150 minutes every week.

## 2023-10-27 NOTE — Assessment & Plan Note (Signed)
 Controlled.  Continue carvedilol 25 mg twice daily and amlodipine 2.5mg  daily

## 2023-10-27 NOTE — Assessment & Plan Note (Signed)
 Chronic.  Controlled.  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  Continue Prevacid 30 mg daily

## 2023-10-27 NOTE — Assessment & Plan Note (Signed)
 Check labs

## 2023-10-27 NOTE — Assessment & Plan Note (Signed)
 Chronic.  Controlled on furosemide 40 mg daily.  Managed by nephrology and cardiology

## 2023-10-27 NOTE — Progress Notes (Signed)
 Subjective:    Patient ID: Julie Lutz, female    DOB: 1940-09-22, 83 y.o.   MRN: [id number]  Chief Complaint  Patient presents with   3 month follow-up  Pt is accompanied by her daughter.   HPI HTN - Pt is on Carvedilol  25 mg BID and Amlodipine  2.5 mg daily. Bp's running 130-140/60s . No ha/dizziness/cp/palp/cough/sob. Improvement wit her swelling.    Low Sodium - Pt is on no Na.. Daughter denies dizziness or confusion.  Saw nephrology-  Diagnosed with SIADH and increased Lasix . Edema-chronic.  Furosemide  40 mg daily.  Doing better.  Managed by nephrology/cardiology.   Insomnia - Chronic. Xanax  0.5 mg and mirtazapine  7.5 mg daily hs. No SI.  States she needs both and not having any side effects and not wanting to change.  Has been doing this for years  HLD - Complaint with Lipitor 10 mg daily, responding well.  Lo Mg-taking OTC GERD-doing well on prevacid .  No new dysphagia.  H/o laryngeal ca Asthma-occ inhaler.  Clearing throat more. Epidural for chronic back pain Osteoporosis-managed by endo  There are no preventive care reminders to display for this patient.   Past Medical History:  Diagnosis Date   Allergy    Arthritis    Asthma    esosinophillic   Cancer (HCC)    vocal cord   Cataract    GERD (gastroesophageal reflux disease)    Hyperlipidemia    Hypertension    Hyponatremia    Osteoporosis    Spinal stenosis     Past Surgical History:  Procedure Laterality Date   FEMUR FRACTURE SURGERY Right 08/2016   HERNIA REPAIR Right    inguinal   JOINT REPLACEMENT  2017   bilateral knee   LAMINECTOMY  11/2020   L3-5   LARYNGOSCOPY  11/2021   SPINAL FUSION  03/2021     Current Outpatient Medications:    albuterol  (PROVENTIL  HFA) 108 (90 Base) MCG/ACT inhaler, Inhale 1-2 puffs into the lungs every 6 (six) hours as needed for wheezing or shortness of breath., Disp: , Rfl:    albuterol  (PROVENTIL ) (2.5 MG/3ML) 0.083% nebulizer solution, Take 3 mLs (2.5 mg total)  by nebulization every 6 (six) hours as needed for wheezing or shortness of breath., Disp: 75 mL, Rfl: 12   ALPRAZolam  (XANAX ) 0.5 MG tablet, Take 1 tablet (0.5 mg total) by mouth at bedtime as needed for anxiety., Disp: 90 tablet, Rfl: 1   amLODipine  (NORVASC ) 2.5 MG tablet, Take 1 tablet (2.5 mg total) by mouth daily., Disp: 90 tablet, Rfl: 3   atorvastatin  (LIPITOR) 10 MG tablet, Take 1 tablet (10 mg total) by mouth daily., Disp: 90 tablet, Rfl: 3   azelastine  (ASTELIN ) 0.1 % nasal spray, Place 2 sprays into both nostrils 2 (two) times daily., Disp: 30 mL, Rfl: 12   carvedilol  (COREG ) 25 MG tablet, Take 1 tablet (25 mg total) by mouth 2 (two) times daily with a meal., Disp: 180 tablet, Rfl: 3   Coenzyme Q10 (COQ-10 PO), Take by mouth., Disp: , Rfl:    Cyanocobalamin  (VITAMIN B 12 PO), Take by mouth., Disp: , Rfl:    FASENRA  PEN 30 MG/ML prefilled autoinjector, INJECT 1 PEN UNDER THE SKIN EVERY 8 WEEKS, Disp: 1 mL, Rfl: 3   Ferrous Sulfate (IRON PO), Take by mouth every other day., Disp: , Rfl:    fluticasone -salmeterol (ADVAIR) 250-50 MCG/ACT AEPB, Inhale 1 puff into the lungs in the morning and at bedtime., Disp: 180 each, Rfl: 3  furosemide  (LASIX ) 40 MG tablet, ONE BY MOUTH DAILY, Disp: , Rfl:    Magnesium  Oxide (MAG-OXIDE PO), Take by mouth daily., Disp: , Rfl:    mirabegron  ER (MYRBETRIQ ) 50 MG TB24 tablet, Take 1 tablet (50 mg total) by mouth daily., Disp: 90 tablet, Rfl: 3   mirtazapine  (REMERON ) 7.5 MG tablet, Take 1 tablet (7.5 mg total) by mouth at bedtime., Disp: 90 tablet, Rfl: 3   ondansetron  (ZOFRAN -ODT) 4 MG disintegrating tablet, Take 1 tablet (4 mg total) by mouth every 8 (eight) hours as needed., Disp: 20 tablet, Rfl: 0   PREVACID  30 MG capsule, Take 1 capsule (30 mg total) by mouth daily at 12 noon., Disp: 90 capsule, Rfl: 3   Tiotropium Bromide  Monohydrate (SPIRIVA  RESPIMAT) 1.25 MCG/ACT AERS, Inhale 2 puffs into the lungs daily., Disp: , Rfl:    Vitamin D , Ergocalciferol , 50  MCG (2000 UT) CAPS, Take by mouth daily at 6 (six) AM., Disp: , Rfl:   No Known Allergies ROS neg/noncontributory except as noted HPI/below      Objective:     BP 129/75   Pulse 67   Temp (!) 97.3 F (36.3 C) (Temporal)   Ht 4' 9 (1.448 m)   Wt 135 lb (61.2 kg) Comment: patient reported, declined weight check  SpO2 98%   BMI 29.21 kg/m  Wt Readings from Last 3 Encounters:  10/27/23 135 lb (61.2 kg)  08/11/23 139 lb 6.4 oz (63.2 kg)  07/27/23 140 lb 4 oz (63.6 kg)   Physical Exam   Gen: WDWN NAD HEENT: NCAT, conjunctiva not injected, sclera nonicteric NECK:  supple, no thyromegaly, no nodes, no carotid bruits  firm neck from surgery CARDIAC: RRR, S1S2+, no murmur. DP 2+B LUNGS: CTAB. No wheezes chronic.   ABDOMEN:  BS+, soft, NTND, No HSM, no masses EXT:  no edema MSK: cane.  NEURO: A&O x3.  CN II-XII intact.  PSYCH: normal mood. Good eye contact     Assessment & Plan:  Primary hypertension Assessment & Plan: Controlled.  Continue carvedilol  25 mg twice daily and amlodipine  2.5mg  daily   Pure hypercholesterolemia Assessment & Plan: Chronic.  Controlled.  Continue atorvastatin  10 mg daily  Orders: -     Lipid panel  Hypomagnesemia Assessment & Plan: Chronic.  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  Continue meds.  Orders: -     Magnesium   Local edema Assessment & Plan: Chronic.  Controlled on furosemide  40 mg daily.  Managed by nephrology and cardiology   Gastroesophageal reflux disease without esophagitis Assessment & Plan: Chronic.  Controlled.  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  Continue Prevacid  30 mg daily   Iron deficiency anemia due to chronic blood loss Assessment & Plan: Prob anemia of chronic dz.  Monitor labs  Orders: -     IBC + Ferritin  Age-related osteoporosis without current pathological fracture Assessment & Plan: Chronic.  Managed by endo  Orders: -     VITAMIN D  25 Hydroxy (Vit-D  Deficiency, Fractures)  High risk medication use Assessment & Plan: Check labs  Orders: -     CBC with Differential/Platelet -     Comprehensive metabolic panel -     IBC + Ferritin  Need for pneumococcal vaccination -     Pneumococcal conjugate vaccine 20-valent     Return in about 3 months (around 01/24/2024) for chronic follow-up. SABRA Jenkins CHRISTELLA Wendolyn, MD

## 2023-10-27 NOTE — Progress Notes (Signed)
 Magnesium  a little low-make sure taking it.  Sodium a little low for her-have her f/u w/neph  Repeat bmp Mg in 1 wk

## 2023-10-27 NOTE — Assessment & Plan Note (Signed)
 Prob anemia of chronic dz.  Monitor labs

## 2023-10-27 NOTE — Assessment & Plan Note (Signed)
Chronic Managed by endo 

## 2023-10-27 NOTE — Assessment & Plan Note (Signed)
Chronic Controlled Continue atorvastatin 10 mg daily.

## 2023-10-28 NOTE — Telephone Encounter (Signed)
 Results faxed to Dr. Christianne Cowper.

## 2023-11-02 ENCOUNTER — Ambulatory Visit: Payer: Medicare Other | Attending: Physician Assistant | Admitting: Physician Assistant

## 2023-11-02 ENCOUNTER — Encounter: Payer: Self-pay | Admitting: Physician Assistant

## 2023-11-02 VITALS — BP 149/82 | HR 64 | Resp 12 | Ht <= 58 in | Wt 143.2 lb

## 2023-11-02 DIAGNOSIS — K219 Gastro-esophageal reflux disease without esophagitis: Secondary | ICD-10-CM

## 2023-11-02 DIAGNOSIS — I10 Essential (primary) hypertension: Secondary | ICD-10-CM | POA: Diagnosis present

## 2023-11-02 DIAGNOSIS — F5101 Primary insomnia: Secondary | ICD-10-CM | POA: Diagnosis present

## 2023-11-02 DIAGNOSIS — M19042 Primary osteoarthritis, left hand: Secondary | ICD-10-CM | POA: Diagnosis present

## 2023-11-02 DIAGNOSIS — M19041 Primary osteoarthritis, right hand: Secondary | ICD-10-CM

## 2023-11-02 DIAGNOSIS — R29890 Loss of height: Secondary | ICD-10-CM | POA: Diagnosis present

## 2023-11-02 DIAGNOSIS — Z8619 Personal history of other infectious and parasitic diseases: Secondary | ICD-10-CM | POA: Diagnosis present

## 2023-11-02 DIAGNOSIS — Z87891 Personal history of nicotine dependence: Secondary | ICD-10-CM | POA: Diagnosis present

## 2023-11-02 DIAGNOSIS — C320 Malignant neoplasm of glottis: Secondary | ICD-10-CM | POA: Diagnosis present

## 2023-11-02 DIAGNOSIS — M48062 Spinal stenosis, lumbar region with neurogenic claudication: Secondary | ICD-10-CM | POA: Diagnosis present

## 2023-11-02 DIAGNOSIS — Z96653 Presence of artificial knee joint, bilateral: Secondary | ICD-10-CM

## 2023-11-02 DIAGNOSIS — J8283 Eosinophilic asthma: Secondary | ICD-10-CM

## 2023-11-02 DIAGNOSIS — N3281 Overactive bladder: Secondary | ICD-10-CM

## 2023-11-02 DIAGNOSIS — M810 Age-related osteoporosis without current pathological fracture: Secondary | ICD-10-CM | POA: Diagnosis present

## 2023-11-02 DIAGNOSIS — M40294 Other kyphosis, thoracic region: Secondary | ICD-10-CM | POA: Diagnosis present

## 2023-11-02 NOTE — Patient Instructions (Signed)
DEXA due in Fall 2025

## 2023-11-03 ENCOUNTER — Other Ambulatory Visit (HOSPITAL_COMMUNITY): Payer: Self-pay

## 2023-11-03 ENCOUNTER — Telehealth: Payer: Self-pay | Admitting: Internal Medicine

## 2023-11-03 ENCOUNTER — Telehealth: Payer: Self-pay | Admitting: Pharmacy Technician

## 2023-11-03 ENCOUNTER — Encounter: Payer: Self-pay | Admitting: Rheumatology

## 2023-11-03 ENCOUNTER — Other Ambulatory Visit: Payer: Self-pay | Admitting: Pharmacist

## 2023-11-03 ENCOUNTER — Telehealth: Payer: Self-pay

## 2023-11-03 NOTE — Telephone Encounter (Signed)
Julie Lutz, Patient will scheduled as soon as possible.   Auth Submission: NO AUTH NEEDED Site of care: Site of care: [facility] Payer: MEDICARE A/B & SUPP Medication & CPT/J Code(s) submitted: Reclast (Zolendronic acid) M8413 Route of submission (phone, fax, portal):  Phone # Fax # Auth type: Buy/Bill PB Units/visits requested: 1 DOSE Reference number:  Approval from: 11/07/23 to 09/19/24

## 2023-11-03 NOTE — Telephone Encounter (Signed)
Patient contacted the office and states she had called [place] about her Fasenra medication. Patient states she contacted the wrong office and to ignore the call.

## 2023-11-03 NOTE — Telephone Encounter (Signed)
Pls call in [place] ([place]? [place] [facility]  Her # is [phone]

## 2023-11-03 NOTE — Progress Notes (Signed)
Referral placed to [facility] for Reclast  Last infusion 11/16/2022   Dose: 5mg  IV every 12 months   Premedications: acetaminophen 650mg  PO and diphenhydramine 25 mg PO   Chesley Mires, PharmD, MPH, BCPS, CPP Clinical Pharmacist (Rheumatology and Pulmonology)

## 2023-11-03 NOTE — Telephone Encounter (Signed)
Received notification from [facility] regarding a prior authorization for [facility]. Authorization has been APPROVED from 11/03/2023 to 11/02/2024. Approval letter sent to scan center.  Patient can continue to fill through [facility]: [phone]  Authorization # [id number]  Left VM regarding approval and advised her to call [facility]  Chesley Mires, PharmD, MPH, BCPS, CPP Clinical Pharmacist (Rheumatology and Pulmonology)

## 2023-11-03 NOTE — Telephone Encounter (Signed)
Plase be advised refill was sent on 10/25/2023- can look at patient's med list and see that Harrington Challenger was sent on 10/25/2023 to [facility].   Patient needs a PRIOR AUTHORIZATION not refill. Called patient and she states she never said she needed refill  Submitted an URGENT Prior Authorization request to [facility] for [facility] via CoverMyMeds. Will update once we receive a response.  Key: [id number]   Chesley Mires, PharmD, MPH, BCPS, CPP Clinical Pharmacist (Rheumatology and Pulmonology)

## 2023-11-04 NOTE — Progress Notes (Signed)
Reclast scheduled for 11/30/23

## 2023-11-08 ENCOUNTER — Other Ambulatory Visit (INDEPENDENT_AMBULATORY_CARE_PROVIDER_SITE_OTHER): Payer: Medicare Other

## 2023-11-08 ENCOUNTER — Other Ambulatory Visit: Payer: Self-pay | Admitting: *Deleted

## 2023-11-08 DIAGNOSIS — I10 Essential (primary) hypertension: Secondary | ICD-10-CM

## 2023-11-08 DIAGNOSIS — R790 Abnormal level of blood mineral: Secondary | ICD-10-CM

## 2023-11-08 NOTE — Addendum Note (Signed)
Addended by: Jobe Gibbon on: 11/08/2023 03:45 PM   Modules accepted: Orders

## 2023-11-09 ENCOUNTER — Ambulatory Visit: Payer: Medicare Other

## 2023-11-09 ENCOUNTER — Encounter: Payer: Self-pay | Admitting: Family Medicine

## 2023-11-09 LAB — BASIC METABOLIC PANEL
BUN/Creatinine Ratio: 18 (calc) (ref 6–22)
BUN: 20 mg/dL (ref 7–25)
CO2: 26 mmol/L (ref 20–32)
Calcium: 9.5 mg/dL (ref 8.6–10.4)
Chloride: 93 mmol/L — ABNORMAL LOW (ref 98–110)
Creat: 1.09 mg/dL — ABNORMAL HIGH (ref 0.60–0.95)
Glucose, Bld: 99 mg/dL (ref 65–99)
Potassium: 4.5 mmol/L (ref 3.5–5.3)
Sodium: 131 mmol/L — ABNORMAL LOW (ref 135–146)

## 2023-11-09 LAB — MAGNESIUM: Magnesium: 1.6 mg/dL (ref 1.5–2.5)

## 2023-11-09 NOTE — Progress Notes (Signed)
 Stable

## 2023-11-30 ENCOUNTER — Ambulatory Visit: Payer: Medicare Other

## 2023-11-30 VITALS — BP 113/67 | HR 65 | Temp 97.6°F | Resp 16 | Ht <= 58 in | Wt 139.4 lb

## 2023-11-30 DIAGNOSIS — M810 Age-related osteoporosis without current pathological fracture: Secondary | ICD-10-CM | POA: Diagnosis not present

## 2023-11-30 MED ORDER — DIPHENHYDRAMINE HCL 25 MG PO CAPS
25.0000 mg | ORAL_CAPSULE | Freq: Once | ORAL | Status: AC
  Administered 2023-11-30: 25 mg via ORAL
  Filled 2023-11-30: qty 1

## 2023-11-30 MED ORDER — ACETAMINOPHEN 325 MG PO TABS
650.0000 mg | ORAL_TABLET | Freq: Once | ORAL | Status: AC
Start: 2023-11-30 — End: 2023-11-30
  Administered 2023-11-30: 650 mg via ORAL
  Filled 2023-11-30: qty 2

## 2023-11-30 MED ORDER — ZOLEDRONIC ACID 5 MG/100ML IV SOLN
5.0000 mg | Freq: Once | INTRAVENOUS | Status: AC
Start: 2023-11-30 — End: 2023-11-30
  Administered 2023-11-30: 5 mg via INTRAVENOUS
  Filled 2023-11-30: qty 100

## 2023-11-30 NOTE — Progress Notes (Addendum)
 Diagnosis: Osteoporosis  Provider:  Chilton Greathouse MD  Procedure: IV Infusion  IV Type: Peripheral, IV Location: L Antecubital  Reclast (Zolendronic Acid), Dose: 5 mg  Infusion Start Time: 1141  Infusion Stop Time: 1209  Post Infusion IV Care: Peripheral IV Discontinued  Discharge: Condition: Good, Destination: Home . AVS Declined  Performed by:  Rico Ala, LPN

## 2023-11-30 NOTE — Addendum Note (Signed)
 Addended by: Salli Real R on: 11/30/2023 12:22 PM   Modules accepted: Orders

## 2023-12-01 LAB — HM MAMMOGRAPHY

## 2023-12-02 ENCOUNTER — Encounter: Payer: Self-pay | Admitting: Family Medicine

## 2023-12-04 ENCOUNTER — Encounter: Payer: Self-pay | Admitting: Family Medicine

## 2024-01-01 ENCOUNTER — Ambulatory Visit
Admission: RE | Admit: 2024-01-01 | Discharge: 2024-01-01 | Disposition: A | Discharge status: Home / Self Care | Source: Ambulatory Visit | Attending: Family Medicine | Admitting: Family Medicine

## 2024-01-01 VITALS — BP 110/71 | HR 68 | Temp 97.4°F | Resp 17

## 2024-01-01 DIAGNOSIS — J209 Acute bronchitis, unspecified: Secondary | ICD-10-CM | POA: Diagnosis not present

## 2024-01-01 LAB — POC COVID19/FLU A&B COMBO
Covid Antigen, POC: NEGATIVE
Influenza A Antigen, POC: NEGATIVE
Influenza B Antigen, POC: NEGATIVE

## 2024-01-01 MED ORDER — PREDNISONE 10 MG (21) PO TBPK: ORAL_TABLET | ORAL | 0 refills | Status: DC

## 2024-01-01 MED ORDER — BENZONATATE 100 MG PO CAPS: 100.0000 mg | ORAL_CAPSULE | Freq: Three times a day (TID) | ORAL | 0 refills | Status: DC

## 2024-01-01 NOTE — ED Triage Notes (Signed)
 Pt c/o cough, fatigue, chest discomfort, and wheezing since Friday

## 2024-01-01 NOTE — ED Provider Notes (Signed)
 [facility]    CSN: [id number] Arrival date & time: 01/01/24  1100      History   Chief Complaint Chief Complaint  Patient presents with   Wheezing    Possible bronchitis chest sore. Very tired. Coughing dark yellow mucous - Entered by patient    HPI Julie Lutz is a 83 y.o. female.   The history is provided by the patient.  Wheezing Chest congestion, cough, wheezing, increased need for use of her inhaler onset 2 days ago.  Admits to fatigue and achiness, admits rhinorrhea.  Her chest hurts when she coughs.  Has nausea today.  Denies headache, documented fever, chills, sweats, vomiting, diarrhea.  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   Local edema 07/28/2023   Hyponatremia 04/27/2023   Hypomagnesemia 04/27/2023   Pure hypercholesterolemia 11/23/2022   Age-related osteoporosis without current pathological fracture 11/03/2022   High risk medication use 10/26/2022   Absolute anemia 10/26/2022   Primary hypertension 05/25/2022   Gastroesophageal reflux disease without esophagitis 05/25/2022   Localized osteoporosis without current pathological fracture 05/25/2022   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      Home Medications    Prior to Admission medications   Medication Sig Start Date End Date Taking? Authorizing Provider  albuterol (PROVENTIL HFA) 108 (90 Base) MCG/ACT inhaler Inhale 1-2 puffs into the lungs every 6 (six) hours as needed for wheezing or shortness of breath.    [provider]  albuterol (PROVENTIL) (2.5 MG/3ML) 0.083% nebulizer solution Take 3 mLs (2.5 mg total) by nebulization every 6 (six) hours as needed for wheezing or shortness of breath. 07/30/22   Desai, Nikita S, MD  ALPRAZolam (XANAX) 0.5 MG tablet Take 1 tablet (0.5 mg total) by mouth at bedtime as needed for anxiety. 07/27/23   Christel Cousins, MD  amLODipine (NORVASC) 2.5 MG tablet Take 1 tablet (2.5 mg total) by mouth daily. 03/30/23   Christel Cousins, MD  atorvastatin (LIPITOR) 10 MG tablet Take 1 tablet (10 mg total) by mouth daily. 02/03/23   Sonny Dust, MD  azelastine (ASTELIN) 0.1 % nasal spray Place 2 sprays into both nostrils 2 (two) times daily. Patient not taking: Reported on 11/02/2023 11/23/22   Christel Cousins, MD  carvedilol (COREG) 25 MG tablet Take 1 tablet (25 mg total) by mouth 2 (two) times daily with a meal. 04/27/23   Christel Cousins, MD  Coenzyme Q10 (COQ-10 PO) Take by  mouth.    [provider]  Cyanocobalamin (VITAMIN B 12 PO) Take by mouth.    [provider]  FASENRA PEN 30 MG/ML prefilled autoinjector INJECT 1 PEN UNDER THE SKIN EVERY 8 WEEKS 10/25/23   Desai, Nikita S, MD  Ferrous Sulfate (IRON PO) Take by mouth every other day. Patient not taking: Reported on 11/02/2023    [provider]  fluticasone-salmeterol (ADVAIR) 250-50 MCG/ACT AEPB Inhale 1 puff into the lungs in the morning and at bedtime. 10/10/23   Desai, Nikita S, MD  furosemide (LASIX) 40 MG tablet ONE BY MOUTH DAILY    [provider]  Magnesium Oxide (MAG-OXIDE PO) Take by  mouth daily.    [provider]  mirabegron ER (MYRBETRIQ) 50 MG TB24 tablet Take 1 tablet (50 mg total) by mouth daily. 04/27/23   Christel Cousins, MD  mirtazapine (REMERON) 7.5 MG tablet Take 1 tablet (7.5 mg total) by mouth at bedtime. 03/30/23   Christel Cousins, MD  ondansetron (ZOFRAN-ODT) 4 MG disintegrating tablet Take 1 tablet (4 mg total) by mouth every 8 (eight) hours as needed. Patient not taking: Reported on 11/02/2023 04/06/23   Small, Brooke L, PA  PREVACID 30 MG capsule Take 1 capsule (30 mg total) by mouth daily at 12 noon. 07/27/23   Christel Cousins, MD  Tiotropium Bromide Monohydrate (SPIRIVA RESPIMAT) 1.25 MCG/ACT AERS Inhale 2 puffs into the lungs daily. 10/26/19   [provider]  Vitamin D, Ergocalciferol, 50 MCG (2000 UT) CAPS Take by mouth daily at 6 (six) AM.    [provider]    Family History Family History  Problem Relation Age of Onset   Diabetes Sister    Cancer Sister 52       ovarian   Anemia Sister    Allergies Daughter    Asthma Neg Hx     Social History Social History   Tobacco Use   Smoking status: Former    Current packs/day: 0.00    Types: Cigarettes    Quit date: 1990    Years since quitting: 35.3    Passive exposure: Never   Smokeless tobacco: Never  Vaping Use   Vaping status: Never Used  Substance Use Topics   Alcohol use: Yes    Alcohol/week: 5.0 standard drinks of alcohol    Types: 5 Glasses of wine per week    Comment: occasional   Drug use: Never     Allergies   Patient has no known allergies.   Review of Systems Review of Systems  Respiratory:  Positive for wheezing.      Physical Exam Triage Vital Signs ED Triage Vitals  Encounter Vitals Group     BP 01/01/24 1113 110/71     Systolic BP Percentile --      Diastolic BP Percentile --      Pulse Rate 01/01/24 1113 68     Resp 01/01/24 1113 17     Temp 01/01/24 1113 (!) 97.4 F (36.3 C)     Temp Source 01/01/24 1113 Oral     SpO2  01/01/24 1113 97 %     Weight --      Height --      Head Circumference --      Peak Flow --      Pain Score 01/01/24 1118 5     Pain Loc --      Pain Education --      Exclude from Growth Chart --  No data found.  Updated Vital Signs BP 110/71 (BP Location: Right Arm)   Pulse 68   Temp (!) 97.4 F (36.3 C) (Oral)   Resp 17   SpO2 97%   Visual Acuity Right Eye Distance:   Left Eye Distance:   Bilateral Distance:    Right Eye Near:   Left Eye Near:    Bilateral Near:     Physical Exam Vitals and nursing note reviewed.  Constitutional:      Appearance: She is not ill-appearing.  HENT:     Right Ear: Tympanic membrane and ear canal normal.     Left Ear: Tympanic membrane and ear canal normal.     Nose: No rhinorrhea.     Mouth/Throat:     Mouth: Mucous membranes are moist.     Pharynx: No oropharyngeal exudate or posterior oropharyngeal erythema.  Eyes:     General:        Right eye: No discharge.        Left eye: No discharge.     Conjunctiva/sclera: Conjunctivae normal.  Cardiovascular:     Rate and Rhythm: Normal rate and regular rhythm.     Heart sounds: Normal heart sounds.  Pulmonary:     Effort: Pulmonary effort is normal.     Breath sounds: Wheezing (Occasional end expiratory) present.  Musculoskeletal:     Cervical back: Neck supple.  Lymphadenopathy:     Cervical: No cervical adenopathy.  Skin:    General: Skin is warm and dry.  Neurological:     Mental Status: She is alert and oriented to person, place, and time.      UC Treatments / Results  Labs (all labs ordered are listed, but only abnormal results are displayed) Labs Reviewed  POC COVID19/FLU A&B COMBO    EKG   Radiology No results found.  Procedures Procedures (including critical care time)  Medications Ordered in UC Medications - No data to display  Initial Impression / Assessment and Plan / UC Course  I have reviewed the triage vital signs and the nursing  notes.  Pertinent labs & imaging results that were available during my care of the patient were reviewed by me and considered in my medical decision making (see chart for details).     83 year old with history of asthma presents with increased cough wheezing fatigue..  She is well-appearing, vitals mild end expiratory wheeze left base.  Point-of-care COVID and flu are negative will treat for asthma medicines, warning signs and follow-up reviewed Final Clinical Impressions(s) / UC Diagnoses   Final diagnoses:  None   Discharge Instructions   None    ED Prescriptions   None    PDMP not reviewed this encounter.   Brookelyn Gaynor, [place] 01/01/24 1149

## 2024-01-03 ENCOUNTER — Other Ambulatory Visit: Payer: Self-pay | Admitting: Family Medicine

## 2024-01-03 NOTE — Telephone Encounter (Signed)
 Copied from CRM [id number]. Topic: Clinical - Medication Refill >> Jan 03, 2024 10:34 AM Juliana Ocean wrote: Most Recent Primary Care Visit:  Provider: [facility]  Department: [facility]  Visit Type: LAB VISIT  Date: 11/08/2023  Medication:Tiotropium Bromide Monohydrate (SPIRIVA RESPIMAT) 1.25 MCG/ACT AERS  albuterol (PROVENTIL HFA) 108 (90 Base) MCG/ACT inhaler  Has the patient contacted their pharmacy? No (Agent: If no, request that the patient contact the pharmacy for the refill. If patient does not wish to contact the pharmacy document the reason why and proceed with request.) (Agent: If yes, when and what did the pharmacy advise?)  Is this the correct pharmacy for this prescription? Yes Ipt states the dr has never filled these meds for her.  She moved here from [place] , and had plenty when she made the move [facility] [place], [place] - [facility] AT Portal to Registered Caremark Sites [address] [place] [address] Phone: [phone] Fax: [phone]  has the prescription been filled recently? No  Is the patient out of the medication? Yes  Has the patient been seen for an appointment in the last year OR does the patient have an upcoming appointment? Yes  Can we respond through MyChart? Yes  90 day refills

## 2024-01-12 ENCOUNTER — Other Ambulatory Visit: Payer: Self-pay | Admitting: Internal Medicine

## 2024-01-12 MED ORDER — FLUTICASONE-SALMETEROL 250-50 MCG/ACT IN AEPB
1.0000 | INHALATION_SPRAY | Freq: Two times a day (BID) | RESPIRATORY_TRACT | 3 refills | Status: AC
Start: 2024-01-12 — End: ?

## 2024-01-12 NOTE — Telephone Encounter (Signed)
 Last Fill: Albuterol : 07/30/22     Advair: 10/10/23  Last OV: 08/11/23 Next OV: None Scheduled  Routing to provider for review/authorization.

## 2024-01-12 NOTE — Telephone Encounter (Signed)
 Copied from CRM [id number]. Topic: Clinical - Medication Refill >> Jan 12, 2024 10:51 AM Evie Hoff wrote: Most Recent Primary Care Visit:  Provider: [facility]  Department: [facility]  Visit Type: LAB VISIT  Date: 11/08/2023  Medication: albuterol  (PROVENTIL  HFA) 108 (90 Base) MCG/ACT inhaler fluticasone -salmeterol (ADVAIR) 250-50 MCG/ACT AEPB    Has the patient contacted their pharmacy? No it will be new from this doctor  (Agent: If no, request that the patient contact the pharmacy for the refill. If patient does not wish to contact the pharmacy document the reason why and proceed with request.) (Agent: If yes, when and what did the pharmacy advise?)  Is this the correct pharmacy for this prescription? Yes If no, delete pharmacy and type the correct one.  This is the patient's preferred pharmacy:  [facility] [place], [place] - [address] [facility] AT Portal to Registered Caremark Sites [address] [place] [address] Phone: [phone] Fax: [phone]  [id number] [address], [place] [address] Phone: [phone] Fax: [phone]  [facility] [place], [place] - [address] [place] [address] Phone: [phone] Fax: [phone]   Has the prescription been filled recently? No  Is the patient out of the medication? No but getting low for both   Has the patient been seen for an appointment in the last year OR does the patient have an upcoming appointment? Yes  Can we respond through MyChart? No  Agent: Please be advised that Rx refills may take up to 3 business days. We ask that you follow-up with your pharmacy.

## 2024-01-15 ENCOUNTER — Ambulatory Visit (INDEPENDENT_AMBULATORY_CARE_PROVIDER_SITE_OTHER): Admitting: Radiology

## 2024-01-15 ENCOUNTER — Other Ambulatory Visit: Payer: Self-pay

## 2024-01-15 ENCOUNTER — Encounter (HOSPITAL_BASED_OUTPATIENT_CLINIC_OR_DEPARTMENT_OTHER): Payer: Self-pay

## 2024-01-15 ENCOUNTER — Ambulatory Visit
Admission: RE | Admit: 2024-01-15 | Discharge: 2024-01-15 | Disposition: A | Discharge status: Home / Self Care | Payer: Self-pay | Source: Ambulatory Visit | Attending: Internal Medicine | Admitting: Internal Medicine

## 2024-01-15 ENCOUNTER — Inpatient Hospital Stay (HOSPITAL_BASED_OUTPATIENT_CLINIC_OR_DEPARTMENT_OTHER)
Admission: EM | Admit: 2024-01-15 | Discharge: 2024-01-19 | DRG: 643 | Disposition: A | Discharge status: Home / Self Care | Attending: Internal Medicine | Admitting: Internal Medicine

## 2024-01-15 ENCOUNTER — Other Ambulatory Visit (HOSPITAL_BASED_OUTPATIENT_CLINIC_OR_DEPARTMENT_OTHER): Admitting: Radiology

## 2024-01-15 VITALS — BP 124/68 | HR 84 | Temp 98.9°F | Resp 22

## 2024-01-15 DIAGNOSIS — R051 Acute cough: Secondary | ICD-10-CM

## 2024-01-15 DIAGNOSIS — T501X5A Adverse effect of loop [high-ceiling] diuretics, initial encounter: Secondary | ICD-10-CM | POA: Diagnosis present

## 2024-01-15 DIAGNOSIS — E785 Hyperlipidemia, unspecified: Secondary | ICD-10-CM | POA: Insufficient documentation

## 2024-01-15 DIAGNOSIS — R0602 Shortness of breath: Secondary | ICD-10-CM

## 2024-01-15 DIAGNOSIS — Z7951 Long term (current) use of inhaled steroids: Secondary | ICD-10-CM

## 2024-01-15 DIAGNOSIS — N179 Acute kidney failure, unspecified: Secondary | ICD-10-CM | POA: Insufficient documentation

## 2024-01-15 DIAGNOSIS — N3000 Acute cystitis without hematuria: Secondary | ICD-10-CM | POA: Diagnosis present

## 2024-01-15 DIAGNOSIS — J1282 Pneumonia due to coronavirus disease 2019: Secondary | ICD-10-CM | POA: Diagnosis present

## 2024-01-15 DIAGNOSIS — Z79899 Other long term (current) drug therapy: Secondary | ICD-10-CM | POA: Diagnosis not present

## 2024-01-15 DIAGNOSIS — R490 Dysphonia: Secondary | ICD-10-CM | POA: Diagnosis present

## 2024-01-15 DIAGNOSIS — E7849 Other hyperlipidemia: Secondary | ICD-10-CM

## 2024-01-15 DIAGNOSIS — Z923 Personal history of irradiation: Secondary | ICD-10-CM

## 2024-01-15 DIAGNOSIS — E876 Hypokalemia: Secondary | ICD-10-CM | POA: Diagnosis not present

## 2024-01-15 DIAGNOSIS — Z87891 Personal history of nicotine dependence: Secondary | ICD-10-CM | POA: Diagnosis not present

## 2024-01-15 DIAGNOSIS — E222 Syndrome of inappropriate secretion of antidiuretic hormone: Secondary | ICD-10-CM | POA: Diagnosis present

## 2024-01-15 DIAGNOSIS — R7303 Prediabetes: Secondary | ICD-10-CM | POA: Diagnosis present

## 2024-01-15 DIAGNOSIS — K219 Gastro-esophageal reflux disease without esophagitis: Secondary | ICD-10-CM | POA: Diagnosis present

## 2024-01-15 DIAGNOSIS — U071 COVID-19: Secondary | ICD-10-CM

## 2024-01-15 DIAGNOSIS — I10 Essential (primary) hypertension: Secondary | ICD-10-CM | POA: Diagnosis present

## 2024-01-15 DIAGNOSIS — Z8521 Personal history of malignant neoplasm of larynx: Secondary | ICD-10-CM | POA: Diagnosis not present

## 2024-01-15 DIAGNOSIS — R739 Hyperglycemia, unspecified: Secondary | ICD-10-CM | POA: Insufficient documentation

## 2024-01-15 DIAGNOSIS — T380X5A Adverse effect of glucocorticoids and synthetic analogues, initial encounter: Secondary | ICD-10-CM | POA: Diagnosis present

## 2024-01-15 DIAGNOSIS — Z981 Arthrodesis status: Secondary | ICD-10-CM | POA: Diagnosis not present

## 2024-01-15 DIAGNOSIS — J45901 Unspecified asthma with (acute) exacerbation: Secondary | ICD-10-CM | POA: Diagnosis present

## 2024-01-15 DIAGNOSIS — J4541 Moderate persistent asthma with (acute) exacerbation: Secondary | ICD-10-CM

## 2024-01-15 DIAGNOSIS — Z96653 Presence of artificial knee joint, bilateral: Secondary | ICD-10-CM | POA: Diagnosis present

## 2024-01-15 DIAGNOSIS — J4520 Mild intermittent asthma, uncomplicated: Secondary | ICD-10-CM | POA: Diagnosis not present

## 2024-01-15 DIAGNOSIS — D649 Anemia, unspecified: Secondary | ICD-10-CM | POA: Diagnosis not present

## 2024-01-15 DIAGNOSIS — E871 Hypo-osmolality and hyponatremia: Secondary | ICD-10-CM | POA: Diagnosis present

## 2024-01-15 DIAGNOSIS — D509 Iron deficiency anemia, unspecified: Secondary | ICD-10-CM | POA: Diagnosis present

## 2024-01-15 DIAGNOSIS — J441 Chronic obstructive pulmonary disease with (acute) exacerbation: Secondary | ICD-10-CM | POA: Insufficient documentation

## 2024-01-15 DIAGNOSIS — J189 Pneumonia, unspecified organism: Secondary | ICD-10-CM

## 2024-01-15 DIAGNOSIS — J45909 Unspecified asthma, uncomplicated: Secondary | ICD-10-CM | POA: Diagnosis present

## 2024-01-15 DIAGNOSIS — C320 Malignant neoplasm of glottis: Secondary | ICD-10-CM | POA: Diagnosis present

## 2024-01-15 LAB — COMPREHENSIVE METABOLIC PANEL WITH GFR
ALT: 14 U/L (ref 0–44)
AST: 17 U/L (ref 15–41)
Albumin: 3.8 g/dL (ref 3.5–5.0)
Alkaline Phosphatase: 96 U/L (ref 38–126)
Anion gap: 14 (ref 5–15)
BUN: 12 mg/dL (ref 8–23)
CO2: 25 mmol/L (ref 22–32)
Calcium: 8.8 mg/dL — ABNORMAL LOW (ref 8.9–10.3)
Chloride: 83 mmol/L — ABNORMAL LOW (ref 98–111)
Creatinine, Ser: 1.18 mg/dL — ABNORMAL HIGH (ref 0.44–1.00)
GFR, Estimated: 45 mL/min — ABNORMAL LOW (ref >60)
Glucose, Bld: 108 mg/dL — ABNORMAL HIGH (ref 70–99)
Potassium: 4 mmol/L (ref 3.5–5.1)
Sodium: 122 mmol/L — ABNORMAL LOW (ref 135–145)
Total Bilirubin: 0.6 mg/dL (ref 0.0–1.2)
Total Protein: 6.7 g/dL (ref 6.5–8.1)

## 2024-01-15 LAB — URINALYSIS, ROUTINE W REFLEX MICROSCOPIC
Bilirubin Urine: NEGATIVE
Glucose, UA: NEGATIVE mg/dL
Ketones, ur: NEGATIVE mg/dL
Nitrite: POSITIVE — AB
Specific Gravity, Urine: 1.006 (ref 1.005–1.030)
WBC, UA: >50 WBC/hpf (ref 0–5)
pH: 7 (ref 5.0–8.0)

## 2024-01-15 LAB — RESPIRATORY PANEL BY PCR

## 2024-01-15 LAB — CBC WITH DIFFERENTIAL/PLATELET
Abs Immature Granulocytes: 0.07 10*3/uL (ref 0.00–0.07)
Basophils Absolute: 0 10*3/uL (ref 0.0–0.1)
Basophils Relative: 0 %
Eosinophils Absolute: 0 10*3/uL (ref 0.0–0.5)
Eosinophils Relative: 0 %
HCT: 32.7 % — ABNORMAL LOW (ref 36.0–46.0)
Hemoglobin: 11 g/dL — ABNORMAL LOW (ref 12.0–15.0)
Immature Granulocytes: 1 %
Lymphocytes Relative: 8 %
Lymphs Abs: 1 10*3/uL (ref 0.7–4.0)
MCH: 29 pg (ref 26.0–34.0)
MCHC: 33.6 g/dL (ref 30.0–36.0)
MCV: 86.3 fL (ref 80.0–100.0)
Monocytes Absolute: 1.1 10*3/uL — ABNORMAL HIGH (ref 0.1–1.0)
Monocytes Relative: 9 %
Neutro Abs: 10.8 10*3/uL — ABNORMAL HIGH (ref 1.7–7.7)
Neutrophils Relative %: 82 %
Platelets: 294 10*3/uL (ref 150–400)
RBC: 3.79 MIL/uL — ABNORMAL LOW (ref 3.87–5.11)
RDW: 13.2 % (ref 11.5–15.5)
WBC: 13 10*3/uL — ABNORMAL HIGH (ref 4.0–10.5)
nRBC: 0 % (ref 0.0–0.2)

## 2024-01-15 LAB — PHOSPHORUS: Phosphorus: 2.7 mg/dL (ref 2.5–4.6)

## 2024-01-15 LAB — OSMOLALITY, URINE: Osmolality, Ur: 160 mosm/kg — ABNORMAL LOW (ref 300–900)

## 2024-01-15 LAB — SODIUM, URINE, RANDOM: Sodium, Ur: 17 mmol/L

## 2024-01-15 LAB — MAGNESIUM: Magnesium: 0.8 mg/dL — CL (ref 1.7–2.4)

## 2024-01-15 LAB — OSMOLALITY: Osmolality: 259 mosm/kg — ABNORMAL LOW (ref 275–295)

## 2024-01-15 MED ORDER — SODIUM CHLORIDE 0.9 % IV SOLN
200.0000 mg | Freq: Once | INTRAVENOUS | Status: DC
  Filled 2024-01-15: qty 40

## 2024-01-15 MED ORDER — ATORVASTATIN CALCIUM 10 MG PO TABS: 10.0000 mg | ORAL_TABLET | Freq: Every day | ORAL | Status: DC

## 2024-01-15 MED ORDER — SODIUM CHLORIDE 0.9 % IV SOLN: 100.0000 mg | Freq: Every day | INTRAVENOUS | Status: DC

## 2024-01-15 MED ORDER — BENZONATATE 100 MG PO CAPS
100.0000 mg | ORAL_CAPSULE | Freq: Three times a day (TID) | ORAL | Status: DC
Start: 2024-01-15 — End: 2024-01-15

## 2024-01-15 MED ORDER — IPRATROPIUM-ALBUTEROL 0.5-2.5 (3) MG/3ML IN SOLN
3.0000 mL | Freq: Once | RESPIRATORY_TRACT | Status: AC
  Administered 2024-01-15: 3 mL via RESPIRATORY_TRACT

## 2024-01-15 MED ORDER — ACETAMINOPHEN 325 MG PO TABS
650.0000 mg | ORAL_TABLET | Freq: Four times a day (QID) | ORAL | Status: DC | PRN
  Administered 2024-01-15 – 2024-01-18 (×4): 650 mg via ORAL
  Filled 2024-01-15 (×5): qty 2

## 2024-01-15 MED ORDER — ALBUTEROL SULFATE HFA 108 (90 BASE) MCG/ACT IN AERS
2.0000 | INHALATION_SPRAY | RESPIRATORY_TRACT | Status: DC | PRN
Start: 2024-01-15 — End: 2024-01-15

## 2024-01-15 MED ORDER — FOLIC ACID 1 MG PO TABS
1.0000 mg | ORAL_TABLET | Freq: Every day | ORAL | Status: DC
  Administered 2024-01-16 – 2024-01-19 (×4): 1 mg via ORAL
  Filled 2024-01-15 (×4): qty 1

## 2024-01-15 MED ORDER — ONDANSETRON HCL 4 MG PO TABS: 4.0000 mg | ORAL_TABLET | Freq: Four times a day (QID) | ORAL | Status: DC | PRN

## 2024-01-15 MED ORDER — SODIUM CHLORIDE 0.9 % IV SOLN
100.0000 mg | Freq: Two times a day (BID) | INTRAVENOUS | Status: DC
  Administered 2024-01-16: 100 mg via INTRAVENOUS
  Filled 2024-01-15 (×2): qty 100

## 2024-01-15 MED ORDER — FLUTICASONE FUROATE-VILANTEROL 200-25 MCG/ACT IN AEPB
1.0000 | INHALATION_SPRAY | Freq: Every day | RESPIRATORY_TRACT | Status: DC
  Administered 2024-01-16 – 2024-01-19 (×4): 1 via RESPIRATORY_TRACT

## 2024-01-15 MED ORDER — NIRMATRELVIR/RITONAVIR (PAXLOVID) TABLET (RENAL DOSING)
2.0000 | ORAL_TABLET | Freq: Two times a day (BID) | ORAL | Status: DC
  Filled 2024-01-15: qty 20

## 2024-01-15 MED ORDER — ALPRAZOLAM 0.5 MG PO TABS
0.5000 mg | ORAL_TABLET | Freq: Every evening | ORAL | Status: DC | PRN
  Administered 2024-01-15 – 2024-01-18 (×4): 0.5 mg via ORAL
  Filled 2024-01-15 (×4): qty 1

## 2024-01-15 MED ORDER — SODIUM CHLORIDE 0.9 % IV SOLN
2.0000 g | INTRAVENOUS | Status: DC
  Administered 2024-01-16 (×2): 2 g via INTRAVENOUS
  Filled 2024-01-15 (×2): qty 20

## 2024-01-15 MED ORDER — MAGNESIUM SULFATE 2 GM/50ML IV SOLN
2.0000 g | Freq: Once | INTRAVENOUS | Status: AC
  Administered 2024-01-15: 2 g via INTRAVENOUS
  Filled 2024-01-15: qty 50

## 2024-01-15 MED ORDER — MAGNESIUM SULFATE 2 GM/50ML IV SOLN
2.0000 g | Freq: Once | INTRAVENOUS | Status: AC
Start: 2024-01-15 — End: 2024-01-16
  Administered 2024-01-15: 2 g via INTRAVENOUS
  Filled 2024-01-15: qty 50

## 2024-01-15 MED ORDER — ONDANSETRON HCL 4 MG/2ML IJ SOLN
4.0000 mg | Freq: Four times a day (QID) | INTRAMUSCULAR | Status: DC | PRN
  Administered 2024-01-18: 4 mg via INTRAVENOUS
  Filled 2024-01-15: qty 2

## 2024-01-15 MED ORDER — SODIUM CHLORIDE 0.9 % IV SOLN: INTRAVENOUS | Status: DC

## 2024-01-15 MED ORDER — ALBUTEROL SULFATE (2.5 MG/3ML) 0.083% IN NEBU: 2.5000 mg | INHALATION_SOLUTION | RESPIRATORY_TRACT | Status: DC | PRN

## 2024-01-15 MED ORDER — METHYLPREDNISOLONE SODIUM SUCC 40 MG IJ SOLR
40.0000 mg | Freq: Every day | INTRAMUSCULAR | Status: DC
  Administered 2024-01-16 – 2024-01-17 (×2): 40 mg via INTRAVENOUS
  Filled 2024-01-15 (×2): qty 1

## 2024-01-15 MED ORDER — AMLODIPINE BESYLATE 5 MG PO TABS
2.5000 mg | ORAL_TABLET | Freq: Every morning | ORAL | Status: DC
  Administered 2024-01-16 – 2024-01-19 (×4): 2.5 mg via ORAL
  Filled 2024-01-15 (×4): qty 1

## 2024-01-15 MED ORDER — FLUTICASONE FUROATE-VILANTEROL 200-25 MCG/ACT IN AEPB
1.0000 | INHALATION_SPRAY | Freq: Two times a day (BID) | RESPIRATORY_TRACT | Status: DC
  Filled 2024-01-15: qty 28

## 2024-01-15 MED ORDER — ACETAMINOPHEN 650 MG RE SUPP: 650.0000 mg | Freq: Four times a day (QID) | RECTAL | Status: DC | PRN

## 2024-01-15 MED ORDER — MIRABEGRON ER 50 MG PO TB24
50.0000 mg | ORAL_TABLET | Freq: Every day | ORAL | Status: DC
Start: 2024-01-16 — End: 2024-01-19
  Administered 2024-01-16 – 2024-01-19 (×4): 50 mg via ORAL
  Filled 2024-01-15 (×4): qty 1

## 2024-01-15 MED ORDER — CARVEDILOL 25 MG PO TABS
25.0000 mg | ORAL_TABLET | Freq: Two times a day (BID) | ORAL | Status: DC
  Administered 2024-01-16 – 2024-01-19 (×7): 25 mg via ORAL
  Filled 2024-01-15 (×7): qty 1

## 2024-01-15 MED ORDER — METHYLPREDNISOLONE SODIUM SUCC 125 MG IJ SOLR
125.0000 mg | Freq: Once | INTRAMUSCULAR | Status: AC
Start: 2024-01-15 — End: 2024-01-15
  Administered 2024-01-15: 125 mg via INTRAVENOUS
  Filled 2024-01-15: qty 2

## 2024-01-15 MED ORDER — SODIUM CHLORIDE 0.9% FLUSH: 3.0000 mL | INTRAVENOUS | Status: DC | PRN

## 2024-01-15 MED ORDER — SODIUM CHLORIDE 0.9% FLUSH
3.0000 mL | Freq: Two times a day (BID) | INTRAVENOUS | Status: DC
  Administered 2024-01-15 – 2024-01-19 (×8): 3 mL via INTRAVENOUS

## 2024-01-15 MED ORDER — SODIUM CHLORIDE 0.9 % IV SOLN: 250.0000 mL | INTRAVENOUS | Status: AC | PRN

## 2024-01-15 MED ORDER — MIRTAZAPINE 15 MG PO TABS
7.5000 mg | ORAL_TABLET | Freq: Every day | ORAL | Status: DC
  Administered 2024-01-15 – 2024-01-18 (×4): 7.5 mg via ORAL
  Filled 2024-01-15 (×4): qty 1

## 2024-01-15 MED ORDER — IPRATROPIUM-ALBUTEROL 0.5-2.5 (3) MG/3ML IN SOLN
RESPIRATORY_TRACT | Status: AC
  Filled 2024-01-15: qty 3

## 2024-01-15 MED ORDER — PHENOL 1.4 % MT LIQD
1.0000 | OROMUCOSAL | Status: DC | PRN
  Administered 2024-01-15: 1 via OROMUCOSAL
  Filled 2024-01-15: qty 177

## 2024-01-15 MED ORDER — ENOXAPARIN SODIUM 30 MG/0.3ML IJ SOSY
30.0000 mg | PREFILLED_SYRINGE | INTRAMUSCULAR | Status: DC
  Administered 2024-01-15 – 2024-01-18 (×4): 30 mg via SUBCUTANEOUS
  Filled 2024-01-15 (×4): qty 0.3

## 2024-01-15 MED ORDER — ALBUTEROL SULFATE (2.5 MG/3ML) 0.083% IN NEBU
2.5000 mg | INHALATION_SOLUTION | RESPIRATORY_TRACT | Status: DC | PRN
  Administered 2024-01-16 – 2024-01-18 (×3): 2.5 mg via RESPIRATORY_TRACT
  Filled 2024-01-15 (×3): qty 3

## 2024-01-15 NOTE — ED Triage Notes (Signed)
 Patient presents to ED with c/o shortness of breath that started last week +Intermittent generalized chest pain. Was evaluated at Urgent Care and dx with pneumonia today. Referred to emergency department for continued shortness of breath. Albuterol  treatment given at Urgent Care with minimal relief. Denies lightheadedness, dizziness, nausea, vomiting, fever

## 2024-01-15 NOTE — ED Provider Notes (Signed)
 [facility]    CSN: [id number] Arrival date & time: 01/15/24  1110      History   Chief Complaint Chief Complaint  Patient presents with   Cough    Was in a few weeks ago. Felt better for a little bit. Now feeling tired, achy and cough - Entered by patient    HPI Julie Lutz is a 83 y.o. female.   Julie Lutz is a 83 y.o. female presenting for chief complaint of cough, congestion, chills, and shortness of breath that started 2 days ago. She was sick with bronchitis/asthma exacerbation 2 weeks ago and received steroid dose pak, finished this and symptoms improved entirely then returned 2 days ago.  Reports cough is worse at nighttime and is sometimes productive and sometimes dry.  Her chest feels "tight" bilaterally. She is using home duoneb breathing treatments without much relief.  She has had chills without known fever at home.  Former smoker.  Denies nausea, vomiting, diarrhea, abdominal pain, rash, and recent sick contacts with similar symptoms.  Taking tylenol  and using duoneb at home with minimal relief.  Oxygen saturation is normally around 97%, currently 92% on room air.    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 04/27/2023   Hypomagnesemia 04/27/2023   Pure hypercholesterolemia 11/23/2022   Age-related osteoporosis  without current pathological fracture 11/03/2022   Chronic anemia 10/26/2022   Essential hypertension 05/25/2022   Gastroesophageal reflux disease without esophagitis 05/25/2022   Localized osteoporosis without current pathological fracture 05/25/2022   Asthma, chronic 05/25/2022   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      Home Medications    Prior to Admission medications   Medication Sig Start Date End Date Taking? Authorizing Provider  albuterol  (PROVENTIL  HFA) 108 (90 Base) MCG/ACT inhaler Inhale 1-2 puffs into the lungs every 6 (six) hours as needed for wheezing or shortness of breath.    [provider]  albuterol  (PROVENTIL ) (2.5 MG/3ML) 0.083% nebulizer solution Take 3 mLs (2.5 mg total) by nebulization every 6 (six) hours as needed for wheezing or shortness of breath. 07/30/22   Aleck Hurdle, MD  albuterol  (PROVENTIL ) (2.5 MG/3ML) 0.083% nebulizer solution Take 3 mLs (2.5 mg total) by nebulization every 4 (four) hours as needed for wheezing or shortness of breath. 01/19/24   Oral Billings, MD  ALPRAZolam  (  XANAX ) 0.5 MG tablet Take 1 tablet (0.5 mg total) by mouth at bedtime as needed for anxiety. 07/27/23   Christel Cousins, MD  amLODipine  (NORVASC ) 2.5 MG tablet Take 1 tablet (2.5 mg total) by mouth daily. 03/30/23   Christel Cousins, MD  atorvastatin  (LIPITOR) 10 MG tablet Take 1 tablet (10 mg total) by mouth daily. 02/03/23   Sonny Dust, MD  benzonatate  (TESSALON ) 100 MG capsule Take 1 capsule (100 mg total) by mouth every 8 (eight) hours. Patient not taking: Reported on 01/15/2024 01/01/24   Acevedo, Angela, PA   carvedilol  (COREG ) 25 MG tablet Take 1 tablet (25 mg total) by mouth 2 (two) times daily with a meal. 04/27/23   Christel Cousins, MD  Coenzyme Q10 (COQ-10 PO) Take 1 capsule by mouth daily.    [provider]  Cyanocobalamin  (VITAMIN B 12 PO) Take 1 tablet by mouth daily.    [provider]  docusate sodium  (COLACE) 100 MG capsule Take 1 capsule (100 mg total) by mouth 2 (two) times daily. 01/19/24 02/18/24  Oral Billings, MD  FASENRA  PEN 30 MG/ML prefilled autoinjector INJECT 1 PEN UNDER THE SKIN EVERY 8 WEEKS 10/25/23   Aleck Hurdle, MD  fluticasone -salmeterol (ADVAIR) 250-50 MCG/ACT AEPB Inhale 1 puff into the lungs in the morning and at bedtime. 01/12/24   Aleck Hurdle, MD  furosemide  (LASIX ) 40 MG tablet Take 40 mg by mouth daily.    [provider]  levofloxacin  (LEVAQUIN ) 750 MG tablet Take 1 tablet (750 mg total) by mouth every other day for 2 days. 01/20/24 01/23/24  Oral Billings, MD  Magnesium  Oxide (MAG-OXIDE PO) Take 1 tablet by mouth daily.    [provider]  mirabegron  ER (MYRBETRIQ ) 50 MG TB24 tablet Take 1 tablet (50 mg total) by mouth daily. 04/27/23   Christel Cousins, MD  mirtazapine  (REMERON ) 7.5 MG tablet Take 1 tablet (7.5 mg total) by mouth at bedtime. 03/30/23   Christel Cousins, MD  predniSONE  (DELTASONE ) 10 MG tablet Take 4 tablets (40 mg total) by mouth daily with breakfast for 2 days, THEN 2 tablets (20 mg total) daily with breakfast for 2 days, THEN 1 tablet (10 mg total) daily with breakfast for 2 days, THEN 0.5 tablets (5 mg total) daily with breakfast for 2 days. 01/19/24 01/27/24  Oral Billings, MD  predniSONE  (STERAPRED UNI-PAK 21 TAB) 10 MG (21) TBPK tablet Take as directed 01/01/24   Acevedo, Angela, PA  PREVACID  30 MG capsule Take 1 capsule (30 mg total) by mouth daily at 12 noon. 07/27/23   Christel Cousins, MD  tiotropium (SPIRIVA) 18 MCG inhalation capsule Place 18 mcg into inhaler and inhale daily.    [provider]   Vitamin D , Ergocalciferol , 50 MCG (2000 UT) CAPS Take by mouth daily at 6 (six) AM.    [provider]    Family History Family History  Problem Relation Age of Onset   Diabetes Sister    Cancer Sister 80       ovarian   Anemia Sister    Allergies Daughter    Asthma Neg Hx     Social History Social History   Tobacco Use   Smoking status: Former    Current packs/day: 0.00    Types: Cigarettes    Quit date: 1990    Years since quitting: 35.3    Passive exposure: Never   Smokeless tobacco: Never  Vaping Use  Vaping status: Never Used  Substance Use Topics   Alcohol use: Yes    Alcohol/week: 5.0 standard drinks of alcohol    Types: 5 Glasses of wine per week    Comment: occasional   Drug use: Never     Allergies   Patient has no known allergies.   Review of Systems Review of Systems  Respiratory:  Positive for cough.   Per HPI   Physical Exam Triage Vital Signs ED Triage Vitals  Encounter Vitals Group     BP 01/15/24 1134 124/68     Systolic BP Percentile --      Diastolic BP Percentile --      Pulse Rate 01/15/24 1134 84     Resp 01/15/24 1134 (!) 22     Temp 01/15/24 1134 98.9 F (37.2 C)     Temp Source 01/15/24 1134 Oral     SpO2 01/15/24 1134 91 %     Weight --      Height --      Head Circumference --      Peak Flow --      Pain Score 01/15/24 1137 8     Pain Loc --      Pain Education --      Exclude from Growth Chart --    No data found.  Updated Vital Signs BP 124/68 (BP Location: Right Arm)   Pulse 84   Temp 98.9 F (37.2 C) (Oral)   Resp (!) 22   SpO2 92%   Visual Acuity Right Eye Distance:   Left Eye Distance:   Bilateral Distance:    Right Eye Near:   Left Eye Near:    Bilateral Near:     Physical Exam Vitals and nursing note reviewed.  Constitutional:      Appearance: She is not ill-appearing or toxic-appearing.  HENT:     Head: Normocephalic and atraumatic.     Right Ear: Hearing and external ear  normal.     Left Ear: Hearing and external ear normal.     Nose: Nose normal.     Mouth/Throat:     Lips: Pink.     Mouth: Mucous membranes are moist. No injury or oral lesions.     Dentition: Normal dentition.     Tongue: No lesions.     Pharynx: Oropharynx is clear. Uvula midline. No pharyngeal swelling, oropharyngeal exudate, posterior oropharyngeal erythema, uvula swelling or postnasal drip.     Tonsils: No tonsillar exudate.  Eyes:     General: Lids are normal. Vision grossly intact. Gaze aligned appropriately.     Extraocular Movements: Extraocular movements intact.     Conjunctiva/sclera: Conjunctivae normal.  Neck:     Trachea: Trachea and phonation normal.  Cardiovascular:     Rate and Rhythm: Normal rate and regular rhythm.     Heart sounds: Normal heart sounds, S1 normal and S2 normal.  Pulmonary:     Effort: Tachypnea, prolonged expiration and respiratory distress present.     Breath sounds: Normal air entry. No stridor, decreased air movement or transmitted upper airway sounds. Decreased breath sounds and wheezing present. No rhonchi or rales.     Comments: Diffuse inspiratory and expiratory wheezing to all lung fields bilaterally with diminished lung sounds throughout.  Initially speaking in short sentences with moderate difficulty and tachypnea.  Chest:     Chest wall: No tenderness.  Musculoskeletal:     Cervical back: Neck supple.  Lymphadenopathy:     Cervical: No  cervical adenopathy.  Skin:    General: Skin is warm and dry.     Capillary Refill: Capillary refill takes less than 2 seconds.     Findings: No rash.  Neurological:     General: No focal deficit present.     Mental Status: She is alert and oriented to person, place, and time. Mental status is at baseline.     Cranial Nerves: No dysarthria or facial asymmetry.  Psychiatric:        Mood and Affect: Mood normal.        Speech: Speech normal.        Behavior: Behavior normal.        Thought Content:  Thought content normal.        Judgment: Judgment normal.      UC Treatments / Results  Labs (all labs ordered are listed, but only abnormal results are displayed) Labs Reviewed - No data to display  EKG   Radiology No results found.  Procedures Procedures (including critical care time)  Medications Ordered in UC Medications  ipratropium-albuterol  (DUONEB) 0.5-2.5 (3) MG/3ML nebulizer solution 3 mL (3 mLs Nebulization Given 01/15/24 1147)    Initial Impression / Assessment and Plan / UC Course  I have reviewed the triage vital signs and the nursing notes.  Pertinent labs & imaging results that were available during my care of the patient were reviewed by me and considered in my medical decision making (see chart for details).   1. Moderate persistent asthma with acute exacerbation, acute cough, shortness of breath Chest x-ray shows diffuse peribronchial thickening with hazy opacity to the left upper lung field on my interpretation.  Patient given breathing treatment (duoneb) in clinic with minimal improvement in respiratory status.  She is moving more air on reassessment, however oxygen saturation remains lower than baseline at around 90-92% on room air.  Her daughter is with her at the bedside. Recommend transfer to the nearest ER for further workup and evaluation to rule out acute cardiopulmonary abnormality. Shortly after discharge to ER, radiology re-read of chest x-ray confirms community acquired pneumonia. She meets inpatient admission criteria for treatment of pneumonia.   Discussed clinical concerns/exam findings leading to recommendation for further workup in the ER setting and risks of deferring ER visit with patient/family. Patient/family express understanding and agreement with plan, discharged to ER via private car with her daughter.   Final Clinical Impressions(s) / UC Diagnoses   Final diagnoses:  Shortness of breath  Acute cough  Moderate persistent asthma  with (acute) exacerbation   Discharge Instructions   None    ED Prescriptions   None    PDMP not reviewed this encounter.   Starlene Eaton, [place] 01/19/24 1616

## 2024-01-15 NOTE — H&P (Addendum)
 History and Physical    Julie Lutz VZD:[id number] DOB: 02/17/1941 DOA: 01/15/2024  PCP: Christel Cousins, MD   Patient coming from: Home   Chief Complaint:  Chief Complaint  Patient presents with   Shortness of Breath   ED TRIAGE note:Pt c/o cough since 4/13. She was seen at [facility] and given steroid and symptoms got better. Two days ago she began to feel worse with chills, cough, congestion, and sob.   HPI:  Julie Lutz is a 83 y.o. 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  Initially has been seen in urgent care treated with a steroid improved however for last 2 days symptom has been worsened developed fever, chills, congestion and shortness of breath.  Daughter at the bedside reported that lately patient has been drinking a lot of water as she continues to cough and complaining of shortness of breath and feels thirsty. Patient is complaining about productive cough, shortness of breath and wheezing.  Denies any fever fever, chill, chest pain, and palpitation.  Denies any difficulty swallowing and sensation of throat closure. No other complaint at this time.  ED Course:  At presentation to ED patient is tachycardic, otherwise hemodynamically stable.  CMP showing low sodium 122.  Baseline sodium is around 128-131), elevated creatinine 1.18. Low magnesium  0.8.  Normal phosphorus level. Low serum osmolarity 259, low urine osmolality 160, and urine sodium 17. CBC showing leukocytosis 13, stable H&H and normal platelet count.  Chest x-ray showed inferior right upper lobe opacity concerning  for pneumonia.  Stable cardiomegaly.  Stable linear atelectasis.  In the ED patient has been treated with albuterol  nebulizer, DuoNeb nebulizer and mag sulfate 2 g.  Hospitalist has been consulted and patient has been [facility] for the management of hyponatremia, hypomagnesemia and pneumonia.  Significant labs in the ED: Lab Orders         Culture, blood (routine x 2) Call MD if unable to obtain prior to antibiotics being given         Expectorated Sputum Assessment w Gram Stain, Rflx to Resp Cult         Respiratory (~20 pathogens) panel by PCR         Urine Culture (for pregnant, neutropenic or urologic patients or patients with an indwelling urinary catheter)         CBC with Differential         Comprehensive metabolic panel         Urinalysis, Routine w reflex microscopic -Urine, Clean Catch         Sodium, urine, random         Osmolality         Osmolality, urine         Magnesium          Phosphorus         Legionella Pneumophila Serogp 1 Ur Ag         Strep pneumoniae urinary antigen         CBC         Procalcitonin         Basic metabolic panel         Sodium  Review of Systems:  Review of Systems  Constitutional:  Negative for chills, fever, malaise/fatigue and weight loss.  Respiratory:  Positive for cough, sputum production, shortness of breath and wheezing.   Cardiovascular:  Negative for chest pain, palpitations, orthopnea, leg swelling and PND.  Gastrointestinal:  Negative for abdominal pain, diarrhea, heartburn, nausea and vomiting.  Genitourinary:  Positive for urgency. Negative for dysuria, frequency and hematuria.  Musculoskeletal:  Negative for back pain, joint pain, myalgias and neck pain.  Skin:  Negative for itching and rash.  Neurological:  Negative for dizziness and headaches.  Psychiatric/Behavioral:  The patient is not nervous/anxious.     Past Medical History:  Diagnosis Date   Allergy    Arthritis     Asthma    esosinophillic   Cancer (HCC)    vocal cord   Cataract    GERD (gastroesophageal reflux disease)    Hyperlipidemia    Hypertension    Hyponatremia    Osteoporosis    Spinal stenosis     Past Surgical History:  Procedure Laterality Date   FEMUR FRACTURE SURGERY Right 08/2016   HERNIA REPAIR Right    inguinal   JOINT REPLACEMENT  2017   bilateral knee   LAMINECTOMY  11/2020   L3-5   LARYNGOSCOPY  11/2021   SPINAL FUSION  03/2021     reports that she quit smoking about 35 years ago. Her smoking use included cigarettes. She has never been exposed to tobacco smoke. She has never used smokeless tobacco. She reports current alcohol use of about 5.0 standard drinks of alcohol per week. She reports that she does not use drugs.  No Known Allergies  Family History  Problem Relation Age of Onset   Diabetes Sister    Cancer Sister 83       ovarian   Anemia Sister    Allergies Daughter    Asthma Neg Hx     Prior to Admission medications   Medication Sig Start Date End Date Taking? Authorizing Provider  albuterol  (PROVENTIL  HFA) 108 (90 Base) MCG/ACT inhaler Inhale 1-2 puffs into the lungs every 6 (six) hours as needed for wheezing or shortness of breath.    [provider]  albuterol  (PROVENTIL ) (2.5 MG/3ML) 0.083% nebulizer solution Take 3 mLs (2.5 mg total) by nebulization every 6 (six) hours as needed for wheezing or shortness of breath. 07/30/22   Aleck Hurdle, MD  ALPRAZolam  (XANAX ) 0.5 MG tablet Take 1 tablet (0.5 mg total) by mouth at bedtime as needed for anxiety. 07/27/23   Christel Cousins, MD  amLODipine  (NORVASC ) 2.5 MG tablet Take 1 tablet (2.5 mg total) by mouth daily. 03/30/23   Christel Cousins, MD  atorvastatin  (LIPITOR) 10 MG tablet Take 1 tablet (10 mg total) by mouth daily. 02/03/23   Sonny Dust, MD  azelastine  (ASTELIN ) 0.1 % nasal spray Place 2 sprays into both nostrils 2 (two) times daily. Patient not taking: Reported on  11/02/2023 11/23/22   Christel Cousins, MD  benzonatate  (TESSALON ) 100 MG capsule Take 1 capsule (100 mg total) by mouth every 8 (eight) hours. 01/01/24   Acevedo, Angela, PA  carvedilol  (COREG ) 25 MG tablet Take 1 tablet (25 mg total) by mouth 2 (two) times daily with a meal. 04/27/23   Christel Cousins, MD  Coenzyme Q10 (COQ-10 PO) Take by mouth.    [provider]  Cyanocobalamin  (VITAMIN B 12 PO) Take by mouth.    [provider]  FASENRA  PEN  30 MG/ML prefilled autoinjector INJECT 1 PEN UNDER THE SKIN EVERY 8 WEEKS 10/25/23   Desai, Nikita S, MD  Ferrous Sulfate (IRON PO) Take by mouth every other day. Patient not taking: Reported on 11/02/2023    [provider]  fluticasone -salmeterol (ADVAIR) 250-50 MCG/ACT AEPB Inhale 1 puff into the lungs in the morning and at bedtime. 01/12/24   Aleck Hurdle, MD  furosemide  (LASIX ) 40 MG tablet ONE BY MOUTH DAILY    [provider]  Magnesium  Oxide (MAG-OXIDE PO) Take by mouth daily.    [provider]  mirabegron  ER (MYRBETRIQ ) 50 MG TB24 tablet Take 1 tablet (50 mg total) by mouth daily. 04/27/23   Christel Cousins, MD  mirtazapine  (REMERON ) 7.5 MG tablet Take 1 tablet (7.5 mg total) by mouth at bedtime. 03/30/23   Christel Cousins, MD  ondansetron  (ZOFRAN -ODT) 4 MG disintegrating tablet Take 1 tablet (4 mg total) by mouth every 8 (eight) hours as needed. Patient not taking: Reported on 11/02/2023 04/06/23   Small, Brooke L, PA  predniSONE  (STERAPRED UNI-PAK 21 TAB) 10 MG (21) TBPK tablet Take as directed 01/01/24   Acevedo, Angela, PA  PREVACID  30 MG capsule Take 1 capsule (30 mg total) by mouth daily at 12 noon. 07/27/23   Christel Cousins, MD  tiotropium (SPIRIVA) 18 MCG inhalation capsule Place 18 mcg into inhaler and inhale daily.    [provider]  Tiotropium Bromide Monohydrate (SPIRIVA RESPIMAT) 1.25 MCG/ACT AERS Inhale 2 puffs into the lungs daily. 10/26/19   [provider]  Vitamin D ,  Ergocalciferol , 50 MCG (2000 UT) CAPS Take by mouth daily at 6 (six) AM.    [provider]     Physical Exam: Vitals:   01/15/24 1540 01/15/24 1630 01/15/24 1800 01/15/24 1921  BP: 125/61 111/87 113/73 (!) 152/93  Pulse: 92 92 100 (!) 101  Resp: (!) 22 20 20 17   Temp:   99.2 F (37.3 C) 99.4 F (37.4 C)  TempSrc:   Oral Oral  SpO2: 99% 92% 100% 93%  Weight:      Height:        Physical Exam Constitutional:      Appearance: She is obese. She is ill-appearing.  HENT:     Mouth/Throat:     Mouth: Mucous membranes are moist.  Cardiovascular:     Rate and Rhythm: Normal rate and regular rhythm.  Pulmonary:     Effort: Pulmonary effort is normal. No tachypnea or accessory muscle usage.     Breath sounds: No stridor. Examination of the right-upper field reveals wheezing. Examination of the left-upper field reveals wheezing. Wheezing present. No decreased breath sounds, rhonchi or rales.  Musculoskeletal:     Right lower leg: No edema.     Left lower leg: No edema.  Skin:    Capillary Refill: Capillary refill takes less than 2 seconds.  Neurological:     Mental Status: She is alert and oriented to person, place, and time.  Psychiatric:        Mood and Affect: Mood normal. Mood is not anxious.        Behavior: Behavior is not agitated.      Labs on Admission: I have personally reviewed following labs and imaging studies  CBC: Recent Labs  Lab 01/15/24 1353  WBC 13.0*  NEUTROABS 10.8*  HGB 11.0*  HCT 32.7*  MCV 86.3  PLT 294   Basic Metabolic Panel: Recent Labs  Lab 01/15/24 1353  NA 122*  K 4.0  CL 83*  CO2 25  GLUCOSE 108*  BUN 12  CREATININE 1.18*  CALCIUM  8.8*  MG 0.8*  PHOS 2.7   GFR: Estimated Creatinine Clearance: 24.9 mL/min (A) (by C-G formula based on SCr of 1.18 mg/dL (H)). Liver Function Tests: Recent Labs  Lab 01/15/24 1353  AST 17  ALT 14  ALKPHOS 96  BILITOT 0.6  PROT 6.7  ALBUMIN 3.8   No results for input(s):  "LIPASE", "AMYLASE" in the last 168 hours. No results for input(s): "AMMONIA" in the last 168 hours. Coagulation Profile: No results for input(s): "INR", "PROTIME" in the last 168 hours. Cardiac Enzymes: No results for input(s): "CKTOTAL", "CKMB", "CKMBINDEX", "TROPONINI", "TROPONINIHS" in the last 168 hours. BNP (last 3 results) No results for input(s): "BNP" in the last 8760 hours. HbA1C: No results for input(s): "HGBA1C" in the last 72 hours. CBG: No results for input(s): "GLUCAP" in the last 168 hours. Lipid Profile: No results for input(s): "CHOL", "HDL", "LDLCALC", "TRIG", "CHOLHDL", "LDLDIRECT" in the last 72 hours. Thyroid  Function Tests: No results for input(s): "TSH", "T4TOTAL", "FREET4", "T3FREE", "THYROIDAB" in the last 72 hours. Anemia Panel: No results for input(s): "VITAMINB12", "FOLATE", "FERRITIN", "TIBC", "IRON", "RETICCTPCT" in the last 72 hours. Urine analysis:    Component Value Date/Time   COLORURINE YELLOW 01/15/2024 1530   APPEARANCEUR HAZY (A) 01/15/2024 1530   LABSPEC 1.006 01/15/2024 1530   PHURINE 7.0 01/15/2024 1530   GLUCOSEU NEGATIVE 01/15/2024 1530   HGBUR TRACE (A) 01/15/2024 1530   BILIRUBINUR NEGATIVE 01/15/2024 1530   KETONESUR NEGATIVE 01/15/2024 1530   PROTEINUR TRACE (A) 01/15/2024 1530   NITRITE POSITIVE (A) 01/15/2024 1530   LEUKOCYTESUR LARGE (A) 01/15/2024 1530    Radiological Exams on Admission: I have personally reviewed images DG Chest 2 View Result Date: 01/15/2024 CLINICAL DATA:  Cough and congestion for 3 weeks. EXAM: CHEST - 2 VIEW COMPARISON:  Two-view chest x-ray 04/06/2023. FINDINGS: The heart is enlarged. Atherosclerotic calcifications are present at the aortic arch. Linear atelectasis or scarring in the left lower lobe is stable. New inferior right upper lobe airspace opacity is present. No significant edema or effusion is present. Exaggerated thoracic kyphosis is again noted. Lumbar fusion is noted. IMPRESSION: 1. New  inferior right upper lobe airspace opacity concerning for pneumonia. 2. Stable cardiomegaly without failure. 3. Stable linear atelectasis or scarring in the left lower lobe. Electronically Signed   By: Audree Leas M.D.   On: 01/15/2024 12:42     EKG: My personal interpretation of EKG shows: Sinus rhythm with first-degree AV block    Assessment/Plan: Principal Problem:   CAP (community acquired pneumonia) Active Problems:   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   Pneumonia due to COVID-19 virus   Essential hypertension   Asthma, chronic   History of vocal cord cancer (HCC)   Chronic anemia   Hypomagnesemia   Hyperlipidemia   Asthma, chronic obstructive, with acute exacerbation (HCC)   AKI (acute kidney injury) (HCC)   Acute cystitis    Assessment and Plan: Community-acquired pneumonia Acute asthma exacerbation COVID-19 pneumonia -Patient presented to emergency department worsening fever, chill, cough and shortness of breath.  Treated with oral steroid without much improvement of the symptoms. - At presentation to ED patient is hemodynamically stable. - CBC showing leukocytosis and CMP low sodium 122. - Chest x-ray showing evidence of pneumonia. - Checking respiratory panel, blood culture, sputum culture, urine Legionella antigen, urine strep antigen. - Starting IV ceftriaxone  and doxycycline. - Treating with IV Solu-Medrol.  At home patient is not any long-acting bronchodilator.  So starting Breo Ellipta twice daily and continue albuterol  nebulizer as needed. Continue supportive care. -Need to follow-up with culture result for appropriate antibiotic guidance. Addendum - Respiratory panel positive with COVID-19.  However patient is not requiring any oxygen.  Given chest x-ray showing evidence of pneumonia discussed case with pharmacy and plan to treat with Paxlovid.   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 History of SIADH -Patient has previous history of hyponatremia which is  multifactorial in the setting of chronic diuretics use, SIADH and chronic PPI use.  Currently patient is on Lasix . -Baseline serum sodium 128-131.  Sodium level is 122 today. - Low serum Osmo 259, low serum osmolarity 160 and urine sodium 17. This patient has mixed picture of hyponatremia secondary to SIADH, and use of Lasix . -Currently continuing fluid restriction 1.2 L/day.  Checking serum sodium level every 4 hours.  Goal to improve sodium around 8 to 10 mEq in next 24 hours. If sodium level is not improving with fluid restriction in that case will reach out to nephrology for further recommendation.   Prerenal acute kidney injury-setting of pneumonia - Elevated creatinine 1.18.  UA showing normal specific gravity, hazy appearance nitrate positive large leukocyte esterase and few bacteria.  WBC positive. - Prerenal acute kidney injury in the setting of pneumonia.   -Holding IV fluid resuscitation given patient also has hyponatremia and currently on fluid restriction. -Continue to monitor renal function.  Acute cystitis - UA showing evidence of UTI.  Pending urine culture. - Continue to treat with ceftriaxone  both for pneumonia and cystitis.  Hypomagnesemia -Low magnesium  0.8.  In the ED patient has been repleted with 2 g.  Replating with another dose of IV mag sulfate 2 g. - Replete electrolyte as needed.  Chronic anemia -Stable H&H continue to monitor.  Hyperlipidemia -Continue Lipitor  History of vocal cord cancer History of dysphonia -Patient follows otolaryngology with [facility].  Essential hypertension -Continue amlodipine .  Holding Lasix  in the setting of hyponatremia  Hyperlipidemia -Continue Lipitor  DVT prophylaxis:  Lovenox  and is 80 Code Status:  Full Code Diet: Heart healthy diet with fluid restriction 1.2 L/day Family Communication:   Family was present at bedside, at the time of interview. Opportunity was given to ask question and all questions  were answered satisfactorily.  Disposition Plan: Continue to monitor improvement of sodium level and need to avoid blood cultures/sputum culture Consults: None at this time Admission status:   Inpatient, Telemetry bed  Severity of Illness: The appropriate patient status for this patient is INPATIENT. Inpatient status is judged to be reasonable and necessary in order to provide the required intensity of service to ensure the patient's safety. The patient's presenting symptoms, physical exam findings, and initial radiographic and laboratory data in the context of their chronic comorbidities is felt to place them at high risk for further clinical deterioration. Furthermore, it is not anticipated that the patient will be medically stable for discharge from the hospital within 2 midnights of admission.   * I certify that at the point of admission it is my clinical judgment that the patient will require inpatient hospital care spanning beyond 2 midnights from the point of admission due to high intensity of service, high risk for further deterioration and high frequency of surveillance required.Aaron Aas    Aspin Palomarez, MD [facility]  How to contact the [facility] Attending or Consulting provider 7A - 7P or covering provider during after hours 7P -7A, for  this patient.  Check the care team in [facility] and look for a) attending/consulting [facility] provider listed and b) the [facility] team listed Log into [email or web address] and use [facility] universal password to access. If you do not have the password, please contact the hospital operator. Locate the [facility] provider you are looking for under [facility] and page to a number that you can be directly reached. If you still have difficulty reaching the provider, please page the [facility] (Director on Call) for the Hospitalists listed on amion for assistance.  01/15/2024, 11:25 PM

## 2024-01-15 NOTE — ED Notes (Signed)
 Pt had CXR done at [facility]

## 2024-01-15 NOTE — ED Notes (Signed)
 [facility] called for pt bed assignment. Judythe Nurse, RN intercepted the call and gave further information for transport

## 2024-01-15 NOTE — ED Notes (Signed)
 Updated family.

## 2024-01-15 NOTE — ED Triage Notes (Signed)
 Pt c/o cough since 4/13. She was seen at [facility] and given steroid and symptoms got better. Two days ago she began to feel worse with chills, cough, congestion, and sob.

## 2024-01-15 NOTE — ED Notes (Signed)
 Patient is being discharged from the Urgent Care and sent to the Emergency Department via POV . Per Shella Devoid, FNP, patient is in need of higher level of care due to SOB . Patient is aware and verbalizes understanding of plan of care.  Vitals:   01/15/24 1138 01/15/24 1142  BP:    Pulse:    Resp:    Temp:    SpO2: 94% 92%

## 2024-01-15 NOTE — ED Provider Notes (Cosign Needed Addendum)
 [place] EMERGENCY DEPARTMENT AT [facility] Provider Note   CSN: [id number] Arrival date & time: 01/15/24  1337     History  Chief Complaint  Patient presents with   Shortness of Breath    Julie Lutz is a 83 y.o. female medical history significant for GERD, asthma, hypertension, presents today for shortness of breath that began last week with intermittent chest pain.  Patient was evaluated urgent care today and diagnosed with pneumonia.  Patient was referred to the ED for continued shortness of breath.  Patient had albuterol  treatment given at urgent care with minimal relief.  Patient denies nausea, vomiting, fever, leg swelling, lightheadedness, or dizziness.   Shortness of Breath Associated symptoms: chest pain        Home Medications Prior to Admission medications   Medication Sig Start Date End Date Taking? Authorizing Provider  albuterol  (PROVENTIL  HFA) 108 (90 Base) MCG/ACT inhaler Inhale 1-2 puffs into the lungs every 6 (six) hours as needed for wheezing or shortness of breath.    [provider]  albuterol  (PROVENTIL ) (2.5 MG/3ML) 0.083% nebulizer solution Take 3 mLs (2.5 mg total) by nebulization every 6 (six) hours as needed for wheezing or shortness of breath. 07/30/22   Aleck Hurdle, MD  ALPRAZolam  (XANAX ) 0.5 MG tablet Take 1 tablet (0.5 mg total) by mouth at bedtime as needed for anxiety. 07/27/23   Christel Cousins, MD  amLODipine  (NORVASC ) 2.5 MG tablet Take 1 tablet (2.5 mg total) by mouth daily. 03/30/23   Christel Cousins, MD  atorvastatin  (LIPITOR) 10 MG tablet Take 1 tablet (10 mg total) by mouth daily. 02/03/23   Sonny Dust, MD  azelastine  (ASTELIN ) 0.1 % nasal spray Place 2 sprays into both nostrils 2 (two) times daily. Patient not taking: Reported on 11/02/2023 11/23/22   Christel Cousins, MD  benzonatate  (TESSALON ) 100 MG capsule Take 1 capsule (100 mg total) by mouth every 8 (eight) hours. 01/01/24   Acevedo, Angela, PA   carvedilol  (COREG ) 25 MG tablet Take 1 tablet (25 mg total) by mouth 2 (two) times daily with a meal. 04/27/23   Christel Cousins, MD  Coenzyme Q10 (COQ-10 PO) Take by mouth.    [provider]  Cyanocobalamin  (VITAMIN B 12 PO) Take by mouth.    [provider]  FASENRA  PEN 30 MG/ML prefilled autoinjector INJECT 1 PEN UNDER THE SKIN EVERY 8 WEEKS 10/25/23   Desai, Nikita S, MD  Ferrous Sulfate (IRON PO) Take by mouth every other day. Patient not taking: Reported on 11/02/2023    [provider]  fluticasone -salmeterol (ADVAIR) 250-50 MCG/ACT AEPB Inhale 1 puff into the lungs in the morning and at bedtime. 01/12/24   Aleck Hurdle, MD  furosemide  (LASIX ) 40 MG tablet ONE BY MOUTH DAILY    [provider]  Magnesium  Oxide (MAG-OXIDE PO) Take by mouth daily.    [provider]  mirabegron  ER (MYRBETRIQ ) 50 MG TB24 tablet Take 1 tablet (50 mg total) by mouth daily. 04/27/23   Christel Cousins, MD  mirtazapine  (REMERON ) 7.5 MG tablet Take 1 tablet (7.5 mg total) by mouth at bedtime. 03/30/23   Christel Cousins, MD  ondansetron  (ZOFRAN -ODT) 4 MG disintegrating tablet Take 1 tablet (4 mg total) by mouth every 8 (eight) hours as needed. Patient not taking: Reported on 11/02/2023 04/06/23   Small, Brooke L, PA  predniSONE  (STERAPRED UNI-PAK 21 TAB) 10 MG (21) TBPK tablet Take as directed 01/01/24   Acevedo, Angela,  PA  PREVACID  30 MG capsule Take 1 capsule (30 mg total) by mouth daily at 12 noon. 07/27/23   Christel Cousins, MD  tiotropium (SPIRIVA) 18 MCG inhalation capsule Place 18 mcg into inhaler and inhale daily.    [provider]  Tiotropium Bromide Monohydrate (SPIRIVA RESPIMAT) 1.25 MCG/ACT AERS Inhale 2 puffs into the lungs daily. 10/26/19   [provider]  Vitamin D , Ergocalciferol , 50 MCG (2000 UT) CAPS Take by mouth daily at 6 (six) AM.    [provider]      Allergies    Patient has no known allergies.    Review of Systems    Review of Systems  Respiratory:  Positive for shortness of breath.   Cardiovascular:  Positive for chest pain.    Physical Exam Updated Vital Signs BP 120/65 (BP Location: Right Arm)   Pulse 89   Temp 97.9 F (36.6 C)   Resp 16   Ht 4\' 8"  (1.422 m)   Wt 58.5 kg   SpO2 98%   BMI 28.92 kg/m  Physical Exam Vitals and nursing note reviewed.  Constitutional:      General: She is not in acute distress.    Appearance: She is well-developed. She is ill-appearing. She is not toxic-appearing or diaphoretic.  HENT:     Head: Normocephalic and atraumatic.  Eyes:     Extraocular Movements: Extraocular movements intact.     Conjunctiva/sclera: Conjunctivae normal.  Cardiovascular:     Rate and Rhythm: Normal rate and regular rhythm.     Pulses: Normal pulses.     Heart sounds: Normal heart sounds. No murmur heard. Pulmonary:     Effort: Pulmonary effort is normal. No respiratory distress.     Breath sounds: Normal breath sounds. Transmitted upper airway sounds present.  Abdominal:     Palpations: Abdomen is soft.     Tenderness: There is no abdominal tenderness.  Musculoskeletal:        General: No swelling.     Cervical back: Neck supple.  Skin:    General: Skin is warm and dry.     Capillary Refill: Capillary refill takes less than 2 seconds.  Neurological:     General: No focal deficit present.     Mental Status: She is alert and oriented to person, place, and time.     Motor: No weakness.  Psychiatric:        Mood and Affect: Mood normal.        Behavior: Behavior normal.     ED Results / Procedures / Treatments   Labs (all labs ordered are listed, but only abnormal results are displayed) Labs Reviewed  CBC WITH DIFFERENTIAL/PLATELET - Abnormal; Notable for the following components:      Result Value   WBC 13.0 (*)    RBC 3.79 (*)    Hemoglobin 11.0 (*)    HCT 32.7 (*)    Neutro Abs 10.8 (*)    Monocytes Absolute 1.1 (*)    All other components within normal  limits  COMPREHENSIVE METABOLIC PANEL WITH GFR - Abnormal; Notable for the following components:   Sodium 122 (*)    Chloride 83 (*)    Glucose, Bld 108 (*)    Creatinine, Ser 1.18 (*)    Calcium  8.8 (*)    GFR, Estimated 45 (*)    All other components within normal limits    EKG None  Radiology DG Chest 2 View Result Date: 01/15/2024 CLINICAL DATA:  Cough  and congestion for 3 weeks. EXAM: CHEST - 2 VIEW COMPARISON:  Two-view chest x-ray 04/06/2023. FINDINGS: The heart is enlarged. Atherosclerotic calcifications are present at the aortic arch. Linear atelectasis or scarring in the left lower lobe is stable. New inferior right upper lobe airspace opacity is present. No significant edema or effusion is present. Exaggerated thoracic kyphosis is again noted. Lumbar fusion is noted. IMPRESSION: 1. New inferior right upper lobe airspace opacity concerning for pneumonia. 2. Stable cardiomegaly without failure. 3. Stable linear atelectasis or scarring in the left lower lobe. Electronically Signed   By: Audree Leas M.D.   On: 01/15/2024 12:42    Procedures .Critical Care  Performed by: Carie Charity, PA-C Authorized by: Carie Charity, PA-C   Critical care provider statement:    Critical care time (minutes):  40   Critical care was necessary to treat or prevent imminent or life-threatening deterioration of the following conditions:  Metabolic crisis   Critical care was time spent personally by me on the following activities:  Blood draw for specimens, ordering and performing treatments and interventions, ordering and review of laboratory studies, development of treatment plan with patient or surrogate, discussions with consultants, examination of patient, obtaining history from patient or surrogate, pulse oximetry, review of old charts and re-evaluation of patient's condition   Care discussed with: admitting provider       Medications Ordered in ED Medications  albuterol  (VENTOLIN   HFA) 108 (90 Base) MCG/ACT inhaler 2 puff (has no administration in time range)    ED Course/ Medical Decision Making/ A&P                                 Medical Decision Making Amount and/or Complexity of Data Reviewed Labs: ordered.  Risk Prescription drug management. Decision regarding hospitalization.   This patient presents to the ED for concern of shortness of breath and chest pain, this involves an extensive number of treatment options, and is a complaint that carries with it a high risk of complications and morbidity.  The differential diagnosis includes PE, STEMI, NSTEMI, pneumonia, asthma exacerbation, COVID, flu, RSV   Co morbidities that complicate the patient evaluation  Asthma, GERD, hypertension   Additional history obtained: External records from outside source obtained and reviewed including family medicine notes   Lab Tests:  I Ordered, and personally interpreted labs.  The pertinent results include: Leukocytosis at 13 with left shift, mildly elevated creatinine at 1.18, hyponatremia at 122, decreased chloride at 83, hypomagnesemia at 0.8   Imaging Studies ordered:  I ordered imaging studies including chest x-ray I independently visualized and interpreted imaging which showed new inferior right upper lobe airspace opacity concerning for pneumonia.  Stable cardiomegaly without failure.  Stable linear atelectasis or scarring in the left lower lobe. I agree with the radiologist interpretation   Cardiac Monitoring: / EKG:  The patient was maintained on a cardiac monitor.  I personally viewed and interpreted the cardiac monitored which showed an underlying rhythm of: Sinus with first-degree AV block   Problem List / ED Course / Critical interventions / Medication management  I ordered medication including magnesium  for hypomagnesemia Reevaluation of the patient after these medicines showed that the patient stayed the same I have reviewed the patients  home medicines and have made adjustments as needed   Consultations Obtained:  I requested consultation with the hospitalist, Dr. Elsworth Halt,  and discussed lab and imaging findings as  well as pertinent plan - they recommend: Admission for hyponatremia and pneumonia    Test / Admission - Considered:  admit        Final Clinical Impression(s) / ED Diagnoses Final diagnoses:  None    Rx / DC Orders ED Discharge Orders     None         Carie Charity, PA-C 01/15/24 1645    Carie Charity, PA-C 01/15/24 1646    Deatra Face, MD 01/16/24 [phone]

## 2024-01-16 ENCOUNTER — Other Ambulatory Visit: Payer: Self-pay

## 2024-01-16 ENCOUNTER — Encounter (HOSPITAL_COMMUNITY): Payer: Self-pay | Admitting: Family Medicine

## 2024-01-16 DIAGNOSIS — J189 Pneumonia, unspecified organism: Secondary | ICD-10-CM | POA: Diagnosis not present

## 2024-01-16 LAB — CBC
HCT: 31.2 % — ABNORMAL LOW (ref 36.0–46.0)
Hemoglobin: 10.5 g/dL — ABNORMAL LOW (ref 12.0–15.0)
MCH: 29.3 pg (ref 26.0–34.0)
MCHC: 33.7 g/dL (ref 30.0–36.0)
MCV: 87.2 fL (ref 80.0–100.0)
Platelets: 268 10*3/uL (ref 150–400)
RBC: 3.58 MIL/uL — ABNORMAL LOW (ref 3.87–5.11)
RDW: 13.3 % (ref 11.5–15.5)
WBC: 12.8 10*3/uL — ABNORMAL HIGH (ref 4.0–10.5)
nRBC: 0 % (ref 0.0–0.2)

## 2024-01-16 LAB — BASIC METABOLIC PANEL WITH GFR
Anion gap: 11 (ref 5–15)
BUN: 11 mg/dL (ref 8–23)
CO2: 23 mmol/L (ref 22–32)
Calcium: 7.8 mg/dL — ABNORMAL LOW (ref 8.9–10.3)
Chloride: 92 mmol/L — ABNORMAL LOW (ref 98–111)
Creatinine, Ser: 1.17 mg/dL — ABNORMAL HIGH (ref 0.44–1.00)
GFR, Estimated: 46 mL/min — ABNORMAL LOW (ref >60)
Glucose, Bld: 167 mg/dL — ABNORMAL HIGH (ref 70–99)
Potassium: 3.5 mmol/L (ref 3.5–5.1)
Sodium: 126 mmol/L — ABNORMAL LOW (ref 135–145)

## 2024-01-16 LAB — SODIUM
Sodium: 126 mmol/L — ABNORMAL LOW (ref 135–145)
Sodium: 125 mmol/L — ABNORMAL LOW (ref 135–145)
Sodium: 126 mmol/L — ABNORMAL LOW (ref 135–145)

## 2024-01-16 LAB — STREP PNEUMONIAE URINARY ANTIGEN: Strep Pneumo Urinary Antigen: NEGATIVE

## 2024-01-16 LAB — GLUCOSE, CAPILLARY
Glucose-Capillary: 143 mg/dL — ABNORMAL HIGH (ref 70–99)
Glucose-Capillary: 232 mg/dL — ABNORMAL HIGH (ref 70–99)
Glucose-Capillary: 194 mg/dL — ABNORMAL HIGH (ref 70–99)

## 2024-01-16 LAB — MAGNESIUM: Magnesium: 2.9 mg/dL — ABNORMAL HIGH (ref 1.7–2.4)

## 2024-01-16 LAB — RESP PANEL BY RT-PCR (RSV, FLU A&B, COVID)  RVPGX2
Influenza A by PCR: NEGATIVE
Influenza B by PCR: NEGATIVE
Resp Syncytial Virus by PCR: NEGATIVE
SARS Coronavirus 2 by RT PCR: NEGATIVE

## 2024-01-16 LAB — PROCALCITONIN: Procalcitonin: 0.22 ng/mL

## 2024-01-16 MED ORDER — DOXYCYCLINE HYCLATE 100 MG PO TABS
100.0000 mg | ORAL_TABLET | Freq: Two times a day (BID) | ORAL | Status: DC
  Administered 2024-01-16 (×2): 100 mg via ORAL
  Filled 2024-01-16 (×2): qty 1

## 2024-01-16 MED ORDER — SODIUM CHLORIDE 1 G PO TABS
1.0000 g | ORAL_TABLET | Freq: Two times a day (BID) | ORAL | Status: DC
  Administered 2024-01-16 (×2): 1 g via ORAL
  Filled 2024-01-16 (×2): qty 1

## 2024-01-16 MED ORDER — ORAL CARE MOUTH RINSE: 15.0000 mL | OROMUCOSAL | Status: DC | PRN

## 2024-01-16 MED ORDER — ATORVASTATIN CALCIUM 10 MG PO TABS
10.0000 mg | ORAL_TABLET | Freq: Every day | ORAL | Status: DC
  Administered 2024-01-16 – 2024-01-18 (×3): 10 mg via ORAL
  Filled 2024-01-16 (×3): qty 1

## 2024-01-16 NOTE — Progress Notes (Signed)
 PROGRESS NOTE    Patient: Julie Lutz                            PCP: Christel Cousins, MD                    DOB: [birth year]            DOA: 01/15/2024 WUJ:[id number]             DOS: 01/16/2024, 9:21 AM   LOS: 1 day   Date of Service: The patient was seen and examined on 01/16/2024  Subjective:   The patient was seen and examined this morning. Hemodynamically stable. No issues overnight .  Brief Narrative:   Eanna Kulig is a 83 y.o. 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  Initially has been seen in urgent care treated with a steroid improved however for last 2 days symptom has been worsened developed fever, chills, congestion and shortness of breath.   Daughter at the bedside reported that lately patient has been drinking a lot of water as she continues to cough and complaining of shortness of breath and feels thirsty. Patient is complaining about productive cough, shortness of breath and wheezing.  Denies any fever fever, chill, chest pain, and palpitation.  Denies any difficulty swallowing and sensation of throat closure. No other complaint at this time.   ED Course: Patient was tachycardic, otherwise hemodynamically stable. CMP showing low sodium 122.  (Baseline sodium is around 128-131), elevated creatinine 1.18.- Low magnesium  0.8.  Normal phosphorus level. Low serum osmolarity 259, low urine osmolality 160, and urine sodium 17. CBC showing leukocytosis 13, stable H&H and normal platelet count.   Chest x-ray showed inferior right upper lobe opacity concerning for pneumonia.   Stable cardiomegaly.  Stable linear atelectasis.   Was treated with albuterol  nebulizer, DuoNeb nebulizer and mag sulfate 2 g.   Hospitalist has been consulted and [facility] has been [facility] for the management of hyponatremia, hypomagnesemia and pneumonia.       Assessment & Plan:   Principal Problem:   CAP (community acquired pneumonia) Active Problems:   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   Essential hypertension   Asthma, chronic   History of vocal cord cancer (HCC)   Chronic anemia   Hypomagnesemia   Hyperlipidemia   Asthma, chronic obstructive, with acute exacerbation (HCC)   AKI (acute kidney injury) (HCC)   Acute cystitis  Assessment and Plan: Community-acquired pneumonia- Acute asthma exacerbation - Hemodynamically stable, afebrile, normotensive, satting 94% on room air  POA: worsening fever, chill, cough and shortness of breath.  Treated with oral steroid without much improvement of the symptoms.  -  - Shortness of breath and cough with minimal exertion - Chest x-ray showing evidence of pneumonia. - Checking respiratory panel-influenza A/B/SARS-CoV-2 negative - Positive Coronavirus 229E  - F/up with Blood culture, Sputum culture, urine Legionella antigen, urine strep antigen. - will continue IV ceftriaxone  and doxycycline >>>  - Treating with IV Solu-Medrol. At home patient is not any long-acting bronchodilator.  So starting Breo Ellipta twice daily and continue albuterol  nebulizer as needed.  Continue supportive care. - Pro-Cal 0.22     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- History of SIADH -Patient has previous history of hyponatremia which is multifactorial in the setting of chronic diuretics use, SIADH and chronic PPI use.  Currently patient was on Lasix  at home.   -Baseline serum sodium 128-131.   Sodium level is 122 >>>126   - Low serum Osmo 259, low serum osmolarity 160 and urine sodium 17. This patient has mixed picture  of hyponatremia secondary to SIADH, and use of Lasix . -Currently continuing fluid restriction 1.2 L/day.   Checking serum sodium level every 4 hours.   Goal to improve sodium around 8 to 10 mEq in next 24 hours. -Adding sodium supplement 1 g twice daily 01/16/2024     Prerenal acute kidney injury-setting of pneumonia - Elevated creatinine 1.18.  UA showing normal specific gravity, hazy appearance nitrate positive large leukocyte esterase and few bacteria.  WBC positive. - Prerenal acute kidney injury in the setting of pneumonia.   -Holding IV fluid resuscitation given patient also has hyponatremia and currently on fluid restriction. -Continue to monitor renal function.   Acute cystitis - UA showing evidence of UTI.  Pending urine culture. - Continue to treat with ceftriaxone  both for pneumonia and cystitis.   Hypomagnesemia -Low magnesium  0.8. >> with total of 4g IV >> 2.9 now  Was replaced, will continue to monitor  - Replete electrolyte as needed.   Chronic anemia - Stable H&H continue to monitor.   Hyperlipidemia - Continue Lipitor   History of vocal cord cancer History of dysphonia -Patient follows otolaryngology with [facility].   Essential hypertension -Continue amlodipine .  Holding Lasix  in the setting of hyponatremia     ------------------------------------------------------------------------------------------------------------------------------------- Nutritional status:  The patient's BMI is: Body mass index is 28.92 kg/m. I agree with the assessment and plan as   Cultures; Blood Cultures x 2 >>  Sputum Culture >>   Viral panel influenza A/B/SARS-CoV 19 negative, coronavirus229E positive   -------------------------------------------------------------------------------------------------------------------------------------  DVT prophylaxis:  enoxaparin  (LOVENOX ) injection 30 mg Start: 01/15/24 2200 SCDs Start: 01/15/24 2002 Place TED hose  Start: 01/15/24 2002   Code Status:   Code Status: Full Code  Family Communication: No family member present at bedside- attempt will be made to update daily  -Advance care planning has been discussed.   Admission status:   Status is: Inpatient Remains inpatient appropriate because: Needing IV antibiotics, respiratory support,   Disposition: From  - home             Planning for discharge in 1-2 days: to   Procedures:   No admission procedures for hospital encounter.   Antimicrobials:  Anti-infectives (From admission, onward)    Start     Dose/Rate Route Frequency Ordered Stop   01/17/24 1000  remdesivir 100 mg in sodium chloride  0.9 % 100 mL IVPB  Status:  Discontinued       Placed in "Followed by" Linked Group   100 mg 200 mL/hr over 30 Minutes Intravenous Daily 01/15/24 2310 01/15/24 2324   01/16/24 1200  doxycycline (VIBRA-TABS) tablet 100 mg        100 mg Oral Every 12 hours 01/16/24 0913 01/22/24 2159   01/16/24 0200  nirmatrelvir/ritonavir (renal dosing) (PAXLOVID) 2 tablet  Status:  Discontinued        2 tablet Oral 2 times daily 01/15/24 2330 01/16/24 0116   01/16/24 0100  remdesivir 200 mg in sodium chloride  0.9% 250 mL IVPB  Status:  Discontinued  Placed in "Followed by" Linked Group   200 mg 580 mL/hr over 30 Minutes Intravenous Once 01/15/24 2310 01/15/24 2324   01/15/24 2100  cefTRIAXone  (ROCEPHIN ) 2 g in sodium chloride  0.9 % 100 mL IVPB        2 g 200 mL/hr over 30 Minutes Intravenous Every 24 hours 01/15/24 2001 01/22/24 2059   01/15/24 2100  doxycycline (VIBRAMYCIN) 100 mg in sodium chloride  0.9 % 250 mL IVPB  Status:  Discontinued        100 mg 125 mL/hr over 120 Minutes Intravenous Every 12 hours 01/15/24 2001 01/16/24 0913        Medication:   amLODipine   2.5 mg Oral q morning   atorvastatin   10 mg Oral QHS   carvedilol   25 mg Oral BID WC   doxycycline  100 mg Oral Q12H   enoxaparin  (LOVENOX ) injection  30 mg Subcutaneous Q24H    fluticasone  furoate-vilanterol  1 puff Inhalation Daily   folic acid  1 mg Oral Daily   methylPREDNISolone (SOLU-MEDROL) injection  40 mg Intravenous Daily   mirabegron  ER  50 mg Oral Daily   mirtazapine   7.5 mg Oral QHS   sodium chloride  flush  3 mL Intravenous Q12H   sodium chloride   1 g Oral BID WC    sodium chloride , acetaminophen  **OR** acetaminophen , albuterol , ALPRAZolam , ondansetron  **OR** ondansetron  (ZOFRAN ) IV, mouth rinse, phenol, sodium chloride  flush   Objective:   Vitals:   01/16/24 0105 01/16/24 0539 01/16/24 0816 01/16/24 0859  BP: (!) 108/48 126/64 (!) 127/55   Pulse: 88 83 83   Resp: 18 17 18    Temp: 98.5 F (36.9 C) (!) 97.3 F (36.3 C) (!) 97.5 F (36.4 C)   TempSrc: Oral Oral Oral   SpO2: 92% 94% 94% 94%  Weight:      Height:        Intake/Output Summary (Last 24 hours) at 01/16/2024 1610 Last data filed at 01/16/2024 0600 Gross per 24 hour  Intake 830.02 ml  Output 0 ml  Net 830.02 ml   Filed Weights   01/15/24 1348  Weight: 58.5 kg     Physical examination:   Constitution:  Alert, cooperative, no distress,  Appears calm and comfortable  Psychiatric:   Normal and stable mood and affect, cognition intact,   HEENT:        Normocephalic, PERRL, otherwise with in Normal limits  Chest:         Chest symmetric Cardio vascular:  S1/S2, RRR, No murmure, No Rubs or Gallops  pulmonary: Positive breath sounds diffusely, diffuse rhonchi, with wheezing, mild diminished breath sounds at lower lobes Abdomen: Soft, non-tender, non-distended, bowel sounds,no masses, no organomegaly Muscular skeletal: Limited exam - in bed, able to move all 4 extremities,   Neuro: CNII-XII intact. , normal motor and sensation, reflexes intact  Extremities: No pitting edema lower extremities, +2 pulses  Skin: Dry, warm to touch, negative for any Rashes, No open wounds Wounds: per nursing  documentation   ------------------------------------------------------------------------------------------------------------------------------------------    LABs:     Latest Ref Rng & Units 01/16/2024    5:13 AM 01/15/2024    1:53 PM 10/27/2023   12:06 PM  CBC  WBC 4.0 - 10.5 K/uL 12.8  13.0  7.3   Hemoglobin 12.0 - 15.0 g/dL 96.0  45.4  09.8   Hematocrit 36.0 - 46.0 % 31.2  32.7  33.4   Platelets 150 - 400 K/uL 268  294  327.0  Latest Ref Rng & Units 01/16/2024    5:13 AM 01/16/2024   12:21 AM 01/15/2024    1:53 PM  CMP  Glucose 70 - 99 mg/dL 161   096   BUN 8 - 23 mg/dL 11   12   Creatinine 0.45 - 1.00 mg/dL 4.09   8.11   Sodium 914 - 145 mmol/L 126  126  122   Potassium 3.5 - 5.1 mmol/L 3.5   4.0   Chloride 98 - 111 mmol/L 92   83   CO2 22 - 32 mmol/L 23   25   Calcium  8.9 - 10.3 mg/dL 7.8   8.8   Total Protein 6.5 - 8.1 g/dL   6.7   Total Bilirubin 0.0 - 1.2 mg/dL   0.6   Alkaline Phos 38 - 126 U/L   96   AST 15 - 41 U/L   17   ALT 0 - 44 U/L   14        Micro Results Recent Results (from the past 240 hours)  Respiratory (~20 pathogens) panel by PCR     Status: Abnormal   Collection Time: 01/15/24  8:49 PM   Specimen: Nasopharyngeal Swab; Respiratory  Result Value Ref Range Status   Adenovirus NOT DETECTED NOT DETECTED Final   Coronavirus 229E DETECTED (A) NOT DETECTED Final    Comment: (NOTE) The Coronavirus on the Respiratory Panel, DOES NOT test for the novel  Coronavirus (2019 nCoV)    Coronavirus HKU1 NOT DETECTED NOT DETECTED Final   Coronavirus NL63 NOT DETECTED NOT DETECTED Final   Coronavirus OC43 NOT DETECTED NOT DETECTED Final   Metapneumovirus NOT DETECTED NOT DETECTED Final   Rhinovirus / Enterovirus NOT DETECTED NOT DETECTED Final   Influenza A NOT DETECTED NOT DETECTED Final   Influenza B NOT DETECTED NOT DETECTED Final   Parainfluenza Virus 1 NOT DETECTED NOT DETECTED Final   Parainfluenza Virus 2 NOT DETECTED NOT DETECTED Final    Parainfluenza Virus 3 NOT DETECTED NOT DETECTED Final   Parainfluenza Virus 4 NOT DETECTED NOT DETECTED Final   Respiratory Syncytial Virus NOT DETECTED NOT DETECTED Final   Bordetella pertussis NOT DETECTED NOT DETECTED Final   Bordetella Parapertussis NOT DETECTED NOT DETECTED Final   Chlamydophila pneumoniae NOT DETECTED NOT DETECTED Final   Mycoplasma pneumoniae NOT DETECTED NOT DETECTED Final    Comment: Performed at [facility], [address], [place], [place] [address]  Culture, blood (routine x 2) Call MD if unable to obtain prior to antibiotics being given     Status: None (Preliminary result)   Collection Time: 01/15/24  8:56 PM   Specimen: BLOOD LEFT ARM  Result Value Ref Range Status   Specimen Description BLOOD LEFT ARM  Final   Special Requests   Final    BOTTLES DRAWN AEROBIC AND ANAEROBIC Blood Culture results may not be optimal due to an inadequate volume of blood received in culture bottles   Culture   Final    NO GROWTH < 12 HOURS Performed at [facility], [address], [place], [place] [address]    Report Status PENDING  Incomplete  Culture, blood (routine x 2) Call MD if unable to obtain prior to antibiotics being given     Status: None (Preliminary result)   Collection Time: 01/15/24  8:58 PM   Specimen: BLOOD RIGHT HAND  Result Value Ref Range Status   Specimen Description BLOOD RIGHT HAND  Final   Special Requests  Final    BOTTLES DRAWN AEROBIC AND ANAEROBIC Blood Culture adequate volume   Culture   Final    NO GROWTH < 12 HOURS Performed at [facility], [address], [place], [place] [address]    Report Status PENDING  Incomplete    Radiology Reports DG Chest 2 View Result Date: 01/15/2024 CLINICAL DATA:  Cough and congestion for 3 weeks. EXAM: CHEST - 2 VIEW COMPARISON:  Two-view chest x-ray 04/06/2023. FINDINGS: The heart is enlarged. Atherosclerotic calcifications are present at the aortic arch. Linear atelectasis or scarring  in the left lower lobe is stable. New inferior right upper lobe airspace opacity is present. No significant edema or effusion is present. Exaggerated thoracic kyphosis is again noted. Lumbar fusion is noted. IMPRESSION: 1. New inferior right upper lobe airspace opacity concerning for pneumonia. 2. Stable cardiomegaly without failure. 3. Stable linear atelectasis or scarring in the left lower lobe. Electronically Signed   By: Audree Leas M.D.   On: 01/15/2024 12:42    SIGNED: Bobbetta Burnet, MD, FHM. FAAFP. Arlin Benes - [facility] hospitalist Time spent - 55 min.  In seeing, evaluating and examining the patient. Reviewing medical records, labs, drawn plan of care. [facility],  Pager (please use [email or web address] to page/ text) Please use Epic Secure Chat for non-urgent communication (7AM-7PM)  If 7PM-7AM, please contact night-coverage [email or web address], 01/16/2024, 9:21 AM

## 2024-01-16 NOTE — Hospital Course (Addendum)
 Julie Lutz is a 83 y.o. 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  Initially has been seen in urgent care treated with a steroid improved however for last 2 days symptom has been worsened developed fever, chills, congestion and shortness of breath.   Daughter at the bedside reported that lately patient has been drinking a lot of water as she continues to cough and complaining of shortness of breath and feels thirsty. Patient is complaining about productive cough, shortness of breath and wheezing.  Denies any fever fever, chill, chest pain, and palpitation.  Denies any difficulty swallowing and sensation of throat closure. No other complaint at this time.   ED Course: Patient was tachycardic, otherwise hemodynamically stable. CMP showing low sodium 122.  (Baseline sodium is around 128-131), elevated creatinine 1.18.- Low magnesium  0.8.  Normal phosphorus level. Low serum osmolarity 259, low urine osmolality 160, and urine sodium 17. CBC showing leukocytosis 13, stable H&H and normal platelet count.   Chest x-ray showed inferior right upper lobe opacity concerning for pneumonia.  Stable cardiomegaly.  Stable linear atelectasis.   Was treated with albuterol  nebulizer, DuoNeb nebulizer and mag sulfate 2 g.   Hospitalist has been consulted and [facility] has been [facility] for the management of hyponatremia, hypomagnesemia and pneumonia.

## 2024-01-16 NOTE — Progress Notes (Signed)
   01/16/24 1711  Mobility  Activity Transferred from bed to chair  Level of Assistance Contact guard assist, steadying assist  Assistive Device Front wheel walker  Distance Ambulated (ft) 2 ft  Range of Motion/Exercises Right leg;Left leg;Active  Activity Response Tolerated fair  Mobility Referral Yes  Mobility visit 1 Mobility  Mobility Specialist Start Time (ACUTE ONLY) 1711  Mobility Specialist Stop Time (ACUTE ONLY) 1721  Mobility Specialist Time Calculation (min) (ACUTE ONLY) 10 min   Mobility Specialist: Progress Note  Post-Mobility:    HR 85, SpO2 93% RA  Pt agreeable to mobility session - received in bed. Pt with cough, otherwise no complaints. Further mobility deferred d/t pt about to receive a breathing treatment and eat dinner after. Returned to chair with all needs met - call bell within reach. RN present in room.  Seated BLE exercises (x10): marches, kicks, and heel raises.   Isla Mari, BS Mobility Specialist Please contact via SecureChat or  Rehab office at [phone].

## 2024-01-16 NOTE — Progress Notes (Signed)
 New Admission Note:   Arrival Method: Via [facility] from [facility] Mental Orientation:  A & O x4 Telemetry: Box [id number] Assessment: Completed Skin: Intact IV:  Right Forearm NSL Pain: C/O Sore Throat Tubes:  N/A Safety Measures: Safety Fall Prevention Plan has been given, discussed and signed Admission: Completed 5 MW Orientation: Patient has been orientated to the room, unit and staff.  Family:  Daughter at bedside  Patient normally uses a walker at home.  Currently, she is too short of breath and requests to use a BSC.  She states Purewicks do no work on her.  She has her purse, wallet, and cell phone and charger at bedside.  She was advised about the [employer] and understands.  She does not want anything to go to the hospital safe.  Orders have been reviewed and implemented. Will continue to monitor the patient. Call light has been placed within reach and bed alarm has been activated.   Necia Bali RN- Phone number: [phone]

## 2024-01-16 NOTE — Evaluation (Signed)
 Occupational Therapy Evaluation Patient Details Name: Julie Lutz MRN: [id number] DOB: 18-Apr-1941 Today's Date: 01/16/2024   History of Present Illness   Patient is a 83 yo female presenting to the ED with chills, cough, congestion and SOB on 01/15/24.  Noted to be tachycardic, and admitted with CAP. 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 Patient has a cane and will use when she is feeling more stable. Patient is independent in ADLs, IADLs, drives occasionally, and no falls in the last 6 months. Currently, patient is CGA for bed mobility, transfers, and ADL management. OT anticipating no follow up at discharge, but will continue to follow acutely in order to address functional deficits, decreased endurance, and improve activity tolerance      If plan is discharge home, recommend the following:   A little help with walking and/or transfers;A little help with bathing/dressing/bathroom;Assist for transportation;Help with stairs or ramp for entrance (initially)     Functional Status Assessment   Patient has had a recent decline in their functional status and/or demonstrates limited ability to make significant improvements in function in a reasonable and predictable amount of time     Equipment Recommendations   None recommended by OT     Recommendations for Other Services         Precautions/Restrictions   Precautions Precautions: Fall Restrictions Weight Bearing Restrictions Per Provider Order: No     Mobility Bed Mobility Overal bed mobility: Needs Assistance Bed Mobility: Supine to Sit, Sit to Supine     Supine to  sit: Contact guard Sit to supine: Contact guard assist   General bed mobility comments: minimal increased time    Transfers Overall transfer level: Needs assistance Equipment used: Rolling walker (2 wheels) Transfers: Sit to/from Stand Sit to Stand: Contact guard assist           General transfer comment: CGA for transfers, ambulating to bathroom and back, used grab bars to get off of toilet without cues      Balance Overall balance assessment: Needs assistance Sitting-balance support: No upper extremity supported, Feet supported Sitting balance-Leahy Scale: Good     Standing balance support: Bilateral upper extremity supported, During functional activity, Reliant on assistive device for balance Standing balance-Leahy Scale: Fair Standing balance comment: reliant on RW, but can static stand without                           ADL either performed or assessed with clinical judgement   ADL Overall ADL's : Needs assistance/impaired Eating/Feeding: Set up;Sitting   Grooming: Set up;Sitting   Upper Body Bathing: Contact guard assist;Sitting   Lower Body Bathing: Minimal assistance;Sitting/lateral leans;Sit to/from stand   Upper Body Dressing : Contact guard assist;Sitting   Lower Body Dressing: Minimal assistance;Sitting/lateral leans;Sit to/from stand Lower Body Dressing Details (indicate cue type and reason): has sock aide for RLE since femur fx Toilet Transfer: Contact guard assist;Ambulation;Rolling walker (2 wheels);Grab bars   Toileting- Clothing Manipulation and Hygiene: Contact guard assist;Sitting/lateral lean;Sit to/from stand       Functional mobility during ADLs: Contact guard assist;Rolling walker (2 wheels) General ADL Comments: Prior to this admission, patient living with her daughter (daughter works during the  day) and walks with a rollator. Patient has a cane and will use when she is feeling more stable. Patient is independent in ADLs, IADLs,  drives occasionally, and no falls in the last 6 months. Currently, patient is CGA for bed mobility, transfers, and ADL management. OT anticipating no follow up at discharge, but will continue to follow acutely in order to address functional deficits, decreased endurance, and improve activity tolerance.     Vision Baseline Vision/History: 0 No visual deficits Ability to See in Adequate Light: 0 Adequate Patient Visual Report: No change from baseline Vision Assessment?: No apparent visual deficits     Perception Perception: Within Functional Limits       Praxis Praxis: WFL       Pertinent Vitals/Pain Pain Assessment Pain Assessment: Faces Faces Pain Scale: Hurts a little bit Pain Location: back pain with movement, this is patient's baseline Pain Descriptors / Indicators: Grimacing, Guarding, Discomfort Pain Intervention(s): Limited activity within patient's tolerance, Monitored during session, Repositioned     Extremity/Trunk Assessment Upper Extremity Assessment Upper Extremity Assessment: Generalized weakness;Right hand dominant   Lower Extremity Assessment Lower Extremity Assessment: Generalized weakness;Defer to PT evaluation   Cervical / Trunk Assessment Cervical / Trunk Assessment: Normal   Communication Communication Communication: No apparent difficulties   Cognition Arousal: Alert Behavior During Therapy: WFL for tasks assessed/performed Cognition: No apparent impairments             OT - Cognition Comments: Retired Charity fundraiser, no deficits noted                 Following commands: Intact       Cueing  General Comments   Cueing Techniques: Verbal cues  VSS on RA   Exercises     Shoulder Instructions      Home Living Family/patient expects to be discharged to:: Private residence Living Arrangements: Children Available Help at Discharge: Available PRN/intermittently Type of Home: House Home Access: Stairs to enter [employer] of  Steps: 2 Entrance Stairs-Rails: Left Home Layout: Two level;Able to live on main level with bedroom/bathroom Alternate Level Stairs-Number of Steps: 14   Bathroom Shower/Tub: Producer, television/film/video: Handicapped height     Home Equipment: Rollator (4 wheels);Shower seat;BSC/3in1;Cane - single point;Grab bars - tub/shower;Grab bars - toilet          Prior Functioning/Environment Prior Level of Function : Independent/Modified Independent;Driving             Mobility Comments: uses a rollator, but will use a cane when she feels better ADLs Comments: drives intermittently, independent in ADLs and IADLs, manages her own medications    OT Problem List: Decreased activity tolerance;Impaired balance (sitting and/or standing);Decreased strength   OT Treatment/Interventions: Therapeutic exercise;Self-care/ADL training;Energy conservation;Patient/family education;Balance training      OT Goals(Current goals can be found in the care plan section)   Acute Rehab OT Goals Patient Stated Goal: to get better OT Goal Formulation: With patient Time For Goal Achievement: 01/30/24 Potential to Achieve Goals: Good   OT Frequency:  Min 1X/week    Co-evaluation              AM-PAC OT "6 Clicks" Daily Activity     Outcome Measure Help from another person eating meals?: A Little Help from another person taking care of personal grooming?: A Little Help from another person toileting, which includes using toliet, bedpan, or urinal?: A Little Help from another person bathing (including washing, rinsing, drying)?: A Little Help from another  person to put on and taking off regular upper body clothing?: A Little Help from another person to put on and taking off regular lower body clothing?: A Little 6 Click Score: 18   End of Session Equipment Utilized During Treatment: Rolling walker (2 wheels) Nurse Communication: Mobility status  Activity Tolerance: Patient tolerated  treatment well Patient left: in bed;with call bell/phone within reach;with nursing/sitter in room (sitting EOB with lunch)  OT Visit Diagnosis: Unsteadiness on feet (R26.81);Other abnormalities of gait and mobility (R26.89);Muscle weakness (generalized) (M62.81)                Time: 1610-9604 OT Time Calculation (min): 18 min Charges:  OT General Charges $OT Visit: 1 Visit OT Evaluation $OT Eval Moderate Complexity: 1 Mod  Mollie Anger E. Edrick Whitehorn, OTR/L Acute Rehabilitation Services [phone]   Vincent Greek 01/16/2024, 1:29 PM

## 2024-01-16 NOTE — Plan of Care (Signed)
   Problem: Nutrition: Goal: Adequate nutrition will be maintained Outcome: Progressing   Problem: Coping: Goal: Level of anxiety will decrease Outcome: Progressing   Problem: Elimination: Goal: Will not experience complications related to bowel motility Outcome: Progressing Goal: Will not experience complications related to urinary retention Outcome: Progressing

## 2024-01-17 ENCOUNTER — Encounter (HOSPITAL_COMMUNITY): Payer: Self-pay | Admitting: Family Medicine

## 2024-01-17 DIAGNOSIS — E871 Hypo-osmolality and hyponatremia: Secondary | ICD-10-CM | POA: Diagnosis not present

## 2024-01-17 DIAGNOSIS — R739 Hyperglycemia, unspecified: Secondary | ICD-10-CM | POA: Insufficient documentation

## 2024-01-17 DIAGNOSIS — J189 Pneumonia, unspecified organism: Secondary | ICD-10-CM | POA: Diagnosis not present

## 2024-01-17 LAB — BASIC METABOLIC PANEL WITH GFR
Anion gap: 9 (ref 5–15)
BUN: 12 mg/dL (ref 8–23)
CO2: 24 mmol/L (ref 22–32)
Calcium: 7.6 mg/dL — ABNORMAL LOW (ref 8.9–10.3)
Chloride: 92 mmol/L — ABNORMAL LOW (ref 98–111)
Creatinine, Ser: 0.95 mg/dL (ref 0.44–1.00)
GFR, Estimated: 59 mL/min — ABNORMAL LOW (ref >60)
Glucose, Bld: 141 mg/dL — ABNORMAL HIGH (ref 70–99)
Potassium: 3.4 mmol/L — ABNORMAL LOW (ref 3.5–5.1)
Sodium: 125 mmol/L — ABNORMAL LOW (ref 135–145)

## 2024-01-17 LAB — CBC
HCT: 29.1 % — ABNORMAL LOW (ref 36.0–46.0)
Hemoglobin: 9.9 g/dL — ABNORMAL LOW (ref 12.0–15.0)
MCH: 29.6 pg (ref 26.0–34.0)
MCHC: 34 g/dL (ref 30.0–36.0)
MCV: 86.9 fL (ref 80.0–100.0)
Platelets: 316 10*3/uL (ref 150–400)
RBC: 3.35 MIL/uL — ABNORMAL LOW (ref 3.87–5.11)
RDW: 13.2 % (ref 11.5–15.5)
WBC: 13.5 10*3/uL — ABNORMAL HIGH (ref 4.0–10.5)
nRBC: 0 % (ref 0.0–0.2)

## 2024-01-17 LAB — GLUCOSE, CAPILLARY
Glucose-Capillary: 138 mg/dL — ABNORMAL HIGH (ref 70–99)
Glucose-Capillary: 152 mg/dL — ABNORMAL HIGH (ref 70–99)
Glucose-Capillary: 156 mg/dL — ABNORMAL HIGH (ref 70–99)
Glucose-Capillary: 137 mg/dL — ABNORMAL HIGH (ref 70–99)
Glucose-Capillary: 153 mg/dL — ABNORMAL HIGH (ref 70–99)

## 2024-01-17 LAB — HEMOGLOBIN A1C
Hgb A1c MFr Bld: 5.9 % — ABNORMAL HIGH (ref 4.8–5.6)
Mean Plasma Glucose: 122.63 mg/dL

## 2024-01-17 LAB — SODIUM: Sodium: 125 mmol/L — ABNORMAL LOW (ref 135–145)

## 2024-01-17 MED ORDER — PANTOPRAZOLE SODIUM 40 MG PO TBEC
40.0000 mg | DELAYED_RELEASE_TABLET | Freq: Every day | ORAL | Status: DC
  Administered 2024-01-17: 40 mg via ORAL
  Filled 2024-01-17: qty 1

## 2024-01-17 MED ORDER — INSULIN ASPART 100 UNIT/ML IJ SOLN
0.0000 [IU] | Freq: Three times a day (TID) | INTRAMUSCULAR | Status: DC
  Administered 2024-01-17: 2 [IU] via SUBCUTANEOUS
  Administered 2024-01-18: 1 [IU] via SUBCUTANEOUS
  Administered 2024-01-18: 2 [IU] via SUBCUTANEOUS

## 2024-01-17 MED ORDER — LEVOFLOXACIN 500 MG PO TABS
750.0000 mg | ORAL_TABLET | ORAL | Status: DC
  Filled 2024-01-17: qty 2

## 2024-01-17 MED ORDER — GUAIFENESIN-DM 100-10 MG/5ML PO SYRP
15.0000 mL | ORAL_SOLUTION | Freq: Three times a day (TID) | ORAL | Status: DC
Start: 2024-01-17 — End: 2024-01-17

## 2024-01-17 MED ORDER — SODIUM CHLORIDE 1 G PO TABS
1.0000 g | ORAL_TABLET | Freq: Three times a day (TID) | ORAL | Status: DC
  Administered 2024-01-17 – 2024-01-19 (×6): 1 g via ORAL
  Filled 2024-01-17 (×6): qty 1

## 2024-01-17 MED ORDER — GUAIFENESIN-DM 100-10 MG/5ML PO SYRP: 15.0000 mL | ORAL_SOLUTION | Freq: Three times a day (TID) | ORAL | Status: DC | PRN

## 2024-01-17 MED ORDER — ALUM & MAG HYDROXIDE-SIMETH 200-200-20 MG/5ML PO SUSP
15.0000 mL | Freq: Three times a day (TID) | ORAL | Status: DC
  Administered 2024-01-18: 15 mL via ORAL
  Filled 2024-01-17 (×4): qty 30

## 2024-01-17 MED ORDER — GUAIFENESIN-DM 100-10 MG/5ML PO SYRP
15.0000 mL | ORAL_SOLUTION | Freq: Three times a day (TID) | ORAL | Status: DC
  Administered 2024-01-17: 15 mL via ORAL
  Filled 2024-01-17: qty 15

## 2024-01-17 MED ORDER — METHYLPREDNISOLONE SODIUM SUCC 40 MG IJ SOLR
40.0000 mg | Freq: Every day | INTRAMUSCULAR | Status: AC
  Administered 2024-01-18: 40 mg via INTRAVENOUS
  Filled 2024-01-17: qty 1

## 2024-01-17 MED ORDER — POTASSIUM CHLORIDE CRYS ER 20 MEQ PO TBCR
40.0000 meq | EXTENDED_RELEASE_TABLET | Freq: Once | ORAL | Status: AC
  Administered 2024-01-17: 40 meq via ORAL
  Filled 2024-01-17: qty 2

## 2024-01-17 MED ORDER — LEVOFLOXACIN 750 MG PO TABS
750.0000 mg | ORAL_TABLET | ORAL | Status: DC
Start: 2024-01-17 — End: 2024-01-22
  Administered 2024-01-17 – 2024-01-18 (×2): 750 mg via ORAL
  Filled 2024-01-17: qty 2
  Filled 2024-01-17 (×2): qty 1
  Filled 2024-01-17: qty 2

## 2024-01-17 NOTE — Progress Notes (Signed)
 PROGRESS NOTE    Patient: Julie Lutz                            PCP: Christel Cousins, MD                    DOB: [birth year]            DOA: 01/15/2024 ZOX:[id number]             DOS: 01/17/2024, 11:08 AM   LOS: 2 days   Date of Service: The patient was seen and examined on 01/17/2024  Subjective:   The patient was seen and examined this morning, complained of generalized weaknesses, improved cough, shortness of breath with minimal exertion.  Brief Narrative:   Julie Lutz is a 83 y.o. 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  Initially has been seen in urgent care treated with a steroid improved however for last 2 days symptom has been worsened developed fever, chills, congestion and shortness of breath.   Daughter at the bedside reported that lately patient has been drinking a lot of water as she continues to cough and complaining of shortness of breath and feels thirsty. Patient is complaining about productive cough, shortness of breath and wheezing.  Denies any fever fever, chill, chest pain, and palpitation.  Denies any difficulty swallowing and sensation of throat closure. No other complaint at this time.   ED Course: Patient was tachycardic, otherwise hemodynamically stable. CMP showing low sodium 122.  (Baseline sodium is around 128-131), elevated creatinine 1.18.- Low magnesium  0.8.  Normal phosphorus level. Low serum osmolarity 259, low urine osmolality 160, and urine sodium 17. CBC showing leukocytosis 13, stable H&H and normal platelet count.   Chest x-ray showed inferior  right upper lobe opacity concerning for pneumonia.  Stable cardiomegaly.  Stable linear atelectasis.   Was treated with albuterol  nebulizer, DuoNeb nebulizer and mag sulfate 2 g.   Hospitalist has been consulted and patient has been [facility] for the management of hyponatremia, hypomagnesemia and pneumonia.       Assessment & Plan:   Principal Problem:   CAP (community acquired pneumonia) Active Problems:   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   Essential hypertension   Asthma, chronic   History of vocal cord cancer (HCC)   Chronic anemia   Hypomagnesemia   Hyperlipidemia   Asthma, chronic obstructive, with acute exacerbation (HCC)   AKI (acute kidney injury) (HCC)   Acute cystitis  Assessment and Plan: Community-acquired pneumonia- Acute asthma exacerbation - Still complaining of shortness of breath with exertion, Improved cough, hemodynamically stable.  POA: worsening fever, chill, cough and shortness of breath.  Treated with oral steroid without much improvement of the symptoms.  - Influenza A/B, SARS-CoV-2 negative - Chest x-ray showing evidence of pneumonia.  - Positive Coronavirus 229E  - F/up with Blood culture, Sputum culture, urine Legionella antigen, urine strep antigen. - will continue IV ceftriaxone  and doxycycline >>> switch to p.o. Levaquin - Treating with IV Solu-Medrol.  Switching to p.o. prednisone  taper. At home patient is not any long-acting bronchodilator.  Will continue Breo Ellipta twice  daily and continue albuterol  nebulizer as needed.  Continue supportive care. - Pro-Cal 0.22     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- History of SIADH -Patient has previous history of hyponatremia which is multifactorial in the setting of chronic diuretics use, SIADH and chronic PPI use.  Currently patient was on Lasix  at home.   -Baseline serum sodium 128-131.   Sodium level is 122 >>>126 >>> 125  - Low serum Osmo 259, low serum  osmolarity 160 and urine sodium 17. This patient has mixed picture of hyponatremia secondary to SIADH, and use of Lasix . -Currently continuing fluid restriction 1.2 L/day.   Checking serum sodium levels    -Sodium supplement 1 g increase to 3 times daily on 01/17/2024     Prerenal acute kidney injury-setting of pneumonia Lab Results  Component Value Date   CREATININE 0.95 01/17/2024   CREATININE 1.17 (H) 01/16/2024   CREATININE 1.18 (H) 01/15/2024   -Monitoring closely - UA showing normal specific gravity, hazy appearance nitrate positive large leukocyte esterase and few bacteria.  WBC positive. - Prerenal acute kidney injury in the setting of pneumonia.   -Holding IV fluid for now (in setting of pneumonia, hyponatremia) -Continue to monitor renal function.   Acute cystitis - UA showing evidence of UTI.  Pending urine culture. - Was on ceftriaxone  both for pneumonia and cystitis >>> switching to p.o. Levaquin   Hypomagnesemia -Low magnesium  0.8. >> with total of 4g IV >> 2.9 now  Was replaced, will continue to monitor  - Replete electrolyte as needed.   Chronic anemia - Stable H&H continue to monitor.   Hyperlipidemia - Continue Lipitor   History of vocal cord cancer History of dysphonia -Patient follows otolaryngology with [facility].   Hyperglycemia -prediabetic Transient, due to IV steroids-no history of DM II Monitoring CBGs closely, with Isai coverage A1c 5.9   Essential hypertension -Continue amlodipine .  Holding Lasix  in the setting of hyponatremia     ------------------------------------------------------------------------------------------------------------------------------------- Nutritional status:  The patient's BMI is: Body mass index is 28.92 kg/m. I agree with the assessment and plan as   Cultures; Blood Cultures x 2 >> no growth to date Urine culture-no growth to date  Viral panel influenza A/B/SARS-CoV 19 negative,  coronavirus229E positive   -------------------------------------------------------------------------------------------------------------------------------------  DVT prophylaxis:  enoxaparin  (LOVENOX ) injection 30 mg Start: 01/15/24 2200 SCDs Start: 01/15/24 2002 Place TED hose Start: 01/15/24 2002   Code Status:   Code Status: Full Code  Family Communication: No family member present at bedside- attempt will be made to update daily  -Advance care planning has been discussed.   Admission status:   Status is: Inpatient Remains inpatient appropriate because: Needing IV antibiotics, respiratory support,   Disposition: From  - home             Planning for discharge in 1-2 days: to   Procedures:   No admission procedures for hospital encounter.   Antimicrobials:  Anti-infectives (From admission, onward)    Start     Dose/Rate Route Frequency Ordered Stop   01/17/24 1000  remdesivir 100 mg in sodium chloride  0.9 % 100 mL IVPB  Status:  Discontinued       Placed in "Followed by" Linked Group   100 mg 200 mL/hr over 30 Minutes Intravenous Daily 01/15/24 2310 01/15/24 2324   01/17/24 0844  levofloxacin (LEVAQUIN) tablet 750 mg        750 mg Oral Every 48 hours 01/17/24 0844 01/22/24 1159   01/17/24 0815  levofloxacin (LEVAQUIN) tablet  750 mg  Status:  Discontinued        750 mg Oral Every 48 hours 01/17/24 0740 01/17/24 0844   01/16/24 1200  doxycycline (VIBRA-TABS) tablet 100 mg  Status:  Discontinued        100 mg Oral Every 12 hours 01/16/24 0913 01/17/24 0740   01/16/24 0200  nirmatrelvir/ritonavir (renal dosing) (PAXLOVID) 2 tablet  Status:  Discontinued        2 tablet Oral 2 times daily 01/15/24 2330 01/16/24 0116   01/16/24 0100  remdesivir 200 mg in sodium chloride  0.9% 250 mL IVPB  Status:  Discontinued       Placed in "Followed by" Linked Group   200 mg 580 mL/hr over 30 Minutes Intravenous Once 01/15/24 2310 01/15/24 2324   01/15/24 2100  cefTRIAXone  (ROCEPHIN ) 2  g in sodium chloride  0.9 % 100 mL IVPB  Status:  Discontinued        2 g 200 mL/hr over 30 Minutes Intravenous Every 24 hours 01/15/24 2001 01/17/24 0740   01/15/24 2100  doxycycline (VIBRAMYCIN) 100 mg in sodium chloride  0.9 % 250 mL IVPB  Status:  Discontinued        100 mg 125 mL/hr over 120 Minutes Intravenous Every 12 hours 01/15/24 2001 01/16/24 0913        Medication:   alum & mag hydroxide-simeth  15 mL Oral Q8H   amLODipine   2.5 mg Oral q morning   atorvastatin   10 mg Oral QHS   carvedilol   25 mg Oral BID WC   enoxaparin  (LOVENOX ) injection  30 mg Subcutaneous Q24H   fluticasone  furoate-vilanterol  1 puff Inhalation Daily   folic acid  1 mg Oral Daily   guaiFENesin-dextromethorphan  15 mL Oral Q8H   insulin aspart  0-9 Units Subcutaneous TID WC   levofloxacin  750 mg Oral Q48H   [START ON 01/18/2024] methylPREDNISolone (SOLU-MEDROL) injection  40 mg Intravenous Daily   mirabegron  ER  50 mg Oral Daily   mirtazapine   7.5 mg Oral QHS   sodium chloride  flush  3 mL Intravenous Q12H   sodium chloride   1 g Oral TID WC    acetaminophen  **OR** acetaminophen , albuterol , ALPRAZolam , ondansetron  **OR** ondansetron  (ZOFRAN ) IV, mouth rinse, phenol, sodium chloride  flush   Objective:   Vitals:   01/16/24 1601 01/16/24 2103 01/17/24 0500 01/17/24 0757  BP: 104/67 100/74 (!) 113/47 (!) 140/70  Pulse: 80 81 83 87  Resp: 17 18  18   Temp: 97.6 F (36.4 C) 98.2 F (36.8 C) 98.2 F (36.8 C) 97.7 F (36.5 C)  TempSrc: Oral  Oral Oral  SpO2: 95% 94% 96% 100%  Weight:      Height:        Intake/Output Summary (Last 24 hours) at 01/17/2024 1108 Last data filed at 01/17/2024 0859 Gross per 24 hour  Intake 460 ml  Output 0 ml  Net 460 ml   Filed Weights   01/15/24 1348  Weight: 58.5 kg     Physical examination:        General:  AAO x 3,  cooperative, no distress; shortness of breath with exertion  HEENT:  Normocephalic, PERRL, otherwise with in Normal limits   Neuro:   CNII-XII intact. , normal motor and sensation, reflexes intact   Lungs:   Positive breath sounds diffusely, improved wheezing, rhonchi Diminished breath sounds lower lobes, right greater than left, Negative for any crackles  Cardio:    S1/S2, RRR, No murmure, No Rubs or Gallops  Abdomen:  Soft, non-tender, bowel sounds active all four quadrants, no guarding or peritoneal signs.  Muscular  skeletal:  Limited exam -global generalized weaknesses - in bed, able to move all 4 extremities,   2+ pulses,  symmetric, No pitting edema  Skin:  Dry, warm to touch, negative for any Rashes,  Wounds: Please see nursing documentation          ------------------------------------------------------------------------------------------------------------------------------------------    LABs:     Latest Ref Rng & Units 01/17/2024    4:50 AM 01/16/2024    5:13 AM 01/15/2024    1:53 PM  CBC  WBC 4.0 - 10.5 K/uL 13.5  12.8  13.0   Hemoglobin 12.0 - 15.0 g/dL 9.9  08.6  57.8   Hematocrit 36.0 - 46.0 % 29.1  31.2  32.7   Platelets 150 - 400 K/uL 316  268  294       Latest Ref Rng & Units 01/17/2024    4:50 AM 01/17/2024   12:35 AM 01/16/2024    9:16 PM  CMP  Glucose 70 - 99 mg/dL 469     BUN 8 - 23 mg/dL 12     Creatinine 6.29 - 1.00 mg/dL 5.28     Sodium 413 - 244 mmol/L 125  125  126   Potassium 3.5 - 5.1 mmol/L 3.4     Chloride 98 - 111 mmol/L 92     CO2 22 - 32 mmol/L 24     Calcium  8.9 - 10.3 mg/dL 7.6          Micro Results Recent Results (from the past 240 hours)  Respiratory (~20 pathogens) panel by PCR     Status: Abnormal   Collection Time: 01/15/24  8:49 PM   Specimen: Nasopharyngeal Swab; Respiratory  Result Value Ref Range Status   Adenovirus NOT DETECTED NOT DETECTED Final   Coronavirus 229E DETECTED (A) NOT DETECTED Final    Comment: (NOTE) The Coronavirus on the Respiratory Panel, DOES NOT test for the novel  Coronavirus (2019 nCoV)    Coronavirus HKU1 NOT DETECTED  NOT DETECTED Final   Coronavirus NL63 NOT DETECTED NOT DETECTED Final   Coronavirus OC43 NOT DETECTED NOT DETECTED Final   Metapneumovirus NOT DETECTED NOT DETECTED Final   Rhinovirus / Enterovirus NOT DETECTED NOT DETECTED Final   Influenza A NOT DETECTED NOT DETECTED Final   Influenza B NOT DETECTED NOT DETECTED Final   Parainfluenza Virus 1 NOT DETECTED NOT DETECTED Final   Parainfluenza Virus 2 NOT DETECTED NOT DETECTED Final   Parainfluenza Virus 3 NOT DETECTED NOT DETECTED Final   Parainfluenza Virus 4 NOT DETECTED NOT DETECTED Final   Respiratory Syncytial Virus NOT DETECTED NOT DETECTED Final   Bordetella pertussis NOT DETECTED NOT DETECTED Final   Bordetella Parapertussis NOT DETECTED NOT DETECTED Final   Chlamydophila pneumoniae NOT DETECTED NOT DETECTED Final   Mycoplasma pneumoniae NOT DETECTED NOT DETECTED Final    Comment: Performed at [facility], [address], [place], [place] [address]  Culture, blood (routine x 2) Call MD if unable to obtain prior to antibiotics being given     Status: None (Preliminary result)   Collection Time: 01/15/24  8:56 PM   Specimen: BLOOD LEFT ARM  Result Value Ref Range Status   Specimen Description BLOOD LEFT ARM  Final   Special Requests   Final    BOTTLES DRAWN AEROBIC AND ANAEROBIC Blood Culture results may not be optimal due to an inadequate volume of blood received  in culture bottles   Culture   Final    NO GROWTH 2 DAYS Performed at [facility], [address] [place], [place] [address]    Report Status PENDING  Incomplete  Culture, blood (routine x 2) Call MD if unable to obtain prior to antibiotics being given     Status: None (Preliminary result)   Collection Time: 01/15/24  8:58 PM   Specimen: BLOOD RIGHT HAND  Result Value Ref Range Status   Specimen Description BLOOD RIGHT HAND  Final   Special Requests   Final    BOTTLES DRAWN AEROBIC AND ANAEROBIC Blood Culture adequate volume   Culture   Final    NO  GROWTH 2 DAYS Performed at [facility], [address] [place], [place] [address]    Report Status PENDING  Incomplete  Resp panel by RT-PCR (RSV, Flu A&B, Covid) Anterior Nasal Swab     Status: None   Collection Time: 01/16/24 12:48 PM   Specimen: Anterior Nasal Swab  Result Value Ref Range Status   SARS Coronavirus 2 by RT PCR NEGATIVE NEGATIVE Final   Influenza A by PCR NEGATIVE NEGATIVE Final   Influenza B by PCR NEGATIVE NEGATIVE Final    Comment: (NOTE) The Xpert Xpress SARS-CoV-2/FLU/RSV plus assay is intended as an aid in the diagnosis of influenza from Nasopharyngeal swab specimens and should not be used as a sole basis for treatment. Nasal washings and aspirates are unacceptable for Xpert Xpress SARS-CoV-2/FLU/RSV testing.  Fact Sheet for Patients: BloggerCourse.com  Fact Sheet for Healthcare Providers: SeriousBroker.it  This test is not yet approved or cleared by the [place]  FDA and has been authorized for detection and/or diagnosis of SARS-CoV-2 by FDA under an Emergency Use Authorization (EUA). This EUA will remain in effect (meaning this test can be used) for the duration of the COVID-19 declaration under Section 564(b)(1) of the Act, 21 U.S.C. section 360bbb-3(b)(1), unless the authorization is terminated or revoked.     Resp Syncytial Virus by PCR NEGATIVE NEGATIVE Final    Comment: (NOTE) Fact Sheet for Patients: BloggerCourse.com  Fact Sheet for Healthcare Providers: SeriousBroker.it  This test is not yet approved or cleared by the [place]  FDA and has been authorized for detection and/or diagnosis of SARS-CoV-2 by FDA under an Emergency Use Authorization (EUA). This EUA will remain in effect (meaning this test can be used) for the duration of the COVID-19 declaration under Section 564(b)(1) of the Act, 21 U.S.C. section 360bbb-3(b)(1),  unless the authorization is terminated or revoked.  Performed at [facility], [address] [place], [place] [address]     Radiology Reports No results found.   SIGNED: Bobbetta Burnet, MD, FHM. FAAFP. Arlin Benes - [facility] hospitalist Time spent - 55 min.  In seeing, evaluating and examining the patient. Reviewing medical records, labs, drawn plan of care. [facility],  Pager (please use [email or web address] to page/ text) Please use Epic Secure Chat for non-urgent communication (7AM-7PM)  If 7PM-7AM, please contact night-coverage [email or web address], 01/17/2024, 11:08 AM

## 2024-01-17 NOTE — Progress Notes (Signed)
 Psychosocial Progressive/Outcome: ANOx4, calm and cooperative  Family at bedside   Pain/Comfort Progression/Outcome: Pt did not complain of pain during shift   Clinical Progression/Outcome: Adequate fluid and diet intake Independent in positioning Worked w/ PT  SBA w/ walker  Voids to BR as needed  No BM reported per pt  Slept in between care Maintained safety

## 2024-01-17 NOTE — Consult Note (Signed)
 Reason for Consult:Hyponatremia Referring Physician: Dr. Eilene Grater  Chief Complaint: cough  Assessment/Plan:  Hyponatremia -on examination she appears to be volume down with poor skin turgor and no edema.  Urine sodium 17 and urine osmolality 160 because patient had continued to take Lasix  at home.  Her sodium usually drops when she has an acute illness which is not surprising with pulmonary symptoms. 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  -Will also check a TSH and cortisol level. - Will trend and follow closely with you; started on sodium chloride  tablets TID long-term wise does not need to be on. Pneumonia currently on Levaquin with a positive coronavirus 229E. Anemia - stable H/o vocal cord cancer HTN - on amlodipine    HPI: Julie Lutz is an 83 y.o. female history of hypertension, hyperlipidemia, chronic hypomagnesemia, iron deficiency anemia, osteoporosis, vocal cord cancer, chronic hyponatremia followed by Dr. Christianne Cowper.  Patient is presenting with a cough for 2 weeks initially seen in the urgent care and given prednisone .  Patient has had a cough intermittently productive, but denies any chills, myalgias, sore throat.  Patient has had a poor appetite for the past few days.  Patient has continued to take Lasix  for the hyponatremia but is not on any sodium chloride  tablets.  Does complain of thirst but has been drinking a lot of water at home with poor solid intake.  Patient was found to be tachycardic in the ER with a sodium initially 122 baseline 128-131 and a creatinine of 1.18.  Urine osmolality was 160 and urine sodium was 17.  X-ray showed a inferior right upper lobe opacity and patient treated for pneumonia.  ROS Pertinent items are noted in HPI.  Chemistry and CBC: Creat  Date/Time Value Ref Range Status  11/08/2023 03:47 PM 1.09 (H) 0.60 - 0.95 mg/dL Final   Creatinine, Ser  Date/Time Value Ref Range Status   01/17/2024 04:50 AM 0.95 0.44 - 1.00 mg/dL Final  16/06/9603 54:09 AM 1.17 (H) 0.44 - 1.00 mg/dL Final  81/19/1478 29:56 PM 1.18 (H) 0.44 - 1.00 mg/dL Final  21/30/8657 84:69 PM 1.11 0.40 - 1.20 mg/dL Final  62/95/2841 32:44 AM 1.12 0.40 - 1.20 mg/dL Final  09/22/7251 66:44 AM 1.12 0.40 - 1.20 mg/dL Final  03/47/4259 56:38 PM 0.95 0.40 - 1.20 mg/dL Final  75/64/3329 51:88 PM 0.92 0.44 - 1.00 mg/dL Final  41/66/0630 16:01 PM 1.05 0.40 - 1.20 mg/dL Final  09/32/3557 32:20 AM 0.92 0.40 - 1.20 mg/dL Final  25/42/7062 37:62 AM 0.85 0.44 - 1.00 mg/dL Final  83/15/1761 60:73 PM 0.88 0.40 - 1.20 mg/dL Final  71/02/2693 85:46 AM 1.01 0.40 - 1.20 mg/dL Final  27/11/5007 38:18 AM 1.00 0.40 - 1.20 mg/dL Final  29/93/7169 67:89 AM 1.03 (H) 0.57 - 1.00 mg/dL Final  38/06/1750 02:58 PM 0.95 0.40 - 1.20 mg/dL Final  52/77/8242 35:36 AM 0.90 0.44 - 1.00 mg/dL Final  14/43/1540 08:67 AM 0.93 0.44 - 1.00 mg/dL Final  61/95/0932 67:12 PM 1.29 (H) 0.44 - 1.00 mg/dL Final   Recent Labs  Lab 01/15/24 1353 01/16/24 0021 01/16/24 0513 01/16/24 1323 01/16/24 2116 01/17/24 0035 01/17/24 0450  NA 122* 126* 126* 125* 126* 125* 125*  K 4.0  --  3.5  --   --   --  3.4*  CL 83*  --  92*  --   --   --  92*  CO2 25  --  23  --   --   --  24  GLUCOSE 108*  --  167*  --   --   --  141*  BUN 12  --  11  --   --   --  12  CREATININE 1.18*  --  1.17*  --   --   --  0.95  CALCIUM  8.8*  --  7.8*  --   --   --  7.6*  PHOS 2.7  --   --   --   --   --   --    Recent Labs  Lab 01/15/24 1353 01/16/24 0513 01/17/24 0450  WBC 13.0* 12.8* 13.5*  NEUTROABS 10.8*  --   --   HGB 11.0* 10.5* 9.9*  HCT 32.7* 31.2* 29.1*  MCV 86.3 87.2 86.9  PLT 294 268 316   Liver Function Tests: Recent Labs  Lab 01/15/24 1353  AST 17  ALT 14  ALKPHOS 96  BILITOT 0.6  PROT 6.7  ALBUMIN 3.8   No results for input(s): "LIPASE", "AMYLASE" in the last 168 hours. No results for input(s): "AMMONIA" in the last 168  hours. Cardiac Enzymes: No results for input(s): "CKTOTAL", "CKMB", "CKMBINDEX", "TROPONINI" in the last 168 hours. Iron Studies: No results for input(s): "IRON", "TIBC", "TRANSFERRIN", "FERRITIN" in the last 72 hours. PT/INR: @LABRCNTIP (inr:5)  Xrays/Other Studies: ) Results for orders placed or performed during the hospital encounter of 01/15/24 (from the past 48 hours)  CBC with Differential     Status: Abnormal   Collection Time: 01/15/24  1:53 PM  Result Value Ref Range   WBC 13.0 (H) 4.0 - 10.5 K/uL   RBC 3.79 (L) 3.87 - 5.11 MIL/uL   Hemoglobin 11.0 (L) 12.0 - 15.0 g/dL   HCT 16.1 (L) 09.6 - 04.5 %   MCV 86.3 80.0 - 100.0 fL   MCH 29.0 26.0 - 34.0 pg   MCHC 33.6 30.0 - 36.0 g/dL   RDW 40.9 81.1 - 91.4 %   Platelets 294 150 - 400 K/uL   nRBC 0.0 0.0 - 0.2 %   Neutrophils Relative % 82 %   Neutro Abs 10.8 (H) 1.7 - 7.7 K/uL   Lymphocytes Relative 8 %   Lymphs Abs 1.0 0.7 - 4.0 K/uL   Monocytes Relative 9 %   Monocytes Absolute 1.1 (H) 0.1 - 1.0 K/uL   Eosinophils Relative 0 %   Eosinophils Absolute 0.0 0.0 - 0.5 K/uL   Basophils Relative 0 %   Basophils Absolute 0.0 0.0 - 0.1 K/uL   Immature Granulocytes 1 %   Abs Immature Granulocytes 0.07 0.00 - 0.07 K/uL    Comment: Performed at [facility], [address] [place], [place] [address]  Comprehensive metabolic panel     Status: Abnormal   Collection Time: 01/15/24  1:53 PM  Result Value Ref Range   Sodium 122 (L) 135 - 145 mmol/L   Potassium 4.0 3.5 - 5.1 mmol/L   Chloride 83 (L) 98 - 111 mmol/L   CO2 25 22 - 32 mmol/L   Glucose, Bld 108 (H) 70 - 99 mg/dL    Comment: Glucose reference range applies only to samples taken after fasting for at least 8 hours.   BUN 12 8 - 23 mg/dL   Creatinine, Ser 6.21 (H) 0.44 - 1.00 mg/dL   Calcium  8.8 (L) 8.9 - 10.3 mg/dL   Total Protein 6.7 6.5 - 8.1 g/dL   Albumin 3.8 3.5 - 5.0 g/dL   AST 17 15 - 41 U/L   ALT  14 0 - 44 U/L   Alkaline Phosphatase 96 38 -  126 U/L   Total Bilirubin 0.6 0.0 - 1.2 mg/dL   GFR, Estimated 45 (L) >60 mL/min    Comment: (NOTE) Calculated using the CKD-EPI Creatinine Equation (2021)    Anion gap 14 5 - 15    Comment: Performed at [facility], [address] [place], [place] [address]  Magnesium      Status: Abnormal   Collection Time: 01/15/24  1:53 PM  Result Value Ref Range   Magnesium  0.8 (LL) 1.7 - 2.4 mg/dL    Comment: Critical Value, Read Back and verified with MAS,C@1545  BY MATTHEWS, B 4.27.2025 Performed at [facility], [address] [place], [place] [address]   Phosphorus     Status: None   Collection Time: 01/15/24  1:53 PM  Result Value Ref Range   Phosphorus 2.7 2.5 - 4.6 mg/dL    Comment: Performed at [facility], [address], [place], [place] [address]  Urinalysis, Routine w reflex microscopic -Urine, Clean Catch     Status: Abnormal   Collection Time: 01/15/24  3:30 PM  Result Value Ref Range   Color, Urine YELLOW YELLOW   APPearance HAZY (A) CLEAR   Specific Gravity, Urine 1.006 1.005 - 1.030   pH 7.0 5.0 - 8.0   Glucose, UA NEGATIVE NEGATIVE mg/dL   Hgb urine dipstick TRACE (A) NEGATIVE   Bilirubin Urine NEGATIVE NEGATIVE   Ketones, ur NEGATIVE NEGATIVE mg/dL   Protein, ur TRACE (A) NEGATIVE mg/dL   Nitrite POSITIVE (A) NEGATIVE   Leukocytes,Ua LARGE (A) NEGATIVE   RBC / HPF 0-5 0 - 5 RBC/hpf   WBC, UA >50 0 - 5 WBC/hpf   Bacteria, UA FEW (A) NONE SEEN   Squamous Epithelial / HPF 0-5 0 - 5 /HPF   WBC Clumps PRESENT    Non Squamous Epithelial 0-5 (A) NONE SEEN    Comment: Performed at [facility], [address] [place], [place] [address]  Sodium, urine, random     Status: None   Collection Time: 01/15/24  3:30 PM  Result Value Ref Range   Sodium, Ur 17 mmol/L    Comment: Performed at [facility], [address], [place], [place] [address]  Osmolality     Status: Abnormal   Collection  Time: 01/15/24  3:30 PM  Result Value Ref Range   Osmolality 259 (L) 275 - 295 mOsm/kg    Comment: Performed at [facility], [address], [place], [place] [address]  Osmolality, urine     Status: Abnormal   Collection Time: 01/15/24  3:30 PM  Result Value Ref Range   Osmolality, Ur 160 (L) 300 - 900 mOsm/kg    Comment: Performed at [facility], [address], [place], [place] [address]  Respiratory (~20 pathogens) panel by PCR     Status: Abnormal   Collection Time: 01/15/24  8:49 PM   Specimen: Nasopharyngeal Swab; Respiratory  Result Value Ref Range   Adenovirus NOT DETECTED NOT DETECTED   Coronavirus 229E DETECTED (A) NOT DETECTED    Comment: (NOTE) The Coronavirus on the Respiratory Panel, DOES NOT test for the novel  Coronavirus (2019 nCoV)    Coronavirus HKU1 NOT DETECTED NOT DETECTED   Coronavirus NL63 NOT DETECTED NOT DETECTED   Coronavirus OC43 NOT DETECTED NOT DETECTED   Metapneumovirus NOT DETECTED NOT DETECTED   Rhinovirus / Enterovirus NOT DETECTED NOT DETECTED  Influenza A NOT DETECTED NOT DETECTED   Influenza B NOT DETECTED NOT DETECTED   Parainfluenza Virus 1 NOT DETECTED NOT DETECTED   Parainfluenza Virus 2 NOT DETECTED NOT DETECTED   Parainfluenza Virus 3 NOT DETECTED NOT DETECTED   Parainfluenza Virus 4 NOT DETECTED NOT DETECTED   Respiratory Syncytial Virus NOT DETECTED NOT DETECTED   Bordetella pertussis NOT DETECTED NOT DETECTED   Bordetella Parapertussis NOT DETECTED NOT DETECTED   Chlamydophila pneumoniae NOT DETECTED NOT DETECTED   Mycoplasma pneumoniae NOT DETECTED NOT DETECTED    Comment: Performed at [facility], [address], [place], [place] [address]  Culture, blood (routine x 2) Call MD if unable to obtain prior to antibiotics being given     Status: None (Preliminary result)   Collection Time: 01/15/24  8:56 PM   Specimen: BLOOD LEFT ARM  Result Value Ref Range   Specimen Description BLOOD LEFT ARM    Special  Requests      BOTTLES DRAWN AEROBIC AND ANAEROBIC Blood Culture results may not be optimal due to an inadequate volume of blood received in culture bottles   Culture      NO GROWTH 2 DAYS Performed at [facility], [address], [place], [place] [address]    Report Status PENDING   Culture, blood (routine x 2) Call MD if unable to obtain prior to antibiotics being given     Status: None (Preliminary result)   Collection Time: 01/15/24  8:58 PM   Specimen: BLOOD RIGHT HAND  Result Value Ref Range   Specimen Description BLOOD RIGHT HAND    Special Requests      BOTTLES DRAWN AEROBIC AND ANAEROBIC Blood Culture adequate volume   Culture      NO GROWTH 2 DAYS Performed at [facility], [address], [place], [place] [address]    Report Status PENDING   Magnesium      Status: Abnormal   Collection Time: 01/16/24 12:21 AM  Result Value Ref Range   Magnesium  2.9 (H) 1.7 - 2.4 mg/dL    Comment: Performed at [facility], [address] [place], [place] [address]  Procalcitonin     Status: None   Collection Time: 01/16/24 12:21 AM  Result Value Ref Range   Procalcitonin 0.22 ng/mL    Comment:        Interpretation: PCT (Procalcitonin) <= 0.5 ng/mL: Systemic infection (sepsis) is not likely. Local bacterial infection is possible. (NOTE)       Sepsis PCT Algorithm           Lower Respiratory Tract                                      Infection PCT Algorithm    ----------------------------     ----------------------------         PCT < 0.25 ng/mL                PCT < 0.10 ng/mL          Strongly encourage             Strongly discourage   discontinuation of antibiotics    initiation of antibiotics    ----------------------------     -----------------------------       PCT 0.25 - 0.50 ng/mL            PCT 0.10 - 0.25  ng/mL               OR       >80% decrease in PCT            Discourage initiation of                                            antibiotics       Encourage discontinuation           of antibiotics    ----------------------------     -----------------------------         PCT >= 0.50 ng/mL              PCT 0.26 - 0.50 ng/mL               AND        <80% decrease in PCT             Encourage initiation of                                             antibiotics       Encourage continuation           of antibiotics    ----------------------------     -----------------------------        PCT >= 0.50 ng/mL                  PCT > 0.50 ng/mL               AND         increase in PCT                  Strongly encourage                                      initiation of antibiotics    Strongly encourage escalation           of antibiotics                                     -----------------------------                                           PCT <= 0.25 ng/mL                                                 OR                                        > 80% decrease in PCT  Discontinue / Do not initiate                                             antibiotics  Performed at [facility], [address] [place], [place] [address]   Sodium     Status: Abnormal   Collection Time: 01/16/24 12:21 AM  Result Value Ref Range   Sodium 126 (L) 135 - 145 mmol/L    Comment: Performed at [facility], [address], [place], [place] [address]  Glucose, capillary     Status: Abnormal   Collection Time: 01/16/24  1:05 AM  Result Value Ref Range   Glucose-Capillary 143 (H) 70 - 99 mg/dL    Comment: Glucose reference range applies only to samples taken after fasting for at least 8 hours.  CBC     Status: Abnormal   Collection Time: 01/16/24  5:13 AM  Result Value Ref Range   WBC 12.8 (H) 4.0 - 10.5 K/uL   RBC 3.58 (L) 3.87 - 5.11 MIL/uL   Hemoglobin 10.5 (L) 12.0 - 15.0 g/dL   HCT 09.8 (L) 11.9 - 14.7 %   MCV 87.2 80.0 - 100.0 fL   MCH 29.3 26.0 - 34.0 pg   MCHC 33.7 30.0 - 36.0 g/dL   RDW  82.9 56.2 - 13.0 %   Platelets 268 150 - 400 K/uL   nRBC 0.0 0.0 - 0.2 %    Comment: Performed at [facility], [address], [place], [place] [address]  Basic metabolic panel     Status: Abnormal   Collection Time: 01/16/24  5:13 AM  Result Value Ref Range   Sodium 126 (L) 135 - 145 mmol/L   Potassium 3.5 3.5 - 5.1 mmol/L   Chloride 92 (L) 98 - 111 mmol/L   CO2 23 22 - 32 mmol/L   Glucose, Bld 167 (H) 70 - 99 mg/dL    Comment: Glucose reference range applies only to samples taken after fasting for at least 8 hours.   BUN 11 8 - 23 mg/dL   Creatinine, Ser 4.69 (H) 0.44 - 1.00 mg/dL   Calcium  7.8 (L) 8.9 - 10.3 mg/dL   GFR, Estimated 46 (L) >60 mL/min    Comment: (NOTE) Calculated using the CKD-EPI Creatinine Equation (2021)    Anion gap 11 5 - 15    Comment: Performed at [facility], [address] [place], [place] [address]  Strep pneumoniae urinary antigen     Status: None   Collection Time: 01/16/24  6:17 AM  Result Value Ref Range   Strep Pneumo Urinary Antigen NEGATIVE NEGATIVE    Comment:        Infection due to S. pneumoniae cannot be absolutely ruled out since the antigen present may be below the detection limit of the test. Performed at [facility], [address], [place], [place] [address]   Glucose, capillary     Status: Abnormal   Collection Time: 01/16/24 11:31 AM  Result Value Ref Range   Glucose-Capillary 232 (H) 70 - 99 mg/dL    Comment: Glucose reference range applies only to samples taken after fasting for at least 8 hours.  Resp panel by RT-PCR (RSV, Flu A&B, Covid) Anterior Nasal Swab     Status: None   Collection Time: 01/16/24 12:48 PM   Specimen:  Anterior Nasal Swab  Result Value Ref Range   SARS Coronavirus 2 by RT PCR NEGATIVE NEGATIVE   Influenza A by PCR NEGATIVE NEGATIVE   Influenza B by PCR NEGATIVE NEGATIVE    Comment: (NOTE) The Xpert Xpress SARS-CoV-2/FLU/RSV plus assay is intended as an aid in the diagnosis of  influenza from Nasopharyngeal swab specimens and should not be used as a sole basis for treatment. Nasal washings and aspirates are unacceptable for Xpert Xpress SARS-CoV-2/FLU/RSV testing.  Fact Sheet for Patients: BloggerCourse.com  Fact Sheet for Healthcare Providers: SeriousBroker.it  This test is not yet approved or cleared by the [place]  FDA and has been authorized for detection and/or diagnosis of SARS-CoV-2 by FDA under an Emergency Use Authorization (EUA). This EUA will remain in effect (meaning this test can be used) for the duration of the COVID-19 declaration under Section 564(b)(1) of the Act, 21 U.S.C. section 360bbb-3(b)(1), unless the authorization is terminated or revoked.     Resp Syncytial Virus by PCR NEGATIVE NEGATIVE    Comment: (NOTE) Fact Sheet for Patients: BloggerCourse.com  Fact Sheet for Healthcare Providers: SeriousBroker.it  This test is not yet approved or cleared by the [place]  FDA and has been authorized for detection and/or diagnosis of SARS-CoV-2 by FDA under an Emergency Use Authorization (EUA). This EUA will remain in effect (meaning this test can be used) for the duration of the COVID-19 declaration under Section 564(b)(1) of the Act, 21 U.S.C. section 360bbb-3(b)(1), unless the authorization is terminated or revoked.  Performed at [facility], [address] [place], [place] [address]   Sodium     Status: Abnormal   Collection Time: 01/16/24  1:23 PM  Result Value Ref Range   Sodium 125 (L) 135 - 145 mmol/L    Comment: Performed at [facility], [address], [place], [place] [address]  Glucose, capillary     Status: Abnormal   Collection Time: 01/16/24  6:07 PM  Result Value Ref Range   Glucose-Capillary 194 (H) 70 - 99 mg/dL    Comment: Glucose reference range applies only to samples taken after fasting  for at least 8 hours.  Sodium     Status: Abnormal   Collection Time: 01/16/24  9:16 PM  Result Value Ref Range   Sodium 126 (L) 135 - 145 mmol/L    Comment: Performed at [facility], [address] [place], [place] [address]  Sodium     Status: Abnormal   Collection Time: 01/17/24 12:35 AM  Result Value Ref Range   Sodium 125 (L) 135 - 145 mmol/L    Comment: Performed at [facility], [address], [place], [place] [address]  Basic metabolic panel     Status: Abnormal   Collection Time: 01/17/24  4:50 AM  Result Value Ref Range   Sodium 125 (L) 135 - 145 mmol/L   Potassium 3.4 (L) 3.5 - 5.1 mmol/L   Chloride 92 (L) 98 - 111 mmol/L   CO2 24 22 - 32 mmol/L   Glucose, Bld 141 (H) 70 - 99 mg/dL    Comment: Glucose reference range applies only to samples taken after fasting for at least 8 hours.   BUN 12 8 - 23 mg/dL   Creatinine, Ser 6.57 0.44 - 1.00 mg/dL   Calcium  7.6 (L) 8.9 - 10.3 mg/dL   GFR, Estimated 59 (L) >60 mL/min    Comment: (NOTE) Calculated using the CKD-EPI Creatinine Equation (2021)  Anion gap 9 5 - 15    Comment: Performed at [facility], [address], [place], [place] [address]  CBC     Status: Abnormal   Collection Time: 01/17/24  4:50 AM  Result Value Ref Range   WBC 13.5 (H) 4.0 - 10.5 K/uL   RBC 3.35 (L) 3.87 - 5.11 MIL/uL   Hemoglobin 9.9 (L) 12.0 - 15.0 g/dL   HCT 91.4 (L) 78.2 - 95.6 %   MCV 86.9 80.0 - 100.0 fL   MCH 29.6 26.0 - 34.0 pg   MCHC 34.0 30.0 - 36.0 g/dL   RDW 21.3 08.6 - 57.8 %   Platelets 316 150 - 400 K/uL   nRBC 0.0 0.0 - 0.2 %    Comment: Performed at [facility], [address] [place], [place] [address]  Glucose, capillary     Status: Abnormal   Collection Time: 01/17/24  6:06 AM  Result Value Ref Range   Glucose-Capillary 138 (H) 70 - 99 mg/dL    Comment: Glucose reference range applies only to samples taken after fasting for at least 8 hours.   DG Chest 2 View Result Date:  01/15/2024 CLINICAL DATA:  Cough and congestion for 3 weeks. EXAM: CHEST - 2 VIEW COMPARISON:  Two-view chest x-ray 04/06/2023. FINDINGS: The heart is enlarged. Atherosclerotic calcifications are present at the aortic arch. Linear atelectasis or scarring in the left lower lobe is stable. New inferior right upper lobe airspace opacity is present. No significant edema or effusion is present. Exaggerated thoracic kyphosis is again noted. Lumbar fusion is noted. IMPRESSION: 1. New inferior right upper lobe airspace opacity concerning for pneumonia. 2. Stable cardiomegaly without failure. 3. Stable linear atelectasis or scarring in the left lower lobe. Electronically Signed   By: Audree Leas M.D.   On: 01/15/2024 12:42    PMH:   Past Medical History:  Diagnosis Date   Allergy    Arthritis    Asthma    esosinophillic   Cancer (HCC)    vocal cord   Cataract    GERD (gastroesophageal reflux disease)    Hyperlipidemia    Hypertension    Hyponatremia    Osteoporosis    Spinal stenosis     PSH:   Past Surgical History:  Procedure Laterality Date   FEMUR FRACTURE SURGERY Right 08/2016   HERNIA REPAIR Right    inguinal   JOINT REPLACEMENT  2017   bilateral knee   LAMINECTOMY  11/2020   L3-5   LARYNGOSCOPY  11/2021   SPINAL FUSION  03/2021    Allergies: No Known Allergies  Medications:   Prior to Admission medications   Medication Sig Start Date End Date Taking? Authorizing Provider  albuterol  (PROVENTIL  HFA) 108 (90 Base) MCG/ACT inhaler Inhale 1-2 puffs into the lungs every 6 (six) hours as needed for wheezing or shortness of breath.   Yes [provider]  albuterol  (PROVENTIL ) (2.5 MG/3ML) 0.083% nebulizer solution Take 3 mLs (2.5 mg total) by nebulization every 6 (six) hours as needed for wheezing or shortness of breath. 07/30/22  Yes Aleck Hurdle, MD  ALPRAZolam  (XANAX ) 0.5 MG tablet Take 1 tablet (0.5 mg total) by mouth at bedtime as needed for anxiety. 07/27/23   Yes Christel Cousins, MD  amLODipine  (NORVASC ) 2.5 MG tablet Take 1 tablet (2.5 mg total) by mouth daily. 03/30/23  Yes Christel Cousins, MD  atorvastatin  (LIPITOR) 10 MG tablet Take 1 tablet (10 mg total) by  mouth daily. 02/03/23  Yes Sonny Dust, MD  carvedilol  (COREG ) 25 MG tablet Take 1 tablet (25 mg total) by mouth 2 (two) times daily with a meal. 04/27/23  Yes Christel Cousins, MD  Coenzyme Q10 (COQ-10 PO) Take 1 capsule by mouth daily.   Yes [provider]  Cyanocobalamin  (VITAMIN B 12 PO) Take 1 tablet by mouth daily.   Yes [provider]  FASENRA  PEN 30 MG/ML prefilled autoinjector INJECT 1 PEN UNDER THE SKIN EVERY 8 WEEKS 10/25/23  Yes Aleck Hurdle, MD  fluticasone -salmeterol (ADVAIR) 250-50 MCG/ACT AEPB Inhale 1 puff into the lungs in the morning and at bedtime. 01/12/24  Yes Aleck Hurdle, MD  furosemide  (LASIX ) 40 MG tablet Take 40 mg by mouth daily.   Yes [provider]  Magnesium  Oxide (MAG-OXIDE PO) Take 1 tablet by mouth daily.   Yes [provider]  mirabegron  ER (MYRBETRIQ ) 50 MG TB24 tablet Take 1 tablet (50 mg total) by mouth daily. 04/27/23  Yes Christel Cousins, MD  mirtazapine  (REMERON ) 7.5 MG tablet Take 1 tablet (7.5 mg total) by mouth at bedtime. 03/30/23  Yes Christel Cousins, MD  predniSONE  [facility] UNI-PAK 21 TAB) 10 MG (21) TBPK tablet Take as directed 01/01/24  Yes Acevedo, Angela, PA  PREVACID  30 MG capsule Take 1 capsule (30 mg total) by mouth daily at 12 noon. 07/27/23  Yes Christel Cousins, MD  tiotropium (SPIRIVA) 18 MCG inhalation capsule Place 18 mcg into inhaler and inhale daily.   Yes [provider]  Vitamin D , Ergocalciferol , 50 MCG (2000 UT) CAPS Take by mouth daily at 6 (six) AM.   Yes [provider]  benzonatate  (TESSALON ) 100 MG capsule Take 1 capsule (100 mg total) by mouth every 8 (eight) hours. Patient not taking: Reported on 01/15/2024 01/01/24   Acevedo, Angela, PA    Discontinued Meds:    Medications Discontinued During This Encounter  Medication Reason   albuterol  (VENTOLIN  HFA) 108 (90 Base) MCG/ACT inhaler 2 puff P&T Policy: Therapeutic Substitute   albuterol  (PROVENTIL ) (2.5 MG/3ML) 0.083% nebulizer solution 2.5 mg    0.9 %  sodium chloride  infusion    azelastine  (ASTELIN ) 0.1 % nasal spray Patient Preference   Ferrous Sulfate (IRON PO) Patient Preference   ondansetron  (ZOFRAN -ODT) 4 MG disintegrating tablet Patient Preference   Tiotropium Bromide Monohydrate (SPIRIVA RESPIMAT) 1.25 MCG/ACT AERS Duplicate   fluticasone  furoate-vilanterol (BREO ELLIPTA) 200-25 MCG/ACT 1 puff    benzonatate  (TESSALON ) capsule 100 mg    remdesivir 200 mg in sodium chloride  0.9% 250 mL IVPB Discontinued by provider   remdesivir 100 mg in sodium chloride  0.9 % 100 mL IVPB Discontinued by provider   atorvastatin  (LIPITOR) tablet 10 mg    nirmatrelvir/ritonavir (renal dosing) (PAXLOVID) 2 tablet    doxycycline (VIBRAMYCIN) 100 mg in sodium chloride  0.9 % 250 mL IVPB    sodium chloride  tablet 1 g    pantoprazole  (PROTONIX ) EC tablet 40 mg    cefTRIAXone  (ROCEPHIN ) 2 g in sodium chloride  0.9 % 100 mL IVPB    doxycycline (VIBRA-TABS) tablet 100 mg     Social History:  reports that she quit smoking about 35 years ago. Her smoking use included cigarettes. She has never been exposed to tobacco smoke. She has never used smokeless tobacco. She reports current alcohol use of about 5.0 standard drinks of alcohol per week. She reports that she does not use drugs.  Family History:   Family History  Problem Relation  Age of Onset   Diabetes Sister    Cancer Sister 75       ovarian   Anemia Sister    Allergies Daughter    Asthma Neg Hx     Blood pressure (!) 113/47, pulse 83, temperature 98.2 F (36.8 C), temperature source Oral, resp. rate 18, height 4\' 8"  (1.422 m), weight 58.5 kg, SpO2 96%. General appearance: alert, cooperative, and appears stated age Head: Normocephalic, without obvious  abnormality, atraumatic Eyes: negative Neck: no adenopathy, no carotid bruit, no JVD, supple, symmetrical, trachea midline, and thyroid  not enlarged, symmetric, no tenderness/mass/nodules Back: symmetric, no curvature. ROM normal. No CVA tenderness. Resp: rhonchi bibasilar Cardio: regular rate and rhythm GI: soft, non-tender; bowel sounds normal; no masses,  no organomegaly Extremities: edema none Skin: dry turgor       Aleathea Pugmire, Alveda Aures, MD 01/17/2024, 7:42 AM

## 2024-01-17 NOTE — Progress Notes (Signed)
 PHARMACY NOTE:  ANTIMICROBIAL RENAL DOSAGE ADJUSTMENT  Current antimicrobial regimen includes a mismatch between antimicrobial dosage and estimated renal function.  As per policy approved by the Pharmacy & Therapeutics and Medical Executive Committees, the antimicrobial dosage will be adjusted accordingly.  Current antimicrobial dosage:  Levaquin 500 mg IV every 24 hours  Indication: CAP/Asthma exacerbation  Renal Function:  Estimated Creatinine Clearance: 30.9 mL/min (by C-G formula based on SCr of 0.95 mg/dL). []      On intermittent HD, scheduled: []      On CRRT    Antimicrobial dosage has been changed to:  Levaquin 750mg  IV every 48 hours  Additional comments: Will continue to monitor renal function as borderline for even further reduction  Thank you for allowing pharmacy to be a part of this patient's care.  Lenard Quam, PharmD, BCPS, BCCCP Please refer to [facility] for [facility] numbers 01/17/2024 8:02 AM

## 2024-01-17 NOTE — Progress Notes (Signed)
 Mobility Specialist: Progress Note   01/17/24 1418  Mobility  Activity Ambulated with assistance in hallway  Level of Assistance Standby assist, set-up cues, supervision of patient - no hands on  Assistive Device Front wheel walker  Distance Ambulated (ft) 100 ft  Activity Response Tolerated well  Mobility Referral Yes  Mobility visit 1 Mobility  Mobility Specialist Start Time (ACUTE ONLY) 1317  Mobility Specialist Stop Time (ACUTE ONLY) 1330  Mobility Specialist Time Calculation (min) (ACUTE ONLY) 13 min    Pt was agreeable to mobility session - received in bed. Declined use of gait belt. SV throughout. Audibly SOB during ambulation. SpO2 WFL on RA. Returned to room without fault. Left in bed with all needs met, call bell in reach.   Deloria Fetch Mobility Specialist Please contact via SecureChat or Rehab office at [phone]

## 2024-01-17 NOTE — Evaluation (Signed)
 Physical Therapy Evaluation Patient Details Name: Julie Lutz MRN: [id number] DOB: [birth year] Today's Date: 01/17/2024  History of Present Illness  Patient is a 83 yo female presenting to the ED with chills, cough, congestion and SOB on 01/15/24.  Noted to be tachycardic, and admitted with CAP, + covid. 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 Pt presents from home with daughter, mobility PTA limited by mutliple orthopedic issued including chronic back pain. However, she is further limited now by generalized weakness and increased WOB with mobility. Pt ambulated in room with RW with CGA. Will follow acutely but do not anticipate her needing PT at d/c.  Pt currently with functional limitations due to the deficits listed below (see PT Problem List). Pt will benefit from acute skilled PT to increase their independence and safety with mobility to allow discharge.           If plan is discharge home, recommend the following: Help with stairs or ramp for entrance;Assist for transportation;Assistance with cooking/housework   Can travel by private vehicle        Equipment Recommendations None recommended by PT  Recommendations for Other Services       Functional Status Assessment Patient has had a recent decline in their functional status and demonstrates the ability to make significant improvements in function in a reasonable and predictable amount of time.     Precautions / Restrictions Precautions Precautions: Fall Recall of Precautions/Restrictions: Intact Restrictions Weight Bearing Restrictions Per Provider Order: No      Mobility  Bed Mobility Overal bed mobility: Needs Assistance Bed Mobility: Sit to Supine        Sit to supine: Contact guard assist   General bed mobility comments: increased time and changes in position cause coughing and SOB    Transfers Overall transfer level: Needs assistance Equipment used: Rolling walker (2 wheels) Transfers: Sit to/from Stand Sit to Stand: Contact guard assist           General transfer comment: CGA for safety from bed, chair, and toilet    Ambulation/Gait Ambulation/Gait assistance: Contact guard assist Gait Distance (Feet): 60 Feet (20', 40') Assistive device: Rolling walker (2 wheels) Gait Pattern/deviations: Step-through pattern, Trunk flexed Gait velocity: decreased Gait velocity interpretation: <1.8 ft/sec, indicate of risk for recurrent falls   General Gait Details: Shoes donned for ambulation. ambulation distance limited by chronic back pain and increased WOB. Used youth ht RW which helped her obtain more comfortable position. Seated rest break after 20'. SPO2 96% on RA, HR stable in 70's  Stairs            Wheelchair Mobility     Tilt Bed    Modified Rankin (Stroke Patients Only)       Balance Overall balance assessment: Needs assistance, History of Falls Sitting-balance support: No upper extremity supported, Feet supported Sitting balance-Leahy Scale: Good     Standing balance support: Bilateral upper extremity supported, During functional activity, Reliant on assistive device for balance Standing balance-Leahy Scale: Fair Standing balance comment: reliant on RW, but can static stand without                             Pertinent Vitals/Pain Pain Assessment Pain Assessment: Faces Faces Pain Scale: Hurts a little bit Pain Location:  chronic back pain Pain Descriptors / Indicators: Guarding Pain Intervention(s): Limited activity within patient's tolerance, Monitored during session    Home Living Family/patient expects to be discharged to:: Private residence Living Arrangements: Children Available Help  at Discharge: Available PRN/intermittently Type of Home: House Home Access: Stairs to enter Entrance Stairs-Rails: Left Entrance Stairs-Number of Steps: 2 Alternate Level Stairs-Number of Steps: 14 Home Layout: Two level;Able to live on main level with bedroom/bathroom Home Equipment: Rollator (4 wheels);Shower seat;BSC/3in1;Cane - single point;Grab bars - tub/shower;Grab bars - toilet      Prior Function Prior Level of Function : Independent/Modified Independent;Driving             Mobility Comments: uses a rollator, but will use a cane when she feels better ADLs Comments: drives intermittently, independent in ADLs and IADLs, manages her own medications     Extremity/Trunk Assessment   Upper Extremity Assessment Upper Extremity Assessment: Defer to OT evaluation    Lower Extremity Assessment Lower Extremity Assessment: Generalized weakness    Cervical / Trunk Assessment Cervical / Trunk Assessment: Normal  Communication   Communication Communication: No apparent difficulties    Cognition Arousal: Alert Behavior During Therapy: WFL for tasks assessed/performed   PT - Cognitive impairments: No apparent impairments                         Following commands: Intact       Cueing Cueing Techniques: Verbal cues     General Comments General comments (skin integrity, edema, etc.): gave pt IS and taught to use, goal set at 1000, pt currently reaching 750    Exercises     Assessment/Plan    PT Assessment Patient needs continued PT services  PT Problem List Decreased strength;Decreased activity tolerance;Decreased mobility;Pain;Cardiopulmonary status limiting activity       PT Treatment Interventions DME instruction;Gait training;Stair training;Functional mobility training;Therapeutic activities;Therapeutic exercise;Balance training;Patient/family education    PT Goals (Current goals can be found in the Care Plan section)  Acute Rehab PT  Goals Patient Stated Goal: return home, get more active PT Goal Formulation: With patient Time For Goal Achievement: 01/31/24 Potential to Achieve Goals: Good    Frequency Min 2X/week     Co-evaluation               AM-PAC PT "6 Clicks" Mobility  Outcome Measure Help needed turning from your back to your side while in a flat bed without using bedrails?: A Little Help needed moving from lying on your back to sitting on the side of a flat bed without using bedrails?: A Little Help needed moving to and from a bed to a chair (including a wheelchair)?: A Little Help needed standing up from a chair using your arms (e.g., wheelchair or bedside chair)?: A Little Help needed to walk in hospital room?: A Little Help needed climbing 3-5 steps with a railing? : A Lot 6 Click Score: 17    End of Session Equipment Utilized During Treatment: Gait belt Activity Tolerance: Patient tolerated treatment well Patient left: in bed;with call bell/phone within reach Nurse Communication: Mobility status PT Visit Diagnosis: Unsteadiness on feet (R26.81);Difficulty in walking, not elsewhere classified (R26.2);Muscle weakness (generalized) (M62.81);History of falling (Z91.81)    Time: 6213-0865 PT Time Calculation (min) (ACUTE ONLY): 34 min   Charges:   PT Evaluation $PT Eval Moderate Complexity: 1 Mod PT Treatments $Gait Training: 8-22 mins PT General Charges $$ ACUTE PT VISIT: 1 Visit  Amey Ka, PT  Acute Rehab Services Secure chat preferred Office [phone]   Sharman Debar 01/17/2024, 11:47 AM

## 2024-01-17 NOTE — TOC CM/SW Note (Addendum)
 Transition of Care [facility]) - Inpatient Brief Assessment   Patient Details  Name: Julie Lutz MRN: [id number] Date of Birth: May 23, 1941  Transition of Care [facility]) CM/SW Contact:    Tom-Johnson, Angelique Ken, RN Phone Number: 01/17/2024, 3:57 PM   Clinical Narrative:  Patient presented to the ED with worsening Chills, Cough, Congestion, and Shortness of Breath. Found to have Coronavirus 229E, PNA. Admitted with CAP and Asthma Exacerbation. Currently on Room Air, IV Solumedrol, Oral abx and Neb tx.   From home with daughter, Bartholomew Light. Patient has two supportive daughter, states she moved from [place] a year and a half ago where she lived her whole life. Daughter, Bartholomew Light assists with her needs and transports to and from appointments. Has all necessary DME's at home including handicapped bathroom.  PCP is Christel Cousins, MD and uses [facility] [address] [facility] in [place] and also [facility].   No PT/OT f/u noted. No TOC needs or recommendations noted at this time.  Patient not Medically ready for discharge.  CM will continue to follow as patient progresses with care towards discharge.               Transition of Care Asessment: Insurance and Status: Insurance coverage has been reviewed Patient has primary care physician: Yes Home environment has been reviewed: Yes Prior level of function:: Modified Independent Prior/Current Home Services: No current home services Social Drivers of Health Review: SDOH reviewed no interventions necessary Readmission risk has been reviewed: Yes Transition of care needs: no transition of care needs at this time

## 2024-01-18 DIAGNOSIS — J189 Pneumonia, unspecified organism: Secondary | ICD-10-CM | POA: Diagnosis not present

## 2024-01-18 DIAGNOSIS — E871 Hypo-osmolality and hyponatremia: Secondary | ICD-10-CM | POA: Diagnosis not present

## 2024-01-18 LAB — BASIC METABOLIC PANEL WITH GFR
Anion gap: 8 (ref 5–15)
BUN: 14 mg/dL (ref 8–23)
CO2: 23 mmol/L (ref 22–32)
Calcium: 7.8 mg/dL — ABNORMAL LOW (ref 8.9–10.3)
Chloride: 95 mmol/L — ABNORMAL LOW (ref 98–111)
Creatinine, Ser: 0.98 mg/dL (ref 0.44–1.00)
GFR, Estimated: 57 mL/min — ABNORMAL LOW (ref >60)
Glucose, Bld: 123 mg/dL — ABNORMAL HIGH (ref 70–99)
Potassium: 4 mmol/L (ref 3.5–5.1)
Sodium: 126 mmol/L — ABNORMAL LOW (ref 135–145)

## 2024-01-18 LAB — CBC
HCT: 28.4 % — ABNORMAL LOW (ref 36.0–46.0)
Hemoglobin: 9.6 g/dL — ABNORMAL LOW (ref 12.0–15.0)
MCH: 29.4 pg (ref 26.0–34.0)
MCHC: 33.8 g/dL (ref 30.0–36.0)
MCV: 87.1 fL (ref 80.0–100.0)
Platelets: 377 10*3/uL (ref 150–400)
RBC: 3.26 MIL/uL — ABNORMAL LOW (ref 3.87–5.11)
RDW: 13.2 % (ref 11.5–15.5)
WBC: 13.5 10*3/uL — ABNORMAL HIGH (ref 4.0–10.5)
nRBC: 0 % (ref 0.0–0.2)

## 2024-01-18 LAB — GLUCOSE, CAPILLARY
Glucose-Capillary: 107 mg/dL — ABNORMAL HIGH (ref 70–99)
Glucose-Capillary: 123 mg/dL — ABNORMAL HIGH (ref 70–99)
Glucose-Capillary: 161 mg/dL — ABNORMAL HIGH (ref 70–99)
Glucose-Capillary: 122 mg/dL — ABNORMAL HIGH (ref 70–99)

## 2024-01-18 LAB — LEGIONELLA PNEUMOPHILA SEROGP 1 UR AG: L. pneumophila Serogp 1 Ur Ag: NEGATIVE

## 2024-01-18 LAB — CORTISOL: Cortisol, Plasma: 3.3 ug/dL

## 2024-01-18 LAB — TSH: TSH: 0.375 u[IU]/mL (ref 0.350–4.500)

## 2024-01-18 MED ORDER — UREA 15 G PO PACK
15.0000 g | PACK | Freq: Two times a day (BID) | ORAL | Status: DC
  Administered 2024-01-18: 15 g via ORAL
  Filled 2024-01-18 (×2): qty 1

## 2024-01-18 MED ORDER — PREDNISONE 20 MG PO TABS
40.0000 mg | ORAL_TABLET | Freq: Every day | ORAL | Status: DC
  Administered 2024-01-19: 40 mg via ORAL
  Filled 2024-01-18: qty 2

## 2024-01-18 MED ORDER — FAMOTIDINE 20 MG PO TABS
20.0000 mg | ORAL_TABLET | Freq: Every day | ORAL | Status: DC | PRN
  Administered 2024-01-18 – 2024-01-19 (×2): 20 mg via ORAL
  Filled 2024-01-18 (×2): qty 1

## 2024-01-18 NOTE — Progress Notes (Signed)
 Mobility Specialist Progress Note:   01/18/24 0900  Mobility  Activity Ambulated with assistance in hallway  Level of Assistance Standby assist, set-up cues, supervision of patient - no hands on  Assistive Device Front wheel walker  Distance Ambulated (ft) 100 ft  Activity Response Tolerated well  Mobility Referral Yes  Mobility visit 1 Mobility  Mobility Specialist Start Time (ACUTE ONLY) 0827  Mobility Specialist Stop Time (ACUTE ONLY) 0836  Mobility Specialist Time Calculation (min) (ACUTE ONLY) 9 min   Received pt in bed having no complaints and agreeable to mobility. Pt was asymptomatic throughout ambulation and returned to room w/o fault. Left in bed w/ call bell in reach and all needs met.   D'Vante Nolon Baxter Mobility Specialist Please contact via Special educational needs teacher or Rehab office at [phone]

## 2024-01-18 NOTE — Progress Notes (Addendum)
 Julie Lutz is an 83 y.o. female hypertension, hyperlipidemia, chronic hypomg, iron deficiency anemia, osteoporosis, vocal cord cancer, chronic hyponatremia followed by Dr. Christianne Cowper p/w cough for 2 wk. Poor appetite for the days but cont to take Lasix  for the hyponatremia but is not on any sodium chloride  tablets. Cr 122 baseline 128-131 and a creatinine of 1.18. Urine osmolality was 160 and urine sodium was 17. X-ray showed a inferior right upper lobe opacity and patient treated for pneumonia.   Assessment/Plan: Hyponatremia -on examination she appears to be volume down with poor skin turgor and no edema.  Urine sodium 17 and urine osmolality 160 because patient had continued to take Lasix  at home.  Her sodium usually drops when she has an acute illness which is not surprising with pulmonary symptoms. 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 Fluid restrict to 40oz for now; she's really not drinking that much.   -Will also check a TSH and cortisol level. - Will trend and follow closely with you; started on sodium chloride  tablets TID long-term wise does not need to be on. - Na slowly rising; she's just very susceptible to hyponatremia whenever she gets a pulmonary infection. Did not tolerate Ure-na with GI upset, very poor appetite.   Pneumonia currently on Levaquin with a positive coronavirus 229E. Anemia - stable H/o vocal cord cancer HTN - on amlodipine   Subjective: SOB with exertion, cough. Denies fever, chills, nausea, headaches or dizziness.   Chemistry and CBC: Creat  Date/Time Value Ref Range Status  11/08/2023 03:47 PM 1.09 (H) 0.60 - 0.95 mg/dL Final   Creatinine, Ser  Date/Time Value Ref Range Status  01/18/2024 04:49 AM 0.98 0.44 - 1.00 mg/dL Final  16/06/9603 54:09 AM 0.95 0.44 - 1.00 mg/dL Final  81/19/1478 29:56 AM 1.17 (H) 0.44 - 1.00 mg/dL Final  21/30/8657 84:69 PM 1.18 (H) 0.44 - 1.00 mg/dL Final   62/95/2841 32:44 PM 1.11 0.40 - 1.20 mg/dL Final  09/22/7251 66:44 AM 1.12 0.40 - 1.20 mg/dL Final  03/47/4259 56:38 AM 1.12 0.40 - 1.20 mg/dL Final  75/64/3329 51:88 PM 0.95 0.40 - 1.20 mg/dL Final  41/66/0630 16:01 PM 0.92 0.44 - 1.00 mg/dL Final  09/32/3557 32:20 PM 1.05 0.40 - 1.20 mg/dL Final  25/42/7062 37:62 AM 0.92 0.40 - 1.20 mg/dL Final  83/15/1761 60:73 AM 0.85 0.44 - 1.00 mg/dL Final  71/02/2693 85:46 PM 0.88 0.40 - 1.20 mg/dL Final  27/11/5007 38:18 AM 1.01 0.40 - 1.20 mg/dL Final  29/93/7169 67:89 AM 1.00 0.40 - 1.20 mg/dL Final  38/06/1750 02:58 AM 1.03 (H) 0.57 - 1.00 mg/dL Final  52/77/8242 35:36 PM 0.95 0.40 - 1.20 mg/dL Final  14/43/1540 08:67 AM 0.90 0.44 - 1.00 mg/dL Final  61/95/0932 67:12 AM 0.93 0.44 - 1.00 mg/dL Final  45/80/9983 38:25 PM 1.29 (H) 0.44 - 1.00 mg/dL Final   Recent Labs  Lab 01/15/24 1353 01/16/24 0021 01/16/24 0513 01/16/24 1323 01/16/24 2116 01/17/24 0035 01/17/24 0450 01/18/24 0449  NA 122* 126* 126* 125* 126* 125* 125* 126*  K 4.0  --  3.5  --   --   --  3.4* 4.0  CL 83*  --  92*  --   --   --  92* 95*  CO2 25  --  23  --   --   --  24 23  GLUCOSE 108*  --  167*  --   --   --  141* 123*  BUN 12  --  11  --   --   --  12 14  CREATININE 1.18*  --  1.17*  --   --   --  0.95 0.98  CALCIUM  8.8*  --  7.8*  --   --   --  7.6* 7.8*  PHOS 2.7  --   --   --   --   --   --   --    Recent Labs  Lab 01/15/24 1353 01/16/24 0513 01/17/24 0450 01/18/24 0449  WBC 13.0* 12.8* 13.5* 13.5*  NEUTROABS 10.8*  --   --   --   HGB 11.0* 10.5* 9.9* 9.6*  HCT 32.7* 31.2* 29.1* 28.4*  MCV 86.3 87.2 86.9 87.1  PLT 294 268 316 377   Liver Function Tests: Recent Labs  Lab 01/15/24 1353  AST 17  ALT 14  ALKPHOS 96  BILITOT 0.6  PROT 6.7  ALBUMIN 3.8   No results for input(s): "LIPASE", "AMYLASE" in the last 168 hours. No results for input(s): "AMMONIA" in the last 168 hours. Cardiac Enzymes: No results for input(s): "CKTOTAL", "CKMB",  "CKMBINDEX", "TROPONINI" in the last 168 hours. Iron Studies: No results for input(s): "IRON", "TIBC", "TRANSFERRIN", "FERRITIN" in the last 72 hours. PT/INR: @LABRCNTIP (inr:5)  Xrays/Other Studies: ) Results for orders placed or performed during the hospital encounter of 01/15/24 (from the past 48 hours)  Glucose, capillary     Status: Abnormal   Collection Time: 01/16/24 11:31 AM  Result Value Ref Range   Glucose-Capillary 232 (H) 70 - 99 mg/dL    Comment: Glucose reference range applies only to samples taken after fasting for at least 8 hours.  Resp panel by RT-PCR (RSV, Flu A&B, Covid) Anterior Nasal Swab     Status: None   Collection Time: 01/16/24 12:48 PM   Specimen: Anterior Nasal Swab  Result Value Ref Range   SARS Coronavirus 2 by RT PCR NEGATIVE NEGATIVE   Influenza A by PCR NEGATIVE NEGATIVE   Influenza B by PCR NEGATIVE NEGATIVE    Comment: (NOTE) The Xpert Xpress SARS-CoV-2/FLU/RSV plus assay is intended as an aid in the diagnosis of influenza from Nasopharyngeal swab specimens and should not be used as a sole basis for treatment. Nasal washings and aspirates are unacceptable for Xpert Xpress SARS-CoV-2/FLU/RSV testing.  Fact Sheet for Patients: BloggerCourse.com  Fact Sheet for Healthcare Providers: SeriousBroker.it  This test is not yet approved or cleared by the [place]  FDA and has been authorized for detection and/or diagnosis of SARS-CoV-2 by FDA under an Emergency Use Authorization (EUA). This EUA will remain in effect (meaning this test can be used) for the duration of the COVID-19 declaration under Section 564(b)(1) of the Act, 21 U.S.C. section 360bbb-3(b)(1), unless the authorization is terminated or revoked.     Resp Syncytial Virus by PCR NEGATIVE NEGATIVE    Comment: (NOTE) Fact Sheet for Patients: BloggerCourse.com  Fact Sheet for Healthcare  Providers: SeriousBroker.it  This test is not yet approved or cleared by the [place]  FDA and has been authorized for detection and/or diagnosis of SARS-CoV-2 by FDA under an Emergency Use Authorization (EUA). This EUA will remain in effect (meaning this test can be used) for the duration of the COVID-19 declaration under Section 564(b)(1) of the Act, 21 U.S.C. section 360bbb-3(b)(1), unless the authorization is terminated or revoked.  Performed at [facility], [address], [place], [place] [address]   Sodium     Status:  Abnormal   Collection Time: 01/16/24  1:23 PM  Result Value Ref Range   Sodium 125 (L) 135 - 145 mmol/L    Comment: Performed at [facility], [address] [place], [place] [address]  Glucose, capillary     Status: Abnormal   Collection Time: 01/16/24  6:07 PM  Result Value Ref Range   Glucose-Capillary 194 (H) 70 - 99 mg/dL    Comment: Glucose reference range applies only to samples taken after fasting for at least 8 hours.  Sodium     Status: Abnormal   Collection Time: 01/16/24  9:16 PM  Result Value Ref Range   Sodium 126 (L) 135 - 145 mmol/L    Comment: Performed at [facility], [address], [place], [place] [address]  Sodium     Status: Abnormal   Collection Time: 01/17/24 12:35 AM  Result Value Ref Range   Sodium 125 (L) 135 - 145 mmol/L    Comment: Performed at [facility], [address], [place], [place] [address]  Basic metabolic panel     Status: Abnormal   Collection Time: 01/17/24  4:50 AM  Result Value Ref Range   Sodium 125 (L) 135 - 145 mmol/L   Potassium 3.4 (L) 3.5 - 5.1 mmol/L   Chloride 92 (L) 98 - 111 mmol/L   CO2 24 22 - 32 mmol/L   Glucose, Bld 141 (H) 70 - 99 mg/dL    Comment: Glucose reference range applies only to samples taken after fasting for at least 8 hours.   BUN 12 8 - 23 mg/dL   Creatinine, Ser 8.65 0.44 - 1.00 mg/dL   Calcium  7.6 (L) 8.9 - 10.3 mg/dL    GFR, Estimated 59 (L) >60 mL/min    Comment: (NOTE) Calculated using the CKD-EPI Creatinine Equation (2021)    Anion gap 9 5 - 15    Comment: Performed at [facility], [address] [place], [place] [address]  CBC     Status: Abnormal   Collection Time: 01/17/24  4:50 AM  Result Value Ref Range   WBC 13.5 (H) 4.0 - 10.5 K/uL   RBC 3.35 (L) 3.87 - 5.11 MIL/uL   Hemoglobin 9.9 (L) 12.0 - 15.0 g/dL   HCT 62.9 (L) 52.8 - 41.3 %   MCV 86.9 80.0 - 100.0 fL   MCH 29.6 26.0 - 34.0 pg   MCHC 34.0 30.0 - 36.0 g/dL   RDW 24.4 01.0 - 27.2 %   Platelets 316 150 - 400 K/uL   nRBC 0.0 0.0 - 0.2 %    Comment: Performed at [facility], [address] [place], [place] [address]  Glucose, capillary     Status: Abnormal   Collection Time: 01/17/24  6:06 AM  Result Value Ref Range   Glucose-Capillary 138 (H) 70 - 99 mg/dL    Comment: Glucose reference range applies only to samples taken after fasting for at least 8 hours.  Glucose, capillary     Status: Abnormal   Collection Time: 01/17/24  7:59 AM  Result Value Ref Range   Glucose-Capillary 152 (H) 70 - 99 mg/dL    Comment: Glucose reference range applies only to samples taken after fasting for at least 8 hours.  Hemoglobin A1c     Status: Abnormal   Collection Time: 01/17/24  8:39 AM  Result Value Ref Range   Hgb A1c MFr Bld 5.9 (H) 4.8 - 5.6 %  Comment: (NOTE) Pre diabetes:          5.7%-6.4%  Diabetes:              >6.4%  Glycemic control for   <7.0% adults with diabetes    Mean Plasma Glucose 122.63 mg/dL    Comment: Performed at [facility], [address], [place], [place] [address]  Glucose, capillary     Status: Abnormal   Collection Time: 01/17/24 12:45 PM  Result Value Ref Range   Glucose-Capillary 156 (H) 70 - 99 mg/dL    Comment: Glucose reference range applies only to samples taken after fasting for at least 8 hours.  Glucose, capillary     Status: Abnormal   Collection Time: 01/17/24  6:48 PM   Result Value Ref Range   Glucose-Capillary 137 (H) 70 - 99 mg/dL    Comment: Glucose reference range applies only to samples taken after fasting for at least 8 hours.  Glucose, capillary     Status: Abnormal   Collection Time: 01/17/24  8:17 PM  Result Value Ref Range   Glucose-Capillary 153 (H) 70 - 99 mg/dL    Comment: Glucose reference range applies only to samples taken after fasting for at least 8 hours.  Basic metabolic panel     Status: Abnormal   Collection Time: 01/18/24  4:49 AM  Result Value Ref Range   Sodium 126 (L) 135 - 145 mmol/L   Potassium 4.0 3.5 - 5.1 mmol/L   Chloride 95 (L) 98 - 111 mmol/L   CO2 23 22 - 32 mmol/L   Glucose, Bld 123 (H) 70 - 99 mg/dL    Comment: Glucose reference range applies only to samples taken after fasting for at least 8 hours.   BUN 14 8 - 23 mg/dL   Creatinine, Ser 6.04 0.44 - 1.00 mg/dL   Calcium  7.8 (L) 8.9 - 10.3 mg/dL   GFR, Estimated 57 (L) >60 mL/min    Comment: (NOTE) Calculated using the CKD-EPI Creatinine Equation (2021)    Anion gap 8 5 - 15    Comment: Performed at [facility], [address] [place], [place] [address]  CBC     Status: Abnormal   Collection Time: 01/18/24  4:49 AM  Result Value Ref Range   WBC 13.5 (H) 4.0 - 10.5 K/uL   RBC 3.26 (L) 3.87 - 5.11 MIL/uL   Hemoglobin 9.6 (L) 12.0 - 15.0 g/dL   HCT 11.9 (L) 14.7 - 82.9 %   MCV 87.1 80.0 - 100.0 fL   MCH 29.4 26.0 - 34.0 pg   MCHC 33.8 30.0 - 36.0 g/dL   RDW 56.2 13.0 - 86.5 %   Platelets 377 150 - 400 K/uL   nRBC 0.0 0.0 - 0.2 %    Comment: Performed at [facility], [address] [place], [place] [address]  Glucose, capillary     Status: Abnormal   Collection Time: 01/18/24  7:48 AM  Result Value Ref Range   Glucose-Capillary 107 (H) 70 - 99 mg/dL    Comment: Glucose reference range applies only to samples taken after fasting for at least 8 hours.   No results found.  PMH:   Past Medical History:  Diagnosis Date   Allergy     Arthritis    Asthma    esosinophillic   Cancer (HCC)    vocal cord   Cataract    GERD (gastroesophageal reflux disease)    Hyperlipidemia  Hypertension    Hyponatremia    Osteoporosis    Spinal stenosis     PSH:   Past Surgical History:  Procedure Laterality Date   FEMUR FRACTURE SURGERY Right 08/2016   HERNIA REPAIR Right    inguinal   JOINT REPLACEMENT  2017   bilateral knee   LAMINECTOMY  11/2020   L3-5   LARYNGOSCOPY  11/2021   SPINAL FUSION  03/2021    Allergies: No Known Allergies  Medications:   Prior to Admission medications   Medication Sig Start Date End Date Taking? Authorizing Provider  albuterol  (PROVENTIL  HFA) 108 (90 Base) MCG/ACT inhaler Inhale 1-2 puffs into the lungs every 6 (six) hours as needed for wheezing or shortness of breath.   Yes [provider]  albuterol  (PROVENTIL ) (2.5 MG/3ML) 0.083% nebulizer solution Take 3 mLs (2.5 mg total) by nebulization every 6 (six) hours as needed for wheezing or shortness of breath. 07/30/22  Yes Aleck Hurdle, MD  ALPRAZolam  (XANAX ) 0.5 MG tablet Take 1 tablet (0.5 mg total) by mouth at bedtime as needed for anxiety. 07/27/23  Yes Christel Cousins, MD  amLODipine  (NORVASC ) 2.5 MG tablet Take 1 tablet (2.5 mg total) by mouth daily. 03/30/23  Yes Christel Cousins, MD  atorvastatin  (LIPITOR) 10 MG tablet Take 1 tablet (10 mg total) by mouth daily. 02/03/23  Yes Sonny Dust, MD  carvedilol  (COREG ) 25 MG tablet Take 1 tablet (25 mg total) by mouth 2 (two) times daily with a meal. 04/27/23  Yes Christel Cousins, MD  Coenzyme Q10 (COQ-10 PO) Take 1 capsule by mouth daily.   Yes [provider]  Cyanocobalamin  (VITAMIN B 12 PO) Take 1 tablet by mouth daily.   Yes [provider]  FASENRA  PEN 30 MG/ML prefilled autoinjector INJECT 1 PEN UNDER THE SKIN EVERY 8 WEEKS 10/25/23  Yes Aleck Hurdle, MD  fluticasone -salmeterol (ADVAIR) 250-50 MCG/ACT AEPB Inhale 1 puff into the lungs in the morning  and at bedtime. 01/12/24  Yes Aleck Hurdle, MD  furosemide  (LASIX ) 40 MG tablet Take 40 mg by mouth daily.   Yes [provider]  Magnesium  Oxide (MAG-OXIDE PO) Take 1 tablet by mouth daily.   Yes [provider]  mirabegron  ER (MYRBETRIQ ) 50 MG TB24 tablet Take 1 tablet (50 mg total) by mouth daily. 04/27/23  Yes Christel Cousins, MD  mirtazapine  (REMERON ) 7.5 MG tablet Take 1 tablet (7.5 mg total) by mouth at bedtime. 03/30/23  Yes Christel Cousins, MD  predniSONE  (STERAPRED UNI-PAK 21 TAB) 10 MG (21) TBPK tablet Take as directed 01/01/24  Yes Acevedo, Angela, PA  PREVACID  30 MG capsule Take 1 capsule (30 mg total) by mouth daily at 12 noon. 07/27/23  Yes Christel Cousins, MD  tiotropium (SPIRIVA) 18 MCG inhalation capsule Place 18 mcg into inhaler and inhale daily.   Yes [provider]  Vitamin D , Ergocalciferol , 50 MCG (2000 UT) CAPS Take by mouth daily at 6 (six) AM.   Yes [provider]  benzonatate  (TESSALON ) 100 MG capsule Take 1 capsule (100 mg total) by mouth every 8 (eight) hours. Patient not taking: Reported on 01/15/2024 01/01/24   Acevedo, Angela, PA    Discontinued Meds:   Medications Discontinued During This Encounter  Medication Reason   albuterol  (VENTOLIN  HFA) 108 (90 Base) MCG/ACT inhaler 2 puff P&T Policy: Therapeutic Substitute   albuterol  (PROVENTIL ) (2.5 MG/3ML) 0.083% nebulizer solution 2.5 mg    0.9 %  sodium chloride   infusion    azelastine  (ASTELIN ) 0.1 % nasal spray Patient Preference   Ferrous Sulfate (IRON PO) Patient Preference   ondansetron  (ZOFRAN -ODT) 4 MG disintegrating tablet Patient Preference   Tiotropium Bromide Monohydrate (SPIRIVA RESPIMAT) 1.25 MCG/ACT AERS Duplicate   fluticasone  furoate-vilanterol (BREO ELLIPTA) 200-25 MCG/ACT 1 puff    benzonatate  (TESSALON ) capsule 100 mg    remdesivir 200 mg in sodium chloride  0.9% 250 mL IVPB Discontinued by provider   remdesivir 100 mg in sodium chloride  0.9 % 100 mL IVPB  Discontinued by provider   atorvastatin  (LIPITOR) tablet 10 mg    nirmatrelvir/ritonavir (renal dosing) (PAXLOVID) 2 tablet    doxycycline (VIBRAMYCIN) 100 mg in sodium chloride  0.9 % 250 mL IVPB    sodium chloride  tablet 1 g    pantoprazole  (PROTONIX ) EC tablet 40 mg    cefTRIAXone  (ROCEPHIN ) 2 g in sodium chloride  0.9 % 100 mL IVPB    doxycycline (VIBRA-TABS) tablet 100 mg    levofloxacin (LEVAQUIN) tablet 750 mg    guaiFENesin-dextromethorphan (ROBITUSSIN DM) 100-10 MG/5ML syrup 15 mL    methylPREDNISolone sodium succinate (SOLU-MEDROL) 40 mg/mL injection 40 mg    guaiFENesin-dextromethorphan (ROBITUSSIN DM) 100-10 MG/5ML syrup 15 mL     Social History:  reports that she quit smoking about 35 years ago. Her smoking use included cigarettes. She has never been exposed to tobacco smoke. She has never used smokeless tobacco. She reports current alcohol use of about 5.0 standard drinks of alcohol per week. She reports that she does not use drugs.  Family History:   Family History  Problem Relation Age of Onset   Diabetes Sister    Cancer Sister 1       ovarian   Anemia Sister    Allergies Daughter    Asthma Neg Hx     Blood pressure (!) 142/81, pulse 88, temperature 98.4 F (36.9 C), resp. rate 18, height 4\' 8"  (1.422 m), weight 58.5 kg, SpO2 100%. Physical Exam: General appearance: alert, cooperative, NAD Head: NCAT Resp: rhonchi bibasilar Cardio: RRR GI: SNDNT +BS Extremities: edema none Skin: dry turgor     Elnita Surprenant, Alveda Aures, MD 01/18/2024, 8:33 AM

## 2024-01-18 NOTE — Care Management Important Message (Signed)
 Important Message  Patient Details  Name: Julie Lutz MRN: [id number] Date of Birth: 08/21/1941   Important Message Given:  Yes - Medicare IM     Wynonia Hedges 01/18/2024, 3:28 PM

## 2024-01-18 NOTE — Progress Notes (Signed)
 Progress Note   Patient: Julie Lutz ZOX:[id number] DOB: 1941/02/12 DOA: 01/15/2024     3 DOS: the patient was seen and examined on 01/18/2024   Brief hospital course: Julie Lutz is a 83 y.o. 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  Initially has been seen in urgent care treated with a steroid improved however for last 2 days symptom has been worsened developed fever, chills, congestion and shortness of breath.   Daughter at the bedside reported that lately patient has been drinking a lot of water as she continues to cough and complaining of shortness of breath and feels thirsty. Patient is complaining about productive cough, shortness of breath and wheezing.  Denies any fever fever, chill, chest pain, and palpitation.  Denies any difficulty swallowing and sensation of throat closure. No other complaint at this time.   ED Course: Patient was tachycardic, otherwise hemodynamically stable. CMP showing low sodium 122.  (Baseline sodium is around 128-131), elevated creatinine 1.18.- Low magnesium  0.8.  Normal phosphorus level. Low serum osmolarity 259, low urine osmolality 160, and urine sodium 17. CBC showing leukocytosis 13, stable H&H and normal platelet count.   Chest x-ray showed inferior right upper lobe opacity concerning for pneumonia.  Stable cardiomegaly.  Stable linear atelectasis.   Was treated with albuterol  nebulizer, DuoNeb nebulizer and mag sulfate 2 g.   Hospitalist has been consulted and patient has been [facility] for the management of  hyponatremia, hypomagnesemia and pneumonia.   Assessment and Plan: Community-acquired pneumonia- Acute asthma exacerbation - Initially noted have shortness of breath with exertion, -cough improved, remains hemodynamically stable. - Influenza A/B, SARS-CoV-2 negative - Chest x-ray showing evidence of pneumonia.   - Positive Coronavirus 229E  -  Legionella antigen pending -urine strep antigen neg -blood cx neg - initially on continue IV ceftriaxone  and doxycycline, now on p.o. Levaquin to complete course - received IV Solu-Medrol. Cont pred taper -At home patient is not any long-acting bronchodilator.  -continue Breo Ellipta twice daily and continue albuterol  nebulizer as needed.  Continue supportive care. - Pro-Cal 0.22     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- History of SIADH -Patient has previous history of hyponatremia which is multifactorial in the setting of chronic diuretics use, SIADH and chronic PPI use.  Currently patient was on Lasix  at home.    - Low serum Osmo 259, low serum osmolarity 160 and urine sodium 17. This patient has mixed picture of hyponatremia secondary to SIADH, and use of Lasix . -Currently continuing fluid restriction -cont sodium supplement 1 g i3 times daily on 01/17/2024 -nephrology following     Prerenal acute kidney injury-setting of pneumonia  - UA showing normal specific gravity, hazy appearance nitrate positive large leukocyte esterase and few bacteria.  WBC positive. - Prerenal acute kidney injury in the setting of pneumonia.   -Nephrology following -Cr normalized   Acute cystitis - UA showing evidence of UTI.  Still pending urine culture. - Was on ceftriaxone  both for pneumonia and cystitis >>> now on p.o. Levaquin   Hypomagnesemia -replaced recently   Chronic anemia - remains hemodynamically stable   Hyperlipidemia - Continue Lipitor   History of vocal cord cancer History  of dysphonia -Patient follows otolaryngology with [facility].   Hyperglycemia -prediabetic Transient, due to IV steroids-no history of DM II Monitoring CBGs closely, with Isai coverage A1c 5.9     Essential hypertension -Continue amlodipine .  Holding Lasix  in the setting of hyponatremia -BP remains stable     Subjective: Without complaints  Physical Exam: Vitals:   01/17/24 1700 01/17/24 2040 01/18/24 0506 01/18/24 0743  BP: 110/67 108/81 (!) 126/52 (!) 142/81  Pulse: 72 74 85 88  Resp:  18 17 18   Temp: 97.7 F (36.5 C) 97.6 F (36.4 C) 97.7 F (36.5 C) 98.4 F (36.9 C)  TempSrc: Oral Oral Oral   SpO2: 97% 94% 94% 100%  Weight:      Height:       General exam: Awake, laying in bed, in nad Respiratory system: Normal respiratory effort, no wheezing Cardiovascular system: regular rate, s1, s2 Gastrointestinal system: Soft, nondistended, positive BS Central nervous system: CN2-12 grossly intact, strength intact Extremities: Perfused, no clubbing Skin: Normal skin turgor, no notable skin lesions seen Psychiatry: Mood normal // no visual hallucinations   Data Reviewed:  Labs reviewed: Na 126, K 4.0, Cr 0.98, WBC 13.5, Hgb 9.6  Family Communication: Pt in room, family not at bedside  Disposition: Status is: Inpatient Remains inpatient appropriate because: severity of illness  Planned Discharge Destination: Home    Author: Cherylle Corwin, MD 01/18/2024 11:15 AM  For on call review [email or web address].

## 2024-01-18 NOTE — Progress Notes (Signed)
 Occupational Therapy Treatment Patient Details Name: Julie Lutz MRN: [id number] DOB: Mar 12, 1941 Today's Date: 01/18/2024   History of present illness Patient is a 83 yo female presenting to the ED with chills, cough, congestion and SOB on 01/15/24.  Noted to be tachycardic, and admitted with CAP, + covid. 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 Pt supervision-modified independence for all mobility and transfers. Pt pleasant and eager to get up, self care completed while standing at sink with good stability noted. Pt requesting bathroom able to successfully void and complete hygiene and clothing management. Pt donning new socks and encouraged to use sock aid but declined, therefor requiring increased assist d/t leg discomfort. Left with breakfast tray and all needs met. Acute OT to continue to follow to address established goals to facilitate DC to next venue of care.        If plan is discharge home, recommend the following:  A little help with walking and/or transfers;A little help with bathing/dressing/bathroom;Assist for transportation;Help with stairs or ramp for entrance   Equipment Recommendations  None recommended by OT    Recommendations for Other Services      Precautions / Restrictions Precautions Precautions: Fall Recall of Precautions/Restrictions: Intact Restrictions Weight Bearing Restrictions Per Provider Order: No       Mobility Bed Mobility Overal bed mobility: Needs Assistance Bed Mobility: Sit to Supine     Supine to sit: Supervision     General bed mobility comments: used rails to pull self upward    Transfers Overall transfer level: Needs assistance Equipment used: Rolling walker (2  wheels) Transfers: Sit to/from Stand Sit to Stand: Supervision           General transfer comment: sup for safety     Balance Overall balance assessment: Needs assistance Sitting-balance support: No upper extremity supported, Feet supported Sitting balance-Leahy Scale: Good Sitting balance - Comments: EOB   Standing balance support: Reliant on assistive device for balance, During functional activity Standing balance-Leahy Scale: Fair Standing balance comment: reliant on RW                           ADL either performed or assessed with clinical judgement   ADL Overall ADL's : Needs assistance/impaired     Grooming: Oral care;Wash/dry face;Modified independent;Standing Grooming Details (indicate cue type and reason): no physical assist needed, completed at sink while standing             Lower Body Dressing: Sitting/lateral leans;Minimal assistance Lower Body Dressing Details (indicate cue type and reason): assist donning socks d/t discomfort in leg Toilet Transfer: Modified Independent;Ambulation;Regular Teacher, adult education Details (indicate cue type and reason): ambulated using RW to toilet Toileting- Clothing Manipulation and Hygiene: Modified independent;Sit to/from stand Toileting - Clothing Manipulation Details (indicate cue type and reason): able to manage gown and hygiene     Functional mobility during ADLs: Modified independent;Rolling walker (2 wheels) General ADL Comments: no physical assist throughout, increased time but able to complete ADLs without help. Pt encouraged to use sock aid to don socks d/t leg discomfort but denied    Extremity/Trunk Assessment              Vision       Perception     Praxis  Communication Communication Communication: No apparent difficulties   Cognition Arousal: Alert Behavior During Therapy: WFL for tasks assessed/performed Cognition: No apparent impairments                                Following commands: Intact        Cueing   Cueing Techniques: Verbal cues  Exercises      Shoulder Instructions       General Comments VSS on RA    Pertinent Vitals/ Pain       Pain Assessment Pain Assessment: Faces Faces Pain Scale: Hurts a little bit Pain Location: R leg Pain Descriptors / Indicators: Sore Pain Intervention(s): Monitored during session  Home Living                                          Prior Functioning/Environment              Frequency  Min 1X/week        Progress Toward Goals  OT Goals(current goals can now be found in the care plan section)  Progress towards OT goals: Progressing toward goals  Acute Rehab OT Goals Patient Stated Goal: none stated this date OT Goal Formulation: With patient Time For Goal Achievement: 01/30/24 Potential to Achieve Goals: Good ADL Goals Pt Will Perform Lower Body Bathing: with modified independence;sitting/lateral leans;sit to/from stand Pt Will Perform Lower Body Dressing: with modified independence;sit to/from stand;sitting/lateral leans Pt Will Transfer to Toilet: with modified independence;ambulating;regular height toilet;grab bars Pt Will Perform Toileting - Clothing Manipulation and hygiene: with modified independence;sitting/lateral leans;sit to/from stand Additional ADL Goal #1: Patient will be able to state 3 strategies for energy conservation in order to promote increased strength and activity tolerance at discharge. Additional ADL Goal #2: Patient will be able to complete functional task in standing for 3 minutes prior to needing seated rest break.  Plan      Co-evaluation                 AM-PAC OT "6 Clicks" Daily Activity     Outcome Measure   Help from another person eating meals?: A Little Help from another person taking care of personal grooming?: A Little Help from another person toileting, which includes using toliet, bedpan, or urinal?: A  Little Help from another person bathing (including washing, rinsing, drying)?: A Little Help from another person to put on and taking off regular upper body clothing?: A Little Help from another person to put on and taking off regular lower body clothing?: A Little 6 Click Score: 18    End of Session Equipment Utilized During Treatment: Rolling walker (2 wheels)  OT Visit Diagnosis: Unsteadiness on feet (R26.81);Other abnormalities of gait and mobility (R26.89);Muscle weakness (generalized) (M62.81)   Activity Tolerance Patient tolerated treatment well   Patient Left in bed;with call bell/phone within reach   Nurse Communication Mobility status        Time: 1610-9604 OT Time Calculation (min): 24 min  Charges: OT General Charges $OT Visit: 1 Visit OT Treatments $Self Care/Home Management : 23-37 mins  Javohn Basey, BS, OTA/S   Nolin Grell 01/18/2024, 10:22 AM

## 2024-01-19 ENCOUNTER — Other Ambulatory Visit (HOSPITAL_COMMUNITY): Payer: Self-pay

## 2024-01-19 DIAGNOSIS — J189 Pneumonia, unspecified organism: Secondary | ICD-10-CM | POA: Diagnosis not present

## 2024-01-19 DIAGNOSIS — E871 Hypo-osmolality and hyponatremia: Secondary | ICD-10-CM | POA: Diagnosis not present

## 2024-01-19 LAB — BASIC METABOLIC PANEL WITH GFR
Anion gap: 11 (ref 5–15)
BUN: 19 mg/dL (ref 8–23)
CO2: 24 mmol/L (ref 22–32)
Calcium: 8.3 mg/dL — ABNORMAL LOW (ref 8.9–10.3)
Chloride: 99 mmol/L (ref 98–111)
Creatinine, Ser: 0.91 mg/dL (ref 0.44–1.00)
GFR, Estimated: >60 mL/min (ref >60)
Glucose, Bld: 100 mg/dL — ABNORMAL HIGH (ref 70–99)
Potassium: 4 mmol/L (ref 3.5–5.1)
Sodium: 134 mmol/L — ABNORMAL LOW (ref 135–145)

## 2024-01-19 LAB — CBC
HCT: 30.8 % — ABNORMAL LOW (ref 36.0–46.0)
Hemoglobin: 10.2 g/dL — ABNORMAL LOW (ref 12.0–15.0)
MCH: 29.2 pg (ref 26.0–34.0)
MCHC: 33.1 g/dL (ref 30.0–36.0)
MCV: 88.3 fL (ref 80.0–100.0)
Platelets: 397 10*3/uL (ref 150–400)
RBC: 3.49 MIL/uL — ABNORMAL LOW (ref 3.87–5.11)
RDW: 13.7 % (ref 11.5–15.5)
WBC: 11.1 10*3/uL — ABNORMAL HIGH (ref 4.0–10.5)
nRBC: 0 % (ref 0.0–0.2)

## 2024-01-19 LAB — GLUCOSE, CAPILLARY
Glucose-Capillary: 169 mg/dL — ABNORMAL HIGH (ref 70–99)
Glucose-Capillary: 106 mg/dL — ABNORMAL HIGH (ref 70–99)

## 2024-01-19 MED ORDER — ALBUTEROL SULFATE (2.5 MG/3ML) 0.083% IN NEBU
2.5000 mg | INHALATION_SOLUTION | RESPIRATORY_TRACT | 0 refills | Status: DC | PRN
  Filled 2024-01-19: qty 90, 5d supply, fill #0

## 2024-01-19 MED ORDER — DOCUSATE SODIUM 100 MG PO CAPS
100.0000 mg | ORAL_CAPSULE | Freq: Two times a day (BID) | ORAL | Status: DC
  Administered 2024-01-19: 100 mg via ORAL
  Filled 2024-01-19: qty 1

## 2024-01-19 MED ORDER — ALBUTEROL SULFATE (2.5 MG/3ML) 0.083% IN NEBU: 2.5000 mg | INHALATION_SOLUTION | RESPIRATORY_TRACT | 0 refills | Status: DC | PRN

## 2024-01-19 MED ORDER — DOCUSATE SODIUM 100 MG PO CAPS
100.0000 mg | ORAL_CAPSULE | Freq: Two times a day (BID) | ORAL | 0 refills | Status: AC
  Filled 2024-01-19: qty 60, 30d supply, fill #0

## 2024-01-19 MED ORDER — LEVOFLOXACIN 750 MG PO TABS
750.0000 mg | ORAL_TABLET | ORAL | 0 refills | Status: AC
  Filled 2024-01-19: qty 2, 4d supply, fill #0

## 2024-01-19 MED ORDER — PREDNISONE 10 MG PO TABS
ORAL_TABLET | ORAL | 0 refills | Status: AC
  Filled 2024-01-19: qty 15, 8d supply, fill #0

## 2024-01-19 NOTE — Progress Notes (Signed)
 Julie Lutz is an 82 y.o. female hypertension, hyperlipidemia, chronic hypomg, iron deficiency anemia, osteoporosis, vocal cord cancer, chronic hyponatremia followed by Dr. Christianne Cowper p/w cough for 2 wk. Poor appetite for the days but cont to take Lasix  for the hyponatremia but is not on any sodium chloride  tablets. Cr 122 baseline 128-131 and a creatinine of 1.18. Urine osmolality was 160 and urine sodium was 17. X-ray showed a inferior right upper lobe opacity and patient treated for pneumonia.   Assessment/Plan: Hyponatremia -on examination she appears to be volume down with poor skin turgor and no edema.  Urine sodium 17 and urine osmolality 160 because patient had continued to take Lasix  at home.  Her sodium usually drops when she has an acute illness which is not surprising with pulmonary symptoms. TSH and cortisol level WNL. She's just very susceptible to hyponatremia whenever she gets a pulmonary infection. Did not tolerate Ure-na with GI upset, very poor appetite. -Component of SIADH, fluid restrict to 40oz for now; she's really not drinking that much. - Stop the NaCl tabs; no need for any more. Can restart Lasix  in the next 24-48hrs.  Signing off at this time; please reconsult as needed. She already has f/u with Dr. Christianne Cowper.    Pneumonia currently on Levaquin  with a positive coronavirus 229E. Anemia - stable H/o vocal cord cancer HTN - on amlodipine   Subjective: SOB with exertion, cough. Denies fever, chills, nausea, headaches or dizziness. Constipated, no BM in 5 days.    Chemistry and CBC: Creat  Date/Time Value Ref Range Status  11/08/2023 03:47 PM 1.09 (H) 0.60 - 0.95 mg/dL Final   Creatinine, Ser  Date/Time Value Ref Range Status  01/19/2024 05:26 AM 0.91 0.44 - 1.00 mg/dL Final  72/53/6644 03:47 AM 0.98 0.44 - 1.00 mg/dL Final  42/59/5638 75:64 AM 0.95 0.44 - 1.00 mg/dL Final  33/29/5188 41:66 AM 1.17 (H) 0.44 - 1.00 mg/dL Final  03/19/1600 09:32 PM 1.18 (H) 0.44 - 1.00 mg/dL  Final  35/57/3220 25:42 PM 1.11 0.40 - 1.20 mg/dL Final  70/62/3762 83:15 AM 1.12 0.40 - 1.20 mg/dL Final  17/61/6073 71:06 AM 1.12 0.40 - 1.20 mg/dL Final  26/94/8546 27:03 PM 0.95 0.40 - 1.20 mg/dL Final  50/05/3817 29:93 PM 0.92 0.44 - 1.00 mg/dL Final  71/69/6789 38:10 PM 1.05 0.40 - 1.20 mg/dL Final  17/51/0258 52:77 AM 0.92 0.40 - 1.20 mg/dL Final  82/42/3536 14:43 AM 0.85 0.44 - 1.00 mg/dL Final  15/40/0867 61:95 PM 0.88 0.40 - 1.20 mg/dL Final  09/32/6712 45:80 AM 1.01 0.40 - 1.20 mg/dL Final  99/83/3825 05:39 AM 1.00 0.40 - 1.20 mg/dL Final  76/73/4193 79:02 AM 1.03 (H) 0.57 - 1.00 mg/dL Final  40/97/3532 99:24 PM 0.95 0.40 - 1.20 mg/dL Final  26/83/4196 22:29 AM 0.90 0.44 - 1.00 mg/dL Final  79/89/2119 41:74 AM 0.93 0.44 - 1.00 mg/dL Final  05/03/4817 56:31 PM 1.29 (H) 0.44 - 1.00 mg/dL Final   Recent Labs  Lab 01/15/24 1353 01/16/24 0021 01/16/24 0513 01/16/24 1323 01/16/24 2116 01/17/24 0035 01/17/24 0450 01/18/24 0449 01/19/24 0526  NA 122*   < > 126* 125* 126* 125* 125* 126* 134*  K 4.0  --  3.5  --   --   --  3.4* 4.0 4.0  CL 83*  --  92*  --   --   --  92* 95* 99  CO2 25  --  23  --   --   --  24 23 24   GLUCOSE 108*  --  167*  --   --   --  141* 123* 100*  BUN 12  --  11  --   --   --  12 14 19   CREATININE 1.18*  --  1.17*  --   --   --  0.95 0.98 0.91  CALCIUM  8.8*  --  7.8*  --   --   --  7.6* 7.8* 8.3*  PHOS 2.7  --   --   --   --   --   --   --   --    < > = values in this interval not displayed.   Recent Labs  Lab 01/15/24 1353 01/16/24 0513 01/17/24 0450 01/18/24 0449 01/19/24 0526  WBC 13.0* 12.8* 13.5* 13.5* 11.1*  NEUTROABS 10.8*  --   --   --   --   HGB 11.0* 10.5* 9.9* 9.6* 10.2*  HCT 32.7* 31.2* 29.1* 28.4* 30.8*  MCV 86.3 87.2 86.9 87.1 88.3  PLT 294 268 316 377 397   Liver Function Tests: Recent Labs  Lab 01/15/24 1353  AST 17  ALT 14  ALKPHOS 96  BILITOT 0.6  PROT 6.7  ALBUMIN 3.8   No results for input(s): "LIPASE",  "AMYLASE" in the last 168 hours. No results for input(s): "AMMONIA" in the last 168 hours. Cardiac Enzymes: No results for input(s): "CKTOTAL", "CKMB", "CKMBINDEX", "TROPONINI" in the last 168 hours. Iron Studies: No results for input(s): "IRON", "TIBC", "TRANSFERRIN", "FERRITIN" in the last 72 hours. PT/INR: @LABRCNTIP (inr:5)  Xrays/Other Studies: ) Results for orders placed or performed during the hospital encounter of 01/15/24 (from the past 48 hours)  Glucose, capillary     Status: Abnormal   Collection Time: 01/17/24 12:45 PM  Result Value Ref Range   Glucose-Capillary 156 (H) 70 - 99 mg/dL    Comment: Glucose reference range applies only to samples taken after fasting for at least 8 hours.  Glucose, capillary     Status: Abnormal   Collection Time: 01/17/24  6:48 PM  Result Value Ref Range   Glucose-Capillary 137 (H) 70 - 99 mg/dL    Comment: Glucose reference range applies only to samples taken after fasting for at least 8 hours.  Glucose, capillary     Status: Abnormal   Collection Time: 01/17/24  8:17 PM  Result Value Ref Range   Glucose-Capillary 153 (H) 70 - 99 mg/dL    Comment: Glucose reference range applies only to samples taken after fasting for at least 8 hours.  Basic metabolic panel     Status: Abnormal   Collection Time: 01/18/24  4:49 AM  Result Value Ref Range   Sodium 126 (L) 135 - 145 mmol/L   Potassium 4.0 3.5 - 5.1 mmol/L   Chloride 95 (L) 98 - 111 mmol/L   CO2 23 22 - 32 mmol/L   Glucose, Bld 123 (H) 70 - 99 mg/dL    Comment: Glucose reference range applies only to samples taken after fasting for at least 8 hours.   BUN 14 8 - 23 mg/dL   Creatinine, Ser 9.52 0.44 - 1.00 mg/dL   Calcium  7.8 (L) 8.9 - 10.3 mg/dL   GFR, Estimated 57 (L) >60 mL/min    Comment: (NOTE) Calculated using the CKD-EPI Creatinine Equation (2021)    Anion gap 8 5 - 15    Comment: Performed at [facility], [address], [place], [place] [address]  CBC     Status:  Abnormal   Collection Time:  01/18/24  4:49 AM  Result Value Ref Range   WBC 13.5 (H) 4.0 - 10.5 K/uL   RBC 3.26 (L) 3.87 - 5.11 MIL/uL   Hemoglobin 9.6 (L) 12.0 - 15.0 g/dL   HCT 40.9 (L) 81.1 - 91.4 %   MCV 87.1 80.0 - 100.0 fL   MCH 29.4 26.0 - 34.0 pg   MCHC 33.8 30.0 - 36.0 g/dL   RDW 78.2 95.6 - 21.3 %   Platelets 377 150 - 400 K/uL   nRBC 0.0 0.0 - 0.2 %    Comment: Performed at [facility], [address] [place], [place] [address]  TSH     Status: None   Collection Time: 01/18/24  4:49 AM  Result Value Ref Range   TSH 0.375 0.350 - 4.500 uIU/mL    Comment: Performed by a 3rd Generation assay with a functional sensitivity of <=0.01 uIU/mL. Performed at [facility], [address] [place], [place] [address]   Glucose, capillary     Status: Abnormal   Collection Time: 01/18/24  7:48 AM  Result Value Ref Range   Glucose-Capillary 107 (H) 70 - 99 mg/dL    Comment: Glucose reference range applies only to samples taken after fasting for at least 8 hours.  Cortisol     Status: None   Collection Time: 01/18/24  8:46 AM  Result Value Ref Range   Cortisol, Plasma 3.3 ug/dL    Comment: (NOTE) AM    6.7 - 22.6 ug/dL PM   <29.5       ug/dL Performed at [facility], [address], [place], [place] [address]   Glucose, capillary     Status: Abnormal   Collection Time: 01/18/24 11:32 AM  Result Value Ref Range   Glucose-Capillary 123 (H) 70 - 99 mg/dL    Comment: Glucose reference range applies only to samples taken after fasting for at least 8 hours.  Glucose, capillary     Status: Abnormal   Collection Time: 01/18/24  4:34 PM  Result Value Ref Range   Glucose-Capillary 161 (H) 70 - 99 mg/dL    Comment: Glucose reference range applies only to samples taken after fasting for at least 8 hours.  Glucose, capillary     Status: Abnormal   Collection Time: 01/18/24  8:14 PM  Result Value Ref Range   Glucose-Capillary 122 (H) 70 - 99 mg/dL    Comment:  Glucose reference range applies only to samples taken after fasting for at least 8 hours.  Basic metabolic panel     Status: Abnormal   Collection Time: 01/19/24  5:26 AM  Result Value Ref Range   Sodium 134 (L) 135 - 145 mmol/L    Comment: DELTA CHECK NOTED   Potassium 4.0 3.5 - 5.1 mmol/L   Chloride 99 98 - 111 mmol/L   CO2 24 22 - 32 mmol/L   Glucose, Bld 100 (H) 70 - 99 mg/dL    Comment: Glucose reference range applies only to samples taken after fasting for at least 8 hours.   BUN 19 8 - 23 mg/dL   Creatinine, Ser 2.44 0.44 - 1.00 mg/dL   Calcium  8.3 (L) 8.9 - 10.3 mg/dL   GFR, Estimated >01 >02 mL/min    Comment: (NOTE) Calculated using the CKD-EPI Creatinine Equation (2021)    Anion gap 11 5 - 15    Comment: Performed at [facility], [address] [place], [place] [address]  CBC  Status: Abnormal   Collection Time: 01/19/24  5:26 AM  Result Value Ref Range   WBC 11.1 (H) 4.0 - 10.5 K/uL   RBC 3.49 (L) 3.87 - 5.11 MIL/uL   Hemoglobin 10.2 (L) 12.0 - 15.0 g/dL   HCT 16.1 (L) 09.6 - 04.5 %   MCV 88.3 80.0 - 100.0 fL   MCH 29.2 26.0 - 34.0 pg   MCHC 33.1 30.0 - 36.0 g/dL   RDW 40.9 81.1 - 91.4 %   Platelets 397 150 - 400 K/uL   nRBC 0.0 0.0 - 0.2 %    Comment: Performed at [facility], [address], [place], [place] [address]  Glucose, capillary     Status: Abnormal   Collection Time: 01/19/24  8:12 AM  Result Value Ref Range   Glucose-Capillary 169 (H) 70 - 99 mg/dL    Comment: Glucose reference range applies only to samples taken after fasting for at least 8 hours.   No results found.  PMH:   Past Medical History:  Diagnosis Date   Allergy    Arthritis    Asthma    esosinophillic   Cancer (HCC)    vocal cord   Cataract    GERD (gastroesophageal reflux disease)    Hyperlipidemia    Hypertension    Hyponatremia    Osteoporosis    Spinal stenosis     PSH:   Past Surgical History:  Procedure Laterality Date   FEMUR FRACTURE SURGERY  Right 08/2016   HERNIA REPAIR Right    inguinal   JOINT REPLACEMENT  2017   bilateral knee   LAMINECTOMY  11/2020   L3-5   LARYNGOSCOPY  11/2021   SPINAL FUSION  03/2021    Allergies: No Known Allergies  Medications:   Prior to Admission medications   Medication Sig Start Date End Date Taking? Authorizing Provider  albuterol  (PROVENTIL  HFA) 108 (90 Base) MCG/ACT inhaler Inhale 1-2 puffs into the lungs every 6 (six) hours as needed for wheezing or shortness of breath.   Yes [provider]  albuterol  (PROVENTIL ) (2.5 MG/3ML) 0.083% nebulizer solution Take 3 mLs (2.5 mg total) by nebulization every 6 (six) hours as needed for wheezing or shortness of breath. 07/30/22  Yes Aleck Hurdle, MD  ALPRAZolam  (XANAX ) 0.5 MG tablet Take 1 tablet (0.5 mg total) by mouth at bedtime as needed for anxiety. 07/27/23  Yes Christel Cousins, MD  amLODipine  (NORVASC ) 2.5 MG tablet Take 1 tablet (2.5 mg total) by mouth daily. 03/30/23  Yes Christel Cousins, MD  atorvastatin  (LIPITOR) 10 MG tablet Take 1 tablet (10 mg total) by mouth daily. 02/03/23  Yes Sonny Dust, MD  carvedilol  (COREG ) 25 MG tablet Take 1 tablet (25 mg total) by mouth 2 (two) times daily with a meal. 04/27/23  Yes Christel Cousins, MD  Coenzyme Q10 (COQ-10 PO) Take 1 capsule by mouth daily.   Yes [provider]  Cyanocobalamin  (VITAMIN B 12 PO) Take 1 tablet by mouth daily.   Yes [provider]  FASENRA  PEN 30 MG/ML prefilled autoinjector INJECT 1 PEN UNDER THE SKIN EVERY 8 WEEKS 10/25/23  Yes Aleck Hurdle, MD  fluticasone -salmeterol (ADVAIR) 250-50 MCG/ACT AEPB Inhale 1 puff into the lungs in the morning and at bedtime. 01/12/24  Yes Aleck Hurdle, MD  furosemide  (LASIX ) 40 MG tablet Take 40 mg by mouth daily.   Yes [provider]  Magnesium  Oxide (MAG-OXIDE PO) Take 1 tablet by mouth  daily.   Yes [provider]  mirabegron  ER (MYRBETRIQ ) 50 MG TB24 tablet Take 1 tablet (50 mg  total) by mouth daily. 04/27/23  Yes Christel Cousins, MD  mirtazapine  (REMERON ) 7.5 MG tablet Take 1 tablet (7.5 mg total) by mouth at bedtime. 03/30/23  Yes Christel Cousins, MD  predniSONE  [facility] UNI-PAK 21 TAB) 10 MG (21) TBPK tablet Take as directed 01/01/24  Yes Acevedo, Angela, PA  PREVACID  30 MG capsule Take 1 capsule (30 mg total) by mouth daily at 12 noon. 07/27/23  Yes Christel Cousins, MD  tiotropium (SPIRIVA) 18 MCG inhalation capsule Place 18 mcg into inhaler and inhale daily.   Yes [provider]  Vitamin D , Ergocalciferol , 50 MCG (2000 UT) CAPS Take by mouth daily at 6 (six) AM.   Yes [provider]  benzonatate  (TESSALON ) 100 MG capsule Take 1 capsule (100 mg total) by mouth every 8 (eight) hours. Patient not taking: Reported on 01/15/2024 01/01/24   Acevedo, Angela, PA    Discontinued Meds:   Medications Discontinued During This Encounter  Medication Reason   albuterol  (VENTOLIN  HFA) 108 (90 Base) MCG/ACT inhaler 2 puff P&T Policy: Therapeutic Substitute   albuterol  (PROVENTIL ) (2.5 MG/3ML) 0.083% nebulizer solution 2.5 mg    0.9 %  sodium chloride  infusion    azelastine  (ASTELIN ) 0.1 % nasal spray Patient Preference   Ferrous Sulfate (IRON PO) Patient Preference   ondansetron  (ZOFRAN -ODT) 4 MG disintegrating tablet Patient Preference   Tiotropium Bromide Monohydrate (SPIRIVA RESPIMAT) 1.25 MCG/ACT AERS Duplicate   fluticasone  furoate-vilanterol (BREO ELLIPTA ) 200-25 MCG/ACT 1 puff    benzonatate  (TESSALON ) capsule 100 mg    remdesivir  200 mg in sodium chloride  0.9% 250 mL IVPB Discontinued by provider   remdesivir  100 mg in sodium chloride  0.9 % 100 mL IVPB Discontinued by provider   atorvastatin  (LIPITOR) tablet 10 mg    nirmatrelvir /ritonavir  (renal dosing) (PAXLOVID ) 2 tablet    doxycycline  (VIBRAMYCIN ) 100 mg in sodium chloride  0.9 % 250 mL IVPB    sodium chloride  tablet 1 g    pantoprazole  (PROTONIX ) EC tablet 40 mg    cefTRIAXone  (ROCEPHIN ) 2 g  in sodium chloride  0.9 % 100 mL IVPB    doxycycline  (VIBRA -TABS) tablet 100 mg    levofloxacin  (LEVAQUIN ) tablet 750 mg    guaiFENesin -dextromethorphan  (ROBITUSSIN DM) 100-10 MG/5ML syrup 15 mL    methylPREDNISolone  sodium succinate (SOLU-MEDROL ) 40 mg/mL injection 40 mg    guaiFENesin -dextromethorphan  (ROBITUSSIN DM) 100-10 MG/5ML syrup 15 mL    urea  (URE-NA) oral packet 15 g    sodium chloride  tablet 1 g     Social History:  reports that she quit smoking about 35 years ago. Her smoking use included cigarettes. She has never been exposed to tobacco smoke. She has never used smokeless tobacco. She reports current alcohol use of about 5.0 standard drinks of alcohol per week. She reports that she does not use drugs.  Family History:   Family History  Problem Relation Age of Onset   Diabetes Sister    Cancer Sister 20       ovarian   Anemia Sister    Allergies Daughter    Asthma Neg Hx     Blood pressure 106/79, pulse 89, temperature 98.5 F (36.9 C), temperature source Oral, resp. rate 18, height 4\' 8"  (1.422 m), weight 61.4 kg, SpO2 97%. Physical Exam: General appearance: alert, cooperative, NAD Head: NCAT Resp: rhonchi bibasilar Cardio: RRR GI: SNDNT +BS Extremities: edema none Skin: dry turgor  Patrick Boor, MD 01/19/2024, 8:51 AM

## 2024-01-19 NOTE — Progress Notes (Signed)
 Mobility Specialist Progress Note:    01/19/24 0815  Therapy Vitals  Temp 98.5 F (36.9 C)  Temp Source Oral  Pulse Rate 89  BP 106/79  Patient Position (if appropriate) Lying  Oxygen Therapy  SpO2 97 %  Mobility  Activity Ambulated with assistance in hallway  Level of Assistance Standby assist, set-up cues, supervision of patient - no hands on  Assistive Device Front wheel walker  Distance Ambulated (ft) 100 ft  Activity Response Tolerated well  Mobility Referral Yes  Mobility visit 1 Mobility  Mobility Specialist Start Time (ACUTE ONLY) 0815  Mobility Specialist Stop Time (ACUTE ONLY) [id number]  Mobility Specialist Time Calculation (min) (ACUTE ONLY) 8 min   Received pt in bed having no complaints and agreeable to mobility. Pt was asymptomatic throughout ambulation and returned to room w/o fault. Left in bed w/ call bell in reach and all needs met.   D'Vante Nolon Baxter Mobility Specialist Please contact via Special educational needs teacher or Rehab office at [phone]

## 2024-01-19 NOTE — Discharge Summary (Signed)
 Physician Discharge Summary   Patient: Julie Lutz MRN: [id number] DOB: 06-Apr-1941  Admit date:     01/15/2024  Discharge date: 01/19/24  Discharge Physician: Cherylle Corwin   PCP: Christel Cousins, MD   Recommendations at discharge:    Follow up with PCP in 1-2 weeks  Discharge Diagnoses: Principal Problem:   CAP (community acquired pneumonia) Active Problems:   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   Acute cystitis   Essential hypertension   Asthma, chronic   History of vocal cord cancer (HCC)   Chronic anemia   Hypomagnesemia   SIADH (syndrome of inappropriate ADH production) (HCC)   Hyperlipidemia   Asthma, chronic obstructive, with acute exacerbation (HCC)   AKI (acute kidney injury) (HCC)   Hyperglycemia  Resolved Problems:   Pneumonia due to COVID-19 virus  Hospital Course: Julie Lutz is a 83 y.o. 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  Initially has been seen in urgent care treated with a steroid improved however for last 2 days symptom has been worsened developed fever, chills, congestion and shortness of breath.   Daughter at the bedside reported that lately patient has been drinking a lot of water as she continues to cough and complaining of shortness of breath and feels thirsty. Patient is complaining about productive cough, shortness of breath and wheezing.  Denies any fever fever, chill, chest pain, and palpitation.  Denies any difficulty swallowing and sensation of throat closure. No other complaint at this time.   ED Course: Patient was tachycardic, otherwise  hemodynamically stable. CMP showing low sodium 122.  (Baseline sodium is around 128-131), elevated creatinine 1.18.- Low magnesium  0.8.  Normal phosphorus level. Low serum osmolarity 259, low urine osmolality 160, and urine sodium 17. CBC showing leukocytosis 13, stable H&H and normal platelet count.   Chest x-ray showed inferior right upper lobe opacity concerning for pneumonia.  Stable cardiomegaly.  Stable linear atelectasis.   Was treated with albuterol  nebulizer, DuoNeb nebulizer and mag sulfate 2 g.   Hospitalist has been consulted and patient has been transferred [facility] for the management of hyponatremia, hypomagnesemia and pneumonia.  Assessment and Plan: Community-acquired pneumonia- Acute asthma exacerbation - Initially noted have shortness of breath with exertion, -cough improved, remains hemodynamically stable. - Influenza A/B, SARS-CoV-2 negative, pos for coronavirus 229E - Chest x-ray showing evidence of pneumonia.   - Positive Coronavirus 229E  - initially on continue IV ceftriaxone  and doxycycline , pt later changed to Levaquin  to complete course - received IV Solu-Medrol . Cont pred taper on d/c -At home patient is not any long-acting bronchodilator.  -continue Breo Ellipta  twice daily and continue albuterol  nebulizer as needed.  Continue supportive care. - Pro-Cal 0.22    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- History of SIADH -Patient has previous history of hyponatremia which is multifactorial in the setting of chronic diuretics use, SIADH and chronic PPI use.  Currently patient was on Lasix  at home.    - Low serum Osmo 259, low serum osmolarity 160 and urine sodium 17. This patient has mixed picture of hyponatremia secondary to SIADH, and use of Lasix . -sodium normalized by day of d/c. Per Nephrology, can resume lasix  in 24-48h after d/c  Prerenal acute kidney injury-setting of pneumonia   - UA showing normal specific gravity, hazy  appearance nitrate positive large leukocyte esterase and few bacteria.  WBC positive. - Prerenal acute kidney injury in the setting of pneumonia.   -Nephrology had been following -Cr normalized   Acute cystitis - UA showing evidence of UTI.   - Was on ceftriaxone  both for pneumonia and cystitis >>> now on p.o. Levaquin  to complete course   Hypomagnesemia -replaced   Chronic anemia - remains hemodynamically stable   Hyperlipidemia - Continue Lipitor   History of vocal cord cancer History of dysphonia -Patient follows otolaryngology with [facility].   Hyperglycemia -prediabetic Transient, due to IV steroids-no history of DM II    Essential hypertension -Continue amlodipine .  Held Lasix  in the setting of hyponatremia -BP remained stable        Consultants: Nephrology Procedures performed:   Disposition: Home Diet recommendation:  Regular diet DISCHARGE MEDICATION: Allergies as of 01/19/2024   No Known Allergies      Medication List     PAUSE taking these medications    furosemide  40 MG tablet Wait to take this until: Jan 21, 2024 Commonly known as: LASIX  Take 40 mg by mouth daily.       STOP taking these medications    benzonatate  100 MG capsule Commonly known as: TESSALON    predniSONE  10 MG (21) Tbpk tablet Commonly known as: STERAPRED UNI-PAK 21 TAB Replaced by: predniSONE  10 MG tablet       TAKE these medications    ALPRAZolam  0.5 MG tablet Commonly known as: XANAX  Take 1 tablet (0.5 mg total) by mouth at bedtime as needed for anxiety.   amLODipine  2.5 MG tablet Commonly known as: NORVASC  Take 1 tablet (2.5 mg total) by mouth daily.   atorvastatin  10 MG tablet Commonly known as: LIPITOR Take 1 tablet (10 mg total) by mouth daily.   carvedilol  25 MG tablet Commonly known as: COREG  Take 1 tablet (25 mg total) by mouth 2 (two) times daily with a meal.   COQ-10 PO Take 1 capsule by mouth daily.   docusate sodium  100 MG  capsule Commonly known as: COLACE Take 1 capsule (100 mg total) by mouth 2 (two) times daily.   Fasenra  Pen 30 MG/ML prefilled autoinjector Generic drug: benralizumab  INJECT 1 PEN UNDER THE SKIN EVERY 8 WEEKS   fluticasone -salmeterol 250-50 MCG/ACT Aepb Commonly known as: ADVAIR Inhale 1 puff into the lungs in the morning and at bedtime.   levofloxacin  750 MG tablet Commonly known as: LEVAQUIN  Take 1 tablet (750 mg total) by mouth every other day for 2 days. Start taking on: Jan 20, 2024   MAG-OXIDE PO Take 1 tablet by mouth daily.   mirabegron  ER 50 MG Tb24 tablet Commonly known as: MYRBETRIQ  Take 1 tablet (50 mg total) by mouth daily.   mirtazapine  7.5 MG tablet Commonly known as: REMERON  Take 1 tablet (7.5 mg total) by mouth at bedtime.   predniSONE  10 MG tablet Commonly known as: DELTASONE  Take 4 tablets (40 mg total) by mouth daily with breakfast for 2 days, THEN 2 tablets (20 mg total) daily with breakfast for 2 days, THEN 1 tablet (10 mg total) daily with breakfast for 2 days, THEN 0.5 tablets (5 mg total) daily with breakfast for 2 days. Start taking on: Jan 19, 2024 Replaces: predniSONE  10 MG (21) Tbpk tablet   Prevacid  30 MG capsule Generic drug: lansoprazole  Take 1 capsule (30 mg total) by mouth daily at  12 noon.   Proventil  HFA 108 (90 Base) MCG/ACT inhaler Generic drug: albuterol  Inhale 1-2 puffs into the lungs every 6 (six) hours as needed for wheezing or shortness of breath. What changed: Another medication with the same name was added. Make sure you understand how and when to take each.   albuterol  (2.5 MG/3ML) 0.083% nebulizer solution Commonly known as: PROVENTIL  Take 3 mLs (2.5 mg total) by nebulization every 6 (six) hours as needed for wheezing or shortness of breath. What changed: Another medication with the same name was added. Make sure you understand how and when to take each.   albuterol  (2.5 MG/3ML) 0.083% nebulizer solution Commonly known as:  PROVENTIL  Take 3 mLs (2.5 mg total) by nebulization every 4 (four) hours as needed for wheezing or shortness of breath. What changed: You were already taking a medication with the same name, and this prescription was added. Make sure you understand how and when to take each.   tiotropium 18 MCG inhalation capsule Commonly known as: SPIRIVA Place 18 mcg into inhaler and inhale daily.   VITAMIN B 12 PO Take 1 tablet by mouth daily.   Vitamin D  (Ergocalciferol ) 50 MCG (2000 UT) Caps Take by mouth daily at 6 (six) AM.        Follow-up Information     Christel Cousins, MD Follow up in 2 week(s).   Specialty: Family Medicine Why: Hospital follow up Contact information: [address] [place] [address] [phone]                Discharge Exam: Julie Lutz Weights   01/15/24 1348 01/19/24 0410  Weight: 58.5 kg 61.4 kg   General exam: Awake, laying in bed, in nad Respiratory system: Normal respiratory effort, no wheezing Cardiovascular system: regular rate, s1, s2 Gastrointestinal system: Soft, nondistended, positive BS Central nervous system: CN2-12 grossly intact, strength intact Extremities: Perfused, no clubbing Skin: Normal skin turgor, no notable skin lesions seen Psychiatry: Mood normal // no visual hallucinations   Condition at discharge: fair  The results of significant diagnostics from this hospitalization (including imaging, microbiology, ancillary and laboratory) are listed below for reference.   Imaging Studies: DG Chest 2 View Result Date: 01/15/2024 CLINICAL DATA:  Cough and congestion for 3 weeks. EXAM: CHEST - 2 VIEW COMPARISON:  Two-view chest x-ray 04/06/2023. FINDINGS: The heart is enlarged. Atherosclerotic calcifications are present at the aortic arch. Linear atelectasis or scarring in the left lower lobe is stable. New inferior right upper lobe airspace opacity is present. No significant edema or effusion is present. Exaggerated thoracic  kyphosis is again noted. Lumbar fusion is noted. IMPRESSION: 1. New inferior right upper lobe airspace opacity concerning for pneumonia. 2. Stable cardiomegaly without failure. 3. Stable linear atelectasis or scarring in the left lower lobe. Electronically Signed   By: Audree Leas M.D.   On: 01/15/2024 12:42    Microbiology: Results for orders placed or performed during the hospital encounter of 01/15/24  Respiratory (~20 pathogens) panel by PCR     Status: Abnormal   Collection Time: 01/15/24  8:49 PM   Specimen: Nasopharyngeal Swab; Respiratory  Result Value Ref Range Status   Adenovirus NOT DETECTED NOT DETECTED Final   Coronavirus 229E DETECTED (A) NOT DETECTED Final    Comment: (NOTE) The Coronavirus on the Respiratory Panel, DOES NOT test for the novel  Coronavirus (2019 nCoV)    Coronavirus HKU1 NOT DETECTED NOT DETECTED Final   Coronavirus NL63 NOT DETECTED NOT DETECTED Final  Coronavirus OC43 NOT DETECTED NOT DETECTED Final   Metapneumovirus NOT DETECTED NOT DETECTED Final   Rhinovirus / Enterovirus NOT DETECTED NOT DETECTED Final   Influenza A NOT DETECTED NOT DETECTED Final   Influenza B NOT DETECTED NOT DETECTED Final   Parainfluenza Virus 1 NOT DETECTED NOT DETECTED Final   Parainfluenza Virus 2 NOT DETECTED NOT DETECTED Final   Parainfluenza Virus 3 NOT DETECTED NOT DETECTED Final   Parainfluenza Virus 4 NOT DETECTED NOT DETECTED Final   Respiratory Syncytial Virus NOT DETECTED NOT DETECTED Final   Bordetella pertussis NOT DETECTED NOT DETECTED Final   Bordetella Parapertussis NOT DETECTED NOT DETECTED Final   Chlamydophila pneumoniae NOT DETECTED NOT DETECTED Final   Mycoplasma pneumoniae NOT DETECTED NOT DETECTED Final    Comment: Performed at [facility], [address], [place], [place] [address]  Culture, blood (routine x 2) Call MD if unable to obtain prior to antibiotics being given     Status: None (Preliminary result)   Collection Time:  01/15/24  8:56 PM   Specimen: BLOOD LEFT ARM  Result Value Ref Range Status   Specimen Description BLOOD LEFT ARM  Final   Special Requests   Final    BOTTLES DRAWN AEROBIC AND ANAEROBIC Blood Culture results may not be optimal due to an inadequate volume of blood received in culture bottles   Culture   Final    NO GROWTH 4 DAYS Performed at [facility], [address], [place], [place] [address]    Report Status PENDING  Incomplete  Culture, blood (routine x 2) Call MD if unable to obtain prior to antibiotics being given     Status: None (Preliminary result)   Collection Time: 01/15/24  8:58 PM   Specimen: BLOOD RIGHT HAND  Result Value Ref Range Status   Specimen Description BLOOD RIGHT HAND  Final   Special Requests   Final    BOTTLES DRAWN AEROBIC AND ANAEROBIC Blood Culture adequate volume   Culture   Final    NO GROWTH 4 DAYS Performed at [facility], [address] [place], [place] [address]    Report Status PENDING  Incomplete  Resp panel by RT-PCR (RSV, Flu A&B, Covid) Anterior Nasal Swab     Status: None   Collection Time: 01/16/24 12:48 PM   Specimen: Anterior Nasal Swab  Result Value Ref Range Status   SARS Coronavirus 2 by RT PCR NEGATIVE NEGATIVE Final   Influenza A by PCR NEGATIVE NEGATIVE Final   Influenza B by PCR NEGATIVE NEGATIVE Final    Comment: (NOTE) The Xpert Xpress SARS-CoV-2/FLU/RSV plus assay is intended as an aid in the diagnosis of influenza from Nasopharyngeal swab specimens and should not be used as a sole basis for treatment. Nasal washings and aspirates are unacceptable for Xpert Xpress SARS-CoV-2/FLU/RSV testing.  Fact Sheet for Patients: BloggerCourse.com  Fact Sheet for Healthcare Providers: SeriousBroker.it  This test is not yet approved or cleared by the [place]  FDA and has been authorized for detection and/or diagnosis of SARS-CoV-2 by FDA under an Emergency Use  Authorization (EUA). This EUA will remain in effect (meaning this test can be used) for the duration of the COVID-19 declaration under Section 564(b)(1) of the Act, 21 U.S.C. section 360bbb-3(b)(1), unless the authorization is terminated or revoked.     Resp Syncytial Virus by PCR NEGATIVE NEGATIVE Final    Comment: (NOTE) Fact Sheet for Patients: BloggerCourse.com  Fact Sheet for Healthcare Providers: SeriousBroker.it  This test is not yet approved or cleared by the [place]  FDA and has been authorized for detection and/or diagnosis of SARS-CoV-2 by FDA under an Emergency Use Authorization (EUA). This EUA will remain in effect (meaning this test can be used) for the duration of the COVID-19 declaration under Section 564(b)(1) of the Act, 21 U.S.C. section 360bbb-3(b)(1), unless the authorization is terminated or revoked.  Performed at [facility], [address], [place], [place] [address]     Labs: CBC: Recent Labs  Lab 01/15/24 1353 01/16/24 0513 01/17/24 0450 01/18/24 0449 01/19/24 0526  WBC 13.0* 12.8* 13.5* 13.5* 11.1*  NEUTROABS 10.8*  --   --   --   --   HGB 11.0* 10.5* 9.9* 9.6* 10.2*  HCT 32.7* 31.2* 29.1* 28.4* 30.8*  MCV 86.3 87.2 86.9 87.1 88.3  PLT 294 268 316 377 397   Basic Metabolic Panel: Recent Labs  Lab 01/15/24 1353 01/16/24 0021 01/16/24 0513 01/16/24 1323 01/16/24 2116 01/17/24 0035 01/17/24 0450 01/18/24 0449 01/19/24 0526  NA 122* 126* 126*   < > 126* 125* 125* 126* 134*  K 4.0  --  3.5  --   --   --  3.4* 4.0 4.0  CL 83*  --  92*  --   --   --  92* 95* 99  CO2 25  --  23  --   --   --  24 23 24   GLUCOSE 108*  --  167*  --   --   --  141* 123* 100*  BUN 12  --  11  --   --   --  12 14 19   CREATININE 1.18*  --  1.17*  --   --   --  0.95 0.98 0.91  CALCIUM  8.8*  --  7.8*  --   --   --  7.6* 7.8* 8.3*  MG 0.8* 2.9*  --   --   --   --   --   --   --   PHOS 2.7  --   --    --   --   --   --   --   --    < > = values in this interval not displayed.   Liver Function Tests: Recent Labs  Lab 01/15/24 1353  AST 17  ALT 14  ALKPHOS 96  BILITOT 0.6  PROT 6.7  ALBUMIN 3.8   CBG: Recent Labs  Lab 01/18/24 1132 01/18/24 1634 01/18/24 2014 01/19/24 0812 01/19/24 1141  GLUCAP 123* 161* 122* 169* 106*    Discharge time spent: less than 30 minutes.  Signed: Cherylle Corwin, MD [facility] 01/19/2024

## 2024-01-20 ENCOUNTER — Other Ambulatory Visit: Payer: Self-pay

## 2024-01-20 ENCOUNTER — Telehealth: Payer: Self-pay | Admitting: *Deleted

## 2024-01-20 LAB — CULTURE, BLOOD (ROUTINE X 2)
Culture: NO GROWTH
Culture: NO GROWTH
Special Requests: ADEQUATE

## 2024-01-20 MED ORDER — ATORVASTATIN CALCIUM 10 MG PO TABS: 10.0000 mg | ORAL_TABLET | Freq: Every day | ORAL | 1 refills | Status: DC

## 2024-01-20 NOTE — Transitions of Care (Post Inpatient/ED Visit) (Signed)
   01/20/2024  Name: Julie Lutz MRN: [id number] DOB: 1941/02/03  Today's TOC FU Call Status: Today's TOC FU Call Status:: Unsuccessful Call (1st Attempt) Unsuccessful Call (1st Attempt) Date: 01/20/24  Attempted to reach the patient regarding the most recent Inpatient/ED visit.  Follow Up Plan: Additional outreach attempts will be made to reach the patient to complete the Transitions of Care (Post Inpatient/ED visit) call.   Cecilie Coffee [facility] [place], BSN RN Care Manager/ Transition of Care [place]/ [facility] [phone]

## 2024-01-23 ENCOUNTER — Telehealth: Payer: Self-pay | Admitting: *Deleted

## 2024-01-23 NOTE — Transitions of Care (Post Inpatient/ED Visit) (Signed)
   01/23/2024  Name: Julie Lutz MRN: [id number] DOB: Oct 10, 1940  Today's TOC FU Call Status: Today's TOC FU Call Status:: Unsuccessful Call (2nd Attempt) Unsuccessful Call (2nd Attempt) Date: 01/23/24  Attempted to reach the patient regarding the most recent Inpatient/ED visit.  Follow Up Plan: Additional outreach attempts will be made to reach the patient to complete the Transitions of Care (Post Inpatient/ED visit) call.   Cecilie Coffee [facility], BSN RN Care Manager/ Transition of Care [place]/ [facility] [phone]

## 2024-01-24 ENCOUNTER — Telehealth: Payer: Self-pay | Admitting: *Deleted

## 2024-01-24 NOTE — Transitions of Care (Post Inpatient/ED Visit) (Signed)
 01/24/2024  Name: Julie Lutz MRN: [id number] DOB: 29-Jun-1941  Today's TOC FU Call Status: Today's TOC FU Call Status:: Successful TOC FU Call Completed TOC FU Call Complete Date: 01/24/24 Patient's Name and Date of Birth confirmed.  Transition Care Management Follow-up Telephone Call Date of Discharge: 01/19/24 Discharge Facility: [facility]) Type of Discharge: Inpatient Admission Primary Inpatient Discharge Diagnosis:: hyponatremia/community acquired pneumonia How have you been since you were released from the hospital?: Better (slowly feeling better) Any questions or concerns?: No  Items Reviewed: Did you receive and understand the discharge instructions provided?: Yes Medications obtained,verified, and reconciled?: Yes (Medications Reviewed) Any new allergies since your discharge?: No Dietary orders reviewed?: No Do you have support at home?: Yes People in Home [RPT]: alone Name of Support/Comfort Primary Source: Bartholomew Light  Medications Reviewed Today: Medications Reviewed Today     Reviewed by Eilene Grater, RN (Case Manager) on 01/24/24 at 1214  Med List Status: <None>   Medication Order Taking? Sig Documenting Provider Last Dose Status Informant  albuterol  (PROVENTIL  HFA) 108 (90 Base) MCG/ACT inhaler [id number] Yes Inhale 1-2 puffs into the lungs every 6 (six) hours as needed for wheezing or shortness of breath. [provider] Taking Active Self, Pharmacy Records  albuterol  (PROVENTIL ) (2.5 MG/3ML) 0.083% nebulizer solution [id number] Yes Take 3 mLs (2.5 mg total) by nebulization every 6 (six) hours as needed for wheezing or shortness of breath. Desai, Nikita S, MD Taking Active Self, Pharmacy Records           Med Note Cornelia Dieter, SEBASTIAN   Sun Jan 15, 2024 10:03 PM) Patient reports she needs a refill of this medication - current supply expired  albuterol  (PROVENTIL ) (2.5 MG/3ML) 0.083% nebulizer solution [id number] Yes Take 3 mLs (2.5 mg total) by  nebulization every 4 (four) hours as needed for wheezing or shortness of breath. Oral Billings, MD Taking Active   ALPRAZolam  (XANAX ) 0.5 MG tablet [id number] Yes Take 1 tablet (0.5 mg total) by mouth at bedtime as needed for anxiety. Christel Cousins, MD Taking Active Self, Pharmacy Records  amLODipine  (NORVASC ) 2.5 MG tablet [id number] Yes Take 1 tablet (2.5 mg total) by mouth daily. Christel Cousins, MD Taking Active Self, Pharmacy Records  atorvastatin  (LIPITOR) 10 MG tablet [id number] Yes Take 1 tablet (10 mg total) by mouth daily. Sheryle Donning, MD Taking Active   carvedilol  (COREG ) 25 MG tablet [id number] Yes Take 1 tablet (25 mg total) by mouth 2 (two) times daily with a meal. Christel Cousins, MD Taking Active Self, Pharmacy Records  Coenzyme Q10 (COQ-10 PO) [id number] Yes Take 1 capsule by mouth daily. [provider] Taking Active Self, Pharmacy Records  Cyanocobalamin  (VITAMIN B 12 PO) [id number] Yes Take 1 tablet by mouth daily. [provider] Taking Active Self, Pharmacy Records  docusate sodium  (COLACE) 100 MG capsule [id number] Yes Take 1 capsule (100 mg total) by mouth 2 (two) times daily. Oral Billings, MD Taking Active   FASENRA  PEN 30 MG/ML prefilled autoinjector [id number] Yes INJECT 1 PEN UNDER THE SKIN EVERY 8 WEEKS Desai, Nikita S, MD Taking Active Self, Pharmacy Records  fluticasone -salmeterol (ADVAIR) 250-50 MCG/ACT AEPB [id number] Yes Inhale 1 puff into the lungs in the morning and at bedtime. Desai, Nikita S, MD Taking Active Self, Pharmacy Records  furosemide  (LASIX ) 40 MG tablet [id number] Yes Take 40 mg by mouth daily. [provider] Taking Active Self, Pharmacy Records  Magnesium  Oxide (MAG-OXIDE PO) [id number] Yes Take 1 tablet by mouth daily. [provider] Taking Active Self, Pharmacy Records  mirabegron  ER (MYRBETRIQ ) 50 MG TB24 tablet [id number] Yes Take 1 tablet (50 mg total) by mouth daily. Christel Cousins, MD Taking Active  Self, Pharmacy Records  mirtazapine  (REMERON ) 7.5 MG tablet [id number] Yes Take 1 tablet (7.5 mg total) by mouth at bedtime. Christel Cousins, MD Taking Active Self, Pharmacy Records  predniSONE  (DELTASONE ) 10 MG tablet [id number] Yes Take 4 tablets (40 mg total) by mouth daily with breakfast for 2 days, THEN 2 tablets (20 mg total) daily with breakfast for 2 days, THEN 1 tablet (10 mg total) daily with breakfast for 2 days, THEN 0.5 tablets (5 mg total) daily with breakfast for 2 days. Oral Billings, MD Taking Active   PREVACID  30 MG capsule [id number] Yes Take 1 capsule (30 mg total) by mouth daily at 12 noon. Christel Cousins, MD Taking Active Self, Pharmacy Records  tiotropium (SPIRIVA) 18 MCG inhalation capsule [id number] Yes Place 18 mcg into inhaler and inhale daily. [provider] Taking Active Self, Pharmacy Records  Vitamin D , Ergocalciferol , 50 MCG (2000 UT) CAPS [id number] Yes Take by mouth daily at 6 (six) AM. [provider] Taking Active Self, Pharmacy Records            Home Care and Equipment/Supplies: Were Home Health Services Ordered?: NA Any new equipment or medical supplies ordered?: NA  Functional Questionnaire: Do you need assistance with bathing/showering or dressing?: No Do you need assistance with meal preparation?: No Do you have difficulty maintaining continence: No Do you need assistance with getting out of bed/getting out of a chair/moving?: No Do you have difficulty managing or taking your medications?: No  Follow up appointments reviewed: PCP Follow-up appointment confirmed?: Yes Date of PCP follow-up appointment?: 01/26/24 Follow-up Provider: [facility] Follow-up appointment confirmed?: NA Do you need transportation to your follow-up appointment?: No Do you understand care options if your condition(s) worsen?: Yes-patient verbalized understanding  SDOH Interventions Today    Flowsheet Row Most Recent Value  SDOH  Interventions   Food Insecurity Interventions Intervention Not Indicated  Housing Interventions Intervention Not Indicated  Transportation Interventions Intervention Not Indicated, Patient Resources (Friends/Family)  Utilities Interventions Intervention Not Indicated       Una Ganser BSN RN [facility] Health Care Management Coordinator [email or web address] Direct Dial: [phone]  Fax: [phone] Website: [email or web address]

## 2024-01-26 ENCOUNTER — Ambulatory Visit: Payer: Medicare Other | Admitting: Family Medicine

## 2024-01-26 ENCOUNTER — Encounter: Payer: Self-pay | Admitting: Family Medicine

## 2024-01-26 VITALS — BP 108/73 | HR 69 | Temp 97.5°F | Resp 18 | Ht <= 58 in | Wt 132.2 lb

## 2024-01-26 DIAGNOSIS — D649 Anemia, unspecified: Secondary | ICD-10-CM

## 2024-01-26 DIAGNOSIS — I10 Essential (primary) hypertension: Secondary | ICD-10-CM

## 2024-01-26 DIAGNOSIS — E7800 Pure hypercholesterolemia, unspecified: Secondary | ICD-10-CM | POA: Diagnosis not present

## 2024-01-26 DIAGNOSIS — E222 Syndrome of inappropriate secretion of antidiuretic hormone: Secondary | ICD-10-CM

## 2024-01-26 DIAGNOSIS — J189 Pneumonia, unspecified organism: Secondary | ICD-10-CM

## 2024-01-26 DIAGNOSIS — J441 Chronic obstructive pulmonary disease with (acute) exacerbation: Secondary | ICD-10-CM

## 2024-01-26 DIAGNOSIS — K5900 Constipation, unspecified: Secondary | ICD-10-CM

## 2024-01-26 DIAGNOSIS — K219 Gastro-esophageal reflux disease without esophagitis: Secondary | ICD-10-CM

## 2024-01-26 DIAGNOSIS — F5101 Primary insomnia: Secondary | ICD-10-CM | POA: Diagnosis not present

## 2024-01-26 DIAGNOSIS — Z8639 Personal history of other endocrine, nutritional and metabolic disease: Secondary | ICD-10-CM

## 2024-01-26 DIAGNOSIS — Z8709 Personal history of other diseases of the respiratory system: Secondary | ICD-10-CM

## 2024-01-26 LAB — COMPREHENSIVE METABOLIC PANEL WITH GFR
ALT: 14 U/L (ref 0–35)
AST: 11 U/L (ref 0–37)
Albumin: 3.6 g/dL (ref 3.5–5.2)
Alkaline Phosphatase: 64 U/L (ref 39–117)
BUN: 23 mg/dL (ref 6–23)
CO2: 29 meq/L (ref 19–32)
Calcium: 9 mg/dL (ref 8.4–10.5)
Chloride: 91 meq/L — ABNORMAL LOW (ref 96–112)
Creatinine, Ser: 1.09 mg/dL (ref 0.40–1.20)
GFR: 46.38 mL/min — ABNORMAL LOW (ref >60.00)
Glucose, Bld: 90 mg/dL (ref 70–99)
Potassium: 3.7 meq/L (ref 3.5–5.1)
Sodium: 127 meq/L — ABNORMAL LOW (ref 135–145)
Total Bilirubin: 0.6 mg/dL (ref 0.2–1.2)
Total Protein: 6.1 g/dL (ref 6.0–8.3)

## 2024-01-26 LAB — CBC WITH DIFFERENTIAL/PLATELET
Basophils Absolute: 0 10*3/uL (ref 0.0–0.1)
Basophils Relative: 0.1 % (ref 0.0–3.0)
Eosinophils Absolute: 0 10*3/uL (ref 0.0–0.7)
Eosinophils Relative: 0 % (ref 0.0–5.0)
HCT: 31.2 % — ABNORMAL LOW (ref 36.0–46.0)
Hemoglobin: 10.3 g/dL — ABNORMAL LOW (ref 12.0–15.0)
Lymphocytes Relative: 15.9 % (ref 12.0–46.0)
Lymphs Abs: 1.9 10*3/uL (ref 0.7–4.0)
MCHC: 33.1 g/dL (ref 30.0–36.0)
MCV: 89.2 fl (ref 78.0–100.0)
Monocytes Absolute: 1.3 10*3/uL — ABNORMAL HIGH (ref 0.1–1.0)
Monocytes Relative: 10.7 % (ref 3.0–12.0)
Neutro Abs: 8.7 10*3/uL — ABNORMAL HIGH (ref 1.4–7.7)
Neutrophils Relative %: 73.3 % (ref 43.0–77.0)
Platelets: 433 10*3/uL — ABNORMAL HIGH (ref 150.0–400.0)
RBC: 3.5 Mil/uL — ABNORMAL LOW (ref 3.87–5.11)
RDW: 13.9 % (ref 11.5–15.5)
WBC: 11.8 10*3/uL — ABNORMAL HIGH (ref 4.0–10.5)

## 2024-01-26 LAB — MAGNESIUM: Magnesium: 1.4 mg/dL — ABNORMAL LOW (ref 1.5–2.5)

## 2024-01-26 MED ORDER — AMLODIPINE BESYLATE 2.5 MG PO TABS
2.5000 mg | ORAL_TABLET | Freq: Every day | ORAL | 3 refills | Status: AC
Start: 2024-01-26 — End: ?

## 2024-01-26 MED ORDER — MIRTAZAPINE 7.5 MG PO TABS: 7.5000 mg | ORAL_TABLET | Freq: Every day | ORAL | 3 refills | Status: AC

## 2024-01-26 MED ORDER — ALPRAZOLAM 0.5 MG PO TABS: 0.5000 mg | ORAL_TABLET | Freq: Every evening | ORAL | 1 refills | Status: DC | PRN

## 2024-01-26 MED ORDER — MIRABEGRON ER 50 MG PO TB24: 50.0000 mg | ORAL_TABLET | Freq: Every day | ORAL | 3 refills | Status: DC

## 2024-01-26 MED ORDER — CARVEDILOL 25 MG PO TABS: 25.0000 mg | ORAL_TABLET | Freq: Two times a day (BID) | ORAL | 3 refills | Status: DC

## 2024-01-26 NOTE — Progress Notes (Signed)
 Mostly stable, but Mg slightly low-make sure taking daily and possibly taking extra one now.  Repeat bmp, mg in 1-2 wks since sodium slightly low but she also skipped some lasix 

## 2024-01-26 NOTE — Patient Instructions (Signed)
 Miralax daily, prunes, etc.

## 2024-01-26 NOTE — Assessment & Plan Note (Signed)
 Controlled.  Continue carvedilol 25 mg twice daily and amlodipine 2.5mg  daily

## 2024-01-26 NOTE — Assessment & Plan Note (Signed)
 Chronic.  Controlled. Currently seeing nephrology.

## 2024-01-26 NOTE — Progress Notes (Signed)
 Subjective:    Patient ID: Jearldean Hodes, female    DOB: 01/08/1941, 83 y.o.   MRN: [id number]  Chief Complaint  Patient presents with   Medical Management of Chronic Issues    3 month follow-up Discharged from hospital last week for pneumonia  Pt is accompanied by her daughter.   HPI HTN - Pt is on Carvedilol  25 mg BID and Amlodipine  2.5 mg daily. Bp's running 130-140/60s . No ha/dizziness/cp/palp.    Low Sodium - SIADH.Aaron Aas No dizziness or confusion.  Saw nephrology-  Diagnosed with SIADH taking Lasix . Edema-chronic.  Furosemide  40 mg daily.  Doing well.  Managed by nephrology/cardiology.   Insomnia - Chronic. Xanax  0.5 mg and mirtazapine  7.5 mg daily hs. No SI.  States she needs both and not having any side effects and not wanting to change.  Has been doing this for years  HLD - Complaint with Lipitor 10 mg daily, responding well.  Lo Mg-taking OTC GERD-doing well on prevacid .  No new dysphagia.  H/o laryngeal ca Asthma-occ inhaler.  Clearing throat more. Epidural for chronic back pain and neck Osteoporosis-managed by endo Discussed the use of AI scribe software for clinical note transcription with the patient, who gave verbal consent to proceed.  History of Present Illness Jestine Thornsbury is an 83 year old female who presents for a three-month follow-up and recent hospitalization for bronchitis and viral infection. She is accompanied by her daughter.  She was hospitalized from April 27 to May 1 due to low oxygen levels and a viral infection, which was not COVID-19 but coronavirus. Initially, she visited urgent care on April 13 for mild bronchitis. During her hospital stay, she experienced electrolyte imbalances, including low sodium, magnesium , and potassium levels, which were treated promptly. She was also dehydrated and had low hemoglobin levels.  Since returning home, she has been experiencing constipation. She has tried various treatments including Miralax, Pericolase, prunes, and  magnesium  citrate-which worked, but has not had a bowel movement since Sunday night. She is considering using Miralax daily and eating prunes to manage this issue.  She has experienced a lack of appetite  during her illness. She has started to regain her appetite slowly. She was on prednisone  during her illness, which she has now completed, and she is no longer on antibiotics.  She continues to use Proventil  as needed for wheezing, particularly in the mornings. She takes Xanax  at night for sleep and reports that it is effective. She also takes carvedilol  25 mg twice a day and amlodipine  2.5 mg for blood pressure, which she notes runs a little low but is normal for her. She occasionally skips doses of Lasix  and magnesium  oxide due to her current issues with constipation.  She feels weak and unsteady at times, using her walker more frequently to prevent falls. No swelling is reported, and her sodium levels, although typically low, have been stable. She takes iron pills intermittently to manage her hemoglobin levels, which have been low.  She has a history of back and neck pain, which persists despite previous prednisone  treatment. She describes the neck pain as 'pinching' and notes that it flares up occasionally.  No thoughts of suicide, headache, dizziness, chest pain, or swelling are reported. She reports feeling weak and unsteady at times.    Health Maintenance Due  Topic Date Due   Medicare Annual Wellness (AWV)  02/03/2024     Past Medical History:  Diagnosis Date   Allergy    Arthritis    Asthma  esosinophillic   Cancer (HCC)    vocal cord   Cataract    GERD (gastroesophageal reflux disease)    Hyperlipidemia    Hypertension    Hyponatremia    Osteoporosis    Spinal stenosis     Past Surgical History:  Procedure Laterality Date   FEMUR FRACTURE SURGERY Right 08/2016   HERNIA REPAIR Right    inguinal   JOINT REPLACEMENT  2017   bilateral knee   LAMINECTOMY  11/2020    L3-5   LARYNGOSCOPY  11/2021   SPINAL FUSION  03/2021     Current Outpatient Medications:    albuterol  (PROVENTIL  HFA) 108 (90 Base) MCG/ACT inhaler, Inhale 1-2 puffs into the lungs every 6 (six) hours as needed for wheezing or shortness of breath., Disp: , Rfl:    albuterol  (PROVENTIL ) (2.5 MG/3ML) 0.083% nebulizer solution, Take 3 mLs (2.5 mg total) by nebulization every 6 (six) hours as needed for wheezing or shortness of breath., Disp: 75 mL, Rfl: 12   albuterol  (PROVENTIL ) (2.5 MG/3ML) 0.083% nebulizer solution, Take 3 mLs (2.5 mg total) by nebulization every 4 (four) hours as needed for wheezing or shortness of breath., Disp: 75 mL, Rfl: 0   atorvastatin  (LIPITOR) 10 MG tablet, Take 1 tablet (10 mg total) by mouth daily., Disp: 90 tablet, Rfl: 1   Coenzyme Q10 (COQ-10 PO), Take 1 capsule by mouth daily., Disp: , Rfl:    Cyanocobalamin  (VITAMIN B 12 PO), Take 1 tablet by mouth daily., Disp: , Rfl:    docusate sodium  (COLACE) 100 MG capsule, Take 1 capsule (100 mg total) by mouth 2 (two) times daily., Disp: 60 capsule, Rfl: 0   FASENRA  PEN 30 MG/ML prefilled autoinjector, INJECT 1 PEN UNDER THE SKIN EVERY 8 WEEKS, Disp: 1 mL, Rfl: 3   fluticasone -salmeterol (ADVAIR) 250-50 MCG/ACT AEPB, Inhale 1 puff into the lungs in the morning and at bedtime., Disp: 180 each, Rfl: 3   furosemide  (LASIX ) 40 MG tablet, Take 40 mg by mouth daily., Disp: , Rfl:    Magnesium  Oxide (MAG-OXIDE PO), Take 1 tablet by mouth daily., Disp: , Rfl:    predniSONE  (DELTASONE ) 10 MG tablet, Take 4 tablets (40 mg total) by mouth daily with breakfast for 2 days, THEN 2 tablets (20 mg total) daily with breakfast for 2 days, THEN 1 tablet (10 mg total) daily with breakfast for 2 days, THEN 0.5 tablets (5 mg total) daily with breakfast for 2 days., Disp: 15 tablet, Rfl: 0   PREVACID  30 MG capsule, Take 1 capsule (30 mg total) by mouth daily at 12 noon., Disp: 90 capsule, Rfl: 3   tiotropium (SPIRIVA) 18 MCG inhalation capsule,  Place 18 mcg into inhaler and inhale daily., Disp: , Rfl:    Vitamin D , Ergocalciferol , 50 MCG (2000 UT) CAPS, Take by mouth daily at 6 (six) AM., Disp: , Rfl:    ALPRAZolam  (XANAX ) 0.5 MG tablet, Take 1 tablet (0.5 mg total) by mouth at bedtime as needed for anxiety., Disp: 90 tablet, Rfl: 1   amLODipine  (NORVASC ) 2.5 MG tablet, Take 1 tablet (2.5 mg total) by mouth daily., Disp: 90 tablet, Rfl: 3   carvedilol  (COREG ) 25 MG tablet, Take 1 tablet (25 mg total) by mouth 2 (two) times daily with a meal., Disp: 180 tablet, Rfl: 3   mirabegron  ER (MYRBETRIQ ) 50 MG TB24 tablet, Take 1 tablet (50 mg total) by mouth daily., Disp: 90 tablet, Rfl: 3   mirtazapine  (REMERON ) 7.5 MG tablet, Take 1 tablet (7.5  mg total) by mouth at bedtime., Disp: 90 tablet, Rfl: 3  No Known Allergies ROS neg/noncontributory except as noted HPI/below      Objective:     BP 108/73   Pulse 69   Temp (!) 97.5 F (36.4 C) (Temporal)   Resp 18   Ht 4\' 8"  (1.422 m)   Wt 132 lb 4 oz (60 kg)   SpO2 95%   BMI 29.65 kg/m  Wt Readings from Last 3 Encounters:  01/26/24 132 lb 4 oz (60 kg)  01/19/24 135 lb 6.4 oz (61.4 kg)  11/30/23 139 lb 6.4 oz (63.2 kg)   Physical Exam   Gen: WDWN NAD HEENT: NCAT, conjunctiva not injected, sclera nonicteric NECK:  supple, no thyromegaly, no nodes, no carotid bruits  firm neck from surgery CARDIAC: RRR, S1S2+, no murmur. DP 2+B LUNGS: CTAB. No wheezes chronic.   ABDOMEN:  BS+, soft, NTND, No HSM, no masses EXT:  no edema MSK: walker.  NEURO: A&O x3.  CN II-XII intact.  PSYCH: normal mood. Good eye contact  Reviewed hosp records     Assessment & Plan:  Asthma, chronic obstructive, with acute exacerbation (HCC)  Chronic anemia -     CBC with Differential/Platelet  Essential hypertension Assessment & Plan: Controlled.  Continue carvedilol  25 mg twice daily and amlodipine  2.5mg  daily   Gastroesophageal reflux disease without esophagitis  Pure  hypercholesterolemia  Hypomagnesemia -     Magnesium   SIADH (syndrome of inappropriate ADH production) (HCC) Assessment & Plan: Chronic.  Controlled. Currently seeing nephrology.    Orders: -     Comprehensive metabolic panel with GFR  Community acquired pneumonia of right upper lobe of lung  Primary insomnia Assessment & Plan: Chronic.  Controlled.  Continue mirtazapine  7.5 mg and Xanax  0.5 mg nightly.  She is aware of risks, but has been doing this for so many years, does not want to make any changes.  PDMP checked   Other orders -     ALPRAZolam ; Take 1 tablet (0.5 mg total) by mouth at bedtime as needed for anxiety.  Dispense: 90 tablet; Refill: 1 -     amLODIPine  Besylate; Take 1 tablet (2.5 mg total) by mouth daily.  Dispense: 90 tablet; Refill: 3 -     Carvedilol ; Take 1 tablet (25 mg total) by mouth 2 (two) times daily with a meal.  Dispense: 180 tablet; Refill: 3 -     Mirabegron  ER; Take 1 tablet (50 mg total) by mouth daily.  Dispense: 90 tablet; Refill: 3 -     Mirtazapine ; Take 1 tablet (7.5 mg total) by mouth at bedtime.  Dispense: 90 tablet; Refill: 3  Assessment and Plan Assessment & Plan Dehydration   She was recently hospitalized due to dehydration, likely secondary to a viral infection and bronchitis, which resulted in electrolyte imbalances including hyponatremia, hypokalemia, and hypomagnesemia. Currently, there are no signs of dehydration. Monitor hydration status and encourage adequate fluid intake. Reconciled meds and updated list.   Viral infection   She was hospitalized recently due to a coronavirus infection (not COVID-19) with symptoms of low oxygen levels and electrolyte imbalances. She has recovered from the acute phase. Monitor for recurrence of symptoms.  Bronchitis   She experienced a recent episode of bronchitis prior to hospitalization, presenting with wheezing and low oxygen levels. Treatment included prednisone  and antibiotics. Currently, there  are no active symptoms. Use Proventil  as needed for wheezing and monitor respiratory status.  Hyponatremia   Hyponatremia occurred during hospitalization,  likely exacerbated by dehydration and illness. Sodium levels were critically low at 125 mmol/L but have improved. Monitor sodium levels with blood work and encourage dietary sodium intake as needed.  Hypokalemia   Hypokalemia was noted during hospitalization, likely due to dehydration and illness. Potassium levels have improved. Monitor potassium levels with blood work.  Hypomagnesemia   Hypomagnesemia was present during hospitalization, likely due to dehydration and illness. Magnesium  levels were critically low and required supplementation. Levels have improved. Monitor magnesium  levels with blood work and continue magnesium  oxide supplementation as needed.  Anemia   Anemia was noted during hospitalization with hemoglobin levels dropping. Levels have improved to a baseline of 10.9-11 g/dL. Iron supplementation was initiated post-hospitalization. Monitor hemoglobin levels with blood work and continue iron supplementation as needed.  Constipation   Chronic constipation has been exacerbated by recent illness and medication changes. She reports difficulty with bowel movements despite using Miralax, prunes, and magnesium  citrate. Bowel movements are irregular. Take Miralax daily, consume prunes daily, and monitor bowel movement regularity.      Return in about 3 months (around 04/27/2024) for chronic follow-up. Ellsworth Haas, MD

## 2024-01-26 NOTE — Assessment & Plan Note (Signed)
 Chronic.  Controlled.  Continue mirtazapine  7.5 mg and Xanax  0.5 mg nightly.  She is aware of risks, but has been doing this for so many years, does not want to make any changes.  PDMP checked

## 2024-01-27 ENCOUNTER — Other Ambulatory Visit: Payer: Self-pay | Admitting: *Deleted

## 2024-01-27 DIAGNOSIS — R790 Abnormal level of blood mineral: Secondary | ICD-10-CM

## 2024-01-27 DIAGNOSIS — R7989 Other specified abnormal findings of blood chemistry: Secondary | ICD-10-CM

## 2024-01-30 ENCOUNTER — Other Ambulatory Visit: Payer: Self-pay | Admitting: Internal Medicine

## 2024-01-30 MED ORDER — TIOTROPIUM BROMIDE MONOHYDRATE 18 MCG IN CAPS: 18.0000 ug | ORAL_CAPSULE | Freq: Every day | RESPIRATORY_TRACT | 3 refills | Status: DC

## 2024-01-30 MED ORDER — ALBUTEROL SULFATE HFA 108 (90 BASE) MCG/ACT IN AERS: 1.0000 | INHALATION_SPRAY | Freq: Four times a day (QID) | RESPIRATORY_TRACT | 1 refills | Status: DC | PRN

## 2024-01-30 NOTE — Telephone Encounter (Signed)
 Copied from CRM [id number]. Topic: Clinical - Medication Refill >> Jan 30, 2024 11:43 AM Margarette Shawl wrote: Medication:   albuterol  (PROVENTIL  HFA) 108 (90 Base) MCG/ACT inhaler  tiotropium (SPIRIVA) 18 MCG inhalation capsule  Has the patient contacted their pharmacy? Yes (Agent: If no, request that the patient contact the pharmacy for the refill. If patient does not wish to contact the pharmacy document the reason why and proceed with request.) (Agent: If yes, when and what did the pharmacy advise?)  This is the patient's preferred pharmacy:   [facility] - [place], [place] - [address] [facility] AT Portal to Registered [facility] Sites [address] [place] [address] Phone: [phone] Fax: [phone]  Is this the correct pharmacy for this prescription? Yes If no, delete pharmacy and type the correct one.   Has the prescription been filled recently? Yes  Is the patient out of the medication? No  Has the patient been seen for an appointment in the last year OR does the patient have an upcoming appointment? Yes  Can we respond through MyChart? Yes  Agent: Please be advised that Rx refills may take up to 3 business days. We ask that you follow-up with your pharmacy.

## 2024-01-30 NOTE — Addendum Note (Signed)
 Addended by: Jinx Mourning on: 01/30/2024 03:09 PM   Modules accepted: Orders

## 2024-01-30 NOTE — Telephone Encounter (Signed)
 Please advise on refills on meds filled historical meds

## 2024-01-30 NOTE — Telephone Encounter (Signed)
 Last Fill: Albuterol : Unk, Historical Provider     SpirivaAudelia Blazer, Historical Provider  Last OV: 08/11/23 Next OV: None Scheduled  Routing to provider for review/authorization.

## 2024-03-22 ENCOUNTER — Ambulatory Visit (INDEPENDENT_AMBULATORY_CARE_PROVIDER_SITE_OTHER)

## 2024-03-22 VITALS — Ht <= 58 in | Wt 132.0 lb

## 2024-03-22 DIAGNOSIS — Z0000 Encounter for general adult medical examination without abnormal findings: Secondary | ICD-10-CM

## 2024-03-22 DIAGNOSIS — R7989 Other specified abnormal findings of blood chemistry: Secondary | ICD-10-CM | POA: Insufficient documentation

## 2024-03-22 NOTE — Progress Notes (Signed)
 Subjective:   Julie Lutz is a 83 y.o. who presents for a Medicare Wellness preventive visit.  As a reminder, Annual Wellness Visits don't include a physical exam, and some assessments may be limited, especially if this visit is performed virtually. We may recommend an in-person follow-up visit with your provider if needed.  Visit Complete: Virtual I connected with  [name] on 03/22/24 by a audio enabled telemedicine application and verified that I am speaking with the correct person using two identifiers.  Patient Location: Home  Provider Location: Home Office  I discussed the limitations of evaluation and management by telemedicine. The patient expressed understanding and agreed to proceed.  Vital Signs: Because this visit was a virtual/telehealth visit, some criteria may be missing or patient reported. Any vitals not documented were not able to be obtained and vitals that have been documented are patient reported.  VideoDeclined- This patient declined Librarian, academic. Therefore the visit was completed with audio only.  Persons Participating in Visit: Patient.  AWV Questionnaire: No: Patient Medicare AWV questionnaire was not completed prior to this visit.  Cardiac Risk Factors include: advanced age (>57men, >54 women);hypertension     Objective:    Today's Vitals   03/22/24 1458  Weight: 132 lb (59.9 kg)  Height: 4' 8 (1.422 m)   Body mass index is 29.59 kg/m.     03/22/2024    3:04 PM 01/16/2024    7:41 AM 01/15/2024    1:51 PM 04/06/2023   12:34 PM 03/19/2023   11:01 AM 02/03/2023    8:37 AM 05/03/2018    9:44 PM  Advanced Directives  Does Patient Have a Medical Advance Directive? Yes Yes Yes No No Yes Yes   Type of Estate agent of [place];Living will Living will Living will   Healthcare Power of [place];Living will Healthcare Power of Attorney  Does patient want to make changes to medical advance  directive?  No - Patient declined No - Patient declined    No - Patient declined   Copy of Healthcare Power of Attorney in Chart? No - copy requested     No - copy requested No - copy requested   Would patient like information on creating a medical advance directive?     No - Patient declined       Data saved with a previous flowsheet row definition    Current Medications (verified) Outpatient Encounter Medications as of 03/22/2024  Medication Sig   albuterol  (PROVENTIL  HFA) 108 (90 Base) MCG/ACT inhaler Inhale 1-2 puffs into the lungs every 6 (six) hours as needed for wheezing or shortness of breath.   albuterol  (PROVENTIL ) (2.5 MG/3ML) 0.083% nebulizer solution Take 3 mLs (2.5 mg total) by nebulization every 6 (six) hours as needed for wheezing or shortness of breath.   albuterol  (PROVENTIL ) (2.5 MG/3ML) 0.083% nebulizer solution Take 3 mLs (2.5 mg total) by nebulization every 4 (four) hours as needed for wheezing or shortness of breath.   ALPRAZolam  (XANAX ) 0.5 MG tablet Take 1 tablet (0.5 mg total) by mouth at bedtime as needed for anxiety.   amLODipine  (NORVASC ) 2.5 MG tablet Take 1 tablet (2.5 mg total) by mouth daily.   atorvastatin  (LIPITOR) 10 MG tablet Take 1 tablet (10 mg total) by mouth daily.   carvedilol  (COREG ) 25 MG tablet Take 1 tablet (25 mg total) by mouth 2 (two) times daily with a meal.   Coenzyme Q10 (COQ-10 PO) Take 1 capsule by mouth daily.  Cyanocobalamin  (VITAMIN B 12 PO) Take 1 tablet by mouth daily.   FASENRA  PEN 30 MG/ML prefilled autoinjector INJECT 1 PEN UNDER THE SKIN EVERY 8 WEEKS   fluticasone -salmeterol (ADVAIR) 250-50 MCG/ACT AEPB Inhale 1 puff into the lungs in the morning and at bedtime.   furosemide  (LASIX ) 40 MG tablet Take 40 mg by mouth daily.   Magnesium  Oxide (MAG-OXIDE PO) Take 1 tablet by mouth daily.   mirabegron  ER (MYRBETRIQ ) 50 MG TB24 tablet Take 1 tablet (50 mg total) by mouth daily.   mirtazapine  (REMERON ) 7.5 MG tablet Take 1 tablet (7.5 mg  total) by mouth at bedtime.   PREVACID  30 MG capsule Take 1 capsule (30 mg total) by mouth daily at 12 noon.   tiotropium (SPIRIVA ) 18 MCG inhalation capsule Place 1 capsule (18 mcg total) into inhaler and inhale daily.   Vitamin D , Ergocalciferol , 50 MCG (2000 UT) CAPS Take by mouth daily at 6 (six) AM.   No facility-administered encounter medications on file as of 03/22/2024.    Allergies (verified) Patient has no known allergies.   History: Past Medical History:  Diagnosis Date   Allergy    Arthritis    Asthma    esosinophillic   Cancer (HCC)    vocal cord   Cataract    GERD (gastroesophageal reflux disease)    Hyperlipidemia    Hypertension    Hyponatremia    Osteoporosis    Spinal stenosis    Past Surgical History:  Procedure Laterality Date   FEMUR FRACTURE SURGERY Right 08/2016   HERNIA REPAIR Right    inguinal   JOINT REPLACEMENT  2017   bilateral knee   LAMINECTOMY  11/2020   L3-5   LARYNGOSCOPY  11/2021   SPINAL FUSION  03/2021   Family History  Problem Relation Age of Onset   Diabetes Sister    Cancer Sister 65       ovarian   Anemia Sister    Allergies Daughter    Asthma Neg Hx    Social History   Socioeconomic History   Marital status: Widowed    Spouse name: Not on file   Number of children: 2   Years of education: Not on file   Highest education level: Not on file  Occupational History   Occupation: RETIRED  Tobacco Use   Smoking status: Former    Current packs/day: 0.00    Types: Cigarettes    Quit date: 1990    Years since quitting: 35.5    Passive exposure: Never   Smokeless tobacco: Never  Vaping Use   Vaping status: Never Used  Substance and Sexual Activity   Alcohol use: Yes    Alcohol/week: 5.0 standard drinks of alcohol    Types: 5 Glasses of wine per week    Comment: occasional   Drug use: Never   Sexual activity: Not Currently  Other Topics Concern   Not on file  Social History Narrative   2 grands   Retired Charity fundraiser    Lives with her daughter/2025   Social Drivers of Corporate investment banker Strain: Low Risk  (03/22/2024)   Overall Financial Resource Strain (CARDIA)    Difficulty of Paying Living Expenses: Not hard at all  Food Insecurity: No Food Insecurity (03/22/2024)   Hunger Vital Sign    Worried About Running Out of Food in the Last Year: Never true    Ran Out of Food in the Last Year: Never true  Transportation Needs: No Transportation  Needs (03/22/2024)   PRAPARE - Administrator, Civil Service (Medical): No    Lack of Transportation (Non-Medical): No  Physical Activity: Inactive (03/22/2024)   Exercise Vital Sign    Days of Exercise per Week: 0 days    Minutes of Exercise per Session: 0 min  Stress: No Stress Concern Present (03/22/2024)   [employer] of Occupational Health - Occupational Stress Questionnaire    Feeling of Stress: Not at all  Social Connections: Socially Isolated (03/22/2024)   Social Connection and Isolation Panel    Frequency of Communication with Friends and Family: More than three times a week    Frequency of Social Gatherings with Friends and Family: Never    Attends Religious Services: Never    Database administrator or Organizations: No    Attends Banker Meetings: Never    Marital Status: Widowed    Tobacco Counseling Counseling given: Not Answered    Clinical Intake:     Pain : No/denies pain     BMI - recorded: 29.59 Nutritional Status: BMI 25 -29 Overweight Nutritional Risks: None Diabetes: No  Lab Results  Component Value Date   HGBA1C 5.9 (H) 01/17/2024     How often do you need to have someone help you when you read instructions, pamphlets, or other written materials from your doctor or pharmacy?: 1 - Never  Interpreter Needed?: No  Information entered by :: Rocquel Askren, RMA   Activities of Daily Living     03/22/2024    3:00 PM 01/16/2024    7:43 AM  In your present state of health, do you have any  difficulty performing the following activities:  Hearing? 0 0  Vision? 0 0  Difficulty concentrating or making decisions? 0 0  Walking or climbing stairs? 0   Dressing or bathing? 0   Doing errands, shopping? 0 1  Preparing Food and eating ? N   Using the Toilet? N   In the past six months, have you accidently leaked urine? N   Do you have problems with loss of bowel control? N   Managing your Medications? N   Managing your Finances? N   Housekeeping or managing your Housekeeping? N     Patient Care Team: Wendolyn Jenkins Jansky, MD as PCP - General (Family Medicine) Lonni Slain, MD as PCP - Cardiology (Cardiology)  I have updated your Care Teams any recent Medical Services you may have received from other providers in the past year.     Assessment:   This is a routine wellness examination for Uniqua.  Hearing/Vision screen Hearing Screening - Comments:: Denies hearing difficulties   Vision Screening - Comments:: Denies vision issues. Dr. Waylan    Goals Addressed             This Visit's Progress    Patient Stated   On track    Not at this time        Depression Screen     03/22/2024    3:07 PM 01/24/2024   12:22 PM 07/27/2023   11:16 AM 04/27/2023   11:33 AM 03/30/2023    1:08 PM 02/03/2023    8:36 AM 10/26/2022   10:40 AM  PHQ 2/9 Scores  PHQ - 2 Score 0 0 0 0 1 0 0  PHQ- 9 Score 0  0 0 5  0    Fall Risk     03/22/2024    3:04 PM 07/27/2023   11:16 AM 04/27/2023  11:33 AM 03/30/2023    1:08 PM 02/03/2023    8:38 AM  Fall Risk   Falls in the past year? 0 0 0 0 0  Number falls in past yr: 0 0 0 0 0  Injury with Fall? 0 0 0 0 0  Risk for fall due to :  No Fall Risks No Fall Risks No Fall Risks Impaired vision  Follow up Falls evaluation completed;Falls prevention discussed Falls evaluation completed Falls evaluation completed Falls evaluation completed Falls prevention discussed    MEDICARE RISK AT HOME:  Medicare Risk at Home Any stairs in or around the  home?: Yes (to get the house) If so, are there any without handrails?: No Home free of loose throw rugs in walkways, pet beds, electrical cords, etc?: Yes Adequate lighting in your home to reduce risk of falls?: Yes Life alert?: No Use of a cane, walker or w/c?: Yes Grab bars in the bathroom?: Yes Shower chair or bench in shower?: Yes Elevated toilet seat or a handicapped toilet?: Yes  TIMED UP AND GO:  Was the test performed?  No  Cognitive Function: Declined/Normal: No cognitive concerns noted by patient or family. Patient alert, oriented, able to answer questions appropriately and recall recent events. No signs of memory loss or confusion.        02/03/2023    8:39 AM  6CIT Screen  What Year? 0 points  What month? 0 points  What time? 0 points  Count back from 20 0 points  Months in reverse 0 points  Repeat phrase 0 points  Total Score 0 points    Immunizations Immunization History  Administered Date(s) Administered   Fluad Quad(high Dose 65+) 06/10/2022   Fluad Trivalent(High Dose 65+) 08/11/2023   Moderna Sars-Covid-2 Vaccination 10/17/2019, 11/14/2019   PNEUMOCOCCAL CONJUGATE-20 10/27/2023   Zoster Recombinant(Shingrix) 11/08/2023, 02/23/2024    Screening Tests Health Maintenance  Topic Date Due   COVID-19 Vaccine (3 - Moderna risk series) 12/12/2019   Medicare Annual Wellness (AWV)  02/03/2024   DTaP/Tdap/Td (1 - Tdap) 10/26/2024 (Originally 09/28/1959)   INFLUENZA VACCINE  04/20/2024   Pneumococcal Vaccine: 50+ Years  Completed   DEXA SCAN  Completed   Zoster Vaccines- Shingrix  Completed   Hepatitis B Vaccines  Aged Out   HPV VACCINES  Aged Out   Meningococcal B Vaccine  Aged Out    Health Maintenance  Health Maintenance Due  Topic Date Due   COVID-19 Vaccine (3 - Moderna risk series) 12/12/2019   Medicare Annual Wellness (AWV)  02/03/2024   Health Maintenance Items Addressed: See Nurse Notes at the end of this note  Additional  Screening:  Vision Screening: Recommended annual ophthalmology exams for early detection of glaucoma and other disorders of the eye. Would you like a referral to an eye doctor? No    Dental Screening: Recommended annual dental exams for proper oral hygiene  Community Resource Referral / Chronic Care Management: CRR required this visit?  No   CCM required this visit?  No   Plan:    I have personally reviewed and noted the following in the patient's chart:   Medical and social history Use of alcohol, tobacco or illicit drugs  Current medications and supplements including opioid prescriptions. Patient is not currently taking opioid prescriptions. Functional ability and status Nutritional status Physical activity Advanced directives List of other physicians Hospitalizations, surgeries, and ER visits in previous 12 months Vitals Screenings to include cognitive, depression, and falls Referrals and appointments  In  addition, I have reviewed and discussed with patient certain preventive protocols, quality metrics, and best practice recommendations. A written personalized care plan for preventive services as well as general preventive health recommendations were provided to patient.   Richerd Grime L Lareen Mullings, CMA   03/22/2024   After Visit Summary: (MyChart) Due to this being a telephonic visit, the after visit summary with patients personalized plan was offered to patient via MyChart   Notes: Patient is due for a Tdap.  She will be due for a DEXA this fall.  Patient stated that rheumatology places the orders for her.  She had no other concerns to address today.

## 2024-03-22 NOTE — Patient Instructions (Signed)
 Julie Lutz , Thank you for taking time out of your busy schedule to complete your Annual Wellness Visit with me. I enjoyed our conversation and look forward to speaking with you again next year. I, as well as your care team,  appreciate your ongoing commitment to your health goals. Please review the following plan we discussed and let me know if I can assist you in the future. Your Game plan/ To Do List   Follow up Visits: Next Medicare AWV with our clinical staff: 03/27/2025.   Have you seen your provider in the last 6 months (3 months if uncontrolled diabetes)? Yes Next Office Visit with your provider: 05/02/2024.  Clinician Recommendations:  Aim for 30 minutes of exercise or brisk walking, 6-8 glasses of water, and 5 servings of fruits and vegetables each day. You are due for tetanus vaccine and can get it done at your local pharmacy.        This is a list of the screening recommended for you and due dates:  Health Maintenance  Topic Date Due   COVID-19 Vaccine (3 - Moderna risk series) 12/12/2019   Medicare Annual Wellness Visit  02/03/2024   Zoster (Shingles) Vaccine (2 of 2) 05/01/2024*   DTaP/Tdap/Td vaccine (1 - Tdap) 10/26/2024*   Flu Shot  04/20/2024   Pneumococcal Vaccine for age over 83  Completed   DEXA scan (bone density measurement)  Completed   Hepatitis B Vaccine  Aged Out   HPV Vaccine  Aged Out   Meningitis B Vaccine  Aged Out  *Topic was postponed. The date shown is not the original due date.    Advanced directives: (Copy Requested) Please bring a copy of your health care power of attorney and living will to the office to be added to your chart at your convenience. You can mail to [facility] [address] [place], [place] [address] or email to [email or web address] Advance Care Planning is important because it:  [x]  Makes sure you receive the medical care that is consistent with your values, goals, and preferences  [x]  It provides guidance to  your family and loved ones and reduces their decisional burden about whether or not they are making the right decisions based on your wishes.  Follow the link provided in your after visit summary or read over the paperwork we have mailed to you to help you started getting your Advance Directives in place. If you need assistance in completing these, please reach out to us  so that we can help you!  See attachments for Preventive Care and Fall Prevention Tips.

## 2024-04-06 ENCOUNTER — Telehealth: Payer: Self-pay

## 2024-04-06 NOTE — Telephone Encounter (Signed)
  Patient Consent for Virtual Visit        Julie Lutz has provided verbal consent on 04/06/2024 for a virtual visit (video or telephone).   CONSENT FOR VIRTUAL VISIT FOR:  Julie Lutz  By participating in this virtual visit I agree to the following:  I hereby voluntarily request, consent and authorize [facility] and its employed or contracted physicians, physician assistants, nurse practitioners or other licensed health care professionals (the Practitioner), to provide me with telemedicine health care services (the "Services) as deemed necessary by the treating Practitioner. I acknowledge and consent to receive the Services by the Practitioner via telemedicine. I understand that the telemedicine visit will involve communicating with the Practitioner through live audiovisual communication technology and the disclosure of certain medical information by electronic transmission. I acknowledge that I have been given the opportunity to request an in-person assessment or other available alternative prior to the telemedicine visit and am voluntarily participating in the telemedicine visit.  I understand that I have the right to withhold or withdraw my consent to the use of telemedicine in the course of my care at any time, without affecting my right to future care or treatment, and that the Practitioner or I may terminate the telemedicine visit at any time. I understand that I have the right to inspect all information obtained and/or recorded in the course of the telemedicine visit and may receive copies of available information for a reasonable fee.  I understand that some of the potential risks of receiving the Services via telemedicine include:  Delay or interruption in medical evaluation due to technological equipment failure or disruption; Information transmitted may not be sufficient (e.g. poor resolution of images) to allow for appropriate medical decision making by the Practitioner;  and/or  In rare instances, security protocols could fail, causing a breach of personal health information.  Furthermore, I acknowledge that it is my responsibility to provide information about my medical history, conditions and care that is complete and accurate to the best of my ability. I acknowledge that Practitioner's advice, recommendations, and/or decision may be based on factors not within their control, such as incomplete or inaccurate data provided by me or distortions of diagnostic images or specimens that may result from electronic transmissions. I understand that the practice of medicine is not an exact science and that Practitioner makes no warranties or guarantees regarding treatment outcomes. I acknowledge that a copy of this consent can be made available to me via my patient portal [facility] MyChart), or I can request a printed copy by calling the office of  [facility].    I understand that my insurance will be billed for this visit.   I have read or had this consent read to me. I understand the contents of this consent, which adequately explains the benefits and risks of the Services being provided via telemedicine.  I have been provided ample opportunity to ask questions regarding this consent and the Services and have had my questions answered to my satisfaction. I give my informed consent for the services to be provided through the use of telemedicine in my medical care

## 2024-04-06 NOTE — Telephone Encounter (Signed)
   Name: Julie Lutz  DOB: May 09, 1941  MRN: [id number]  Primary Cardiologist: Shelda Bruckner, MD   Preoperative team, please contact this patient and set up a phone call appointment for further preoperative risk assessment. Please obtain consent and complete medication review. Thank you for your help.  I confirm that guidance regarding antiplatelet and oral anticoagulation therapy has been completed and, if necessary, noted below.  I also confirmed the patient resides in the state of [place] . As per [facility] Medical Board telemedicine laws, the patient must reside in the state in which the provider is licensed.   Artist Pouch, PA-C 04/06/2024, 4:25 PM [facility]

## 2024-04-06 NOTE — Telephone Encounter (Signed)
   Pre-operative Risk Assessment    Patient Name: [name]  DOB: [birth year] MRN: [id number]   Date of last office visit: 07/12/23 SHELDA BRUCKNER, MD Date of next office visit: NONE   Request for Surgical Clearance    Procedure:  BILATERAL UPPER EYELID BLEPHAROPLASTY [BULB] WITH NASAL FAT AND OUTER BROW SUTURE (BRASSIERE SUTURE)  Date of Surgery:  Clearance 04/18/24                                Surgeon:  REFUGIO NANNY, MD Surgeon's Group or Practice Name:  [facility] Phone number:  [phone] Fax number:  [phone]   Type of Clearance Requested:   - Medical    Type of Anesthesia:  MAC   Additional requests/questions:    Signed, Lucie DELENA Ku   04/06/2024, 4:19 PM

## 2024-04-06 NOTE — Telephone Encounter (Signed)
 Preop clearance scheduled, med rec and consent done

## 2024-04-12 ENCOUNTER — Ambulatory Visit: Attending: Cardiology | Admitting: Emergency Medicine

## 2024-04-12 DIAGNOSIS — Z01810 Encounter for preprocedural cardiovascular examination: Secondary | ICD-10-CM | POA: Diagnosis present

## 2024-04-12 NOTE — Progress Notes (Signed)
 Virtual Visit via Telephone Note   Because of Julie Lutz co-morbid illnesses, she is at least at moderate risk for complications without adequate follow up.  This format is felt to be most appropriate for this patient at this time.  Due to technical limitations with video connection (technology), today's appointment will be conducted as an audio only telehealth visit, and Julie Lutz verbally agreed to proceed in this manner.   All issues noted in this document were discussed and addressed.  No physical exam could be performed with this format.  Evaluation Performed:  Preoperative cardiovascular risk assessment _____________   Date:  04/12/2024   Patient ID:  Julie Lutz, DOB 03/24/1941, MRN [id number] Patient Location:  Home Provider location:   Office  Primary Care Provider:  Wendolyn Jenkins Jansky, MD Primary Cardiologist:  Shelda Bruckner, MD  Chief Complaint / Patient Profile   83 y.o. y/o female with a h/o hypertension, hyperlipidemia, asthma, anemia, vocal cord cancer who is pending bilateral upper eyelid blepharoplasty with nasal fat and upper brow suture on date 04/18/2024 with [facility] and presents today for telephonic preoperative cardiovascular risk assessment.  History of Present Illness    Julie Lutz is a 83 y.o. female who presents via audio/video conferencing for a telehealth visit today.  Pt was last seen in cardiology clinic on 07/12/2023 by Dr. Bruckner.  At that time Julie Lutz was doing well.  The patient is now pending procedure as outlined above. Since her last visit, she denies chest pain, shortness of breath, lower extremity edema, fatigue, palpitations, melena, hematuria, hemoptysis, diaphoresis, weakness, presyncope, syncope, orthopnea, and PND.  Today patient is doing well.  She is without acute cardiovascular concerns or complaints at this time she denies any chest pains, dyspnea, or exertional symptoms.  Overall she stays somewhat active  which is limited by her chronic pain.  She does walk with a walker and cane.  However she does walk up stairs without exertional symptoms and is able to do moderate work around the house without limitation.  Overall she is able to complete greater than 4 METS.  Past Medical History    Past Medical History:  Diagnosis Date   Allergy    Arthritis    Asthma    esosinophillic   Cancer (HCC)    vocal cord   Cataract    GERD (gastroesophageal reflux disease)    Hyperlipidemia    Hypertension    Hyponatremia    Osteoporosis    Spinal stenosis    Past Surgical History:  Procedure Laterality Date   FEMUR FRACTURE SURGERY Right 08/2016   HERNIA REPAIR Right    inguinal   JOINT REPLACEMENT  2017   bilateral knee   LAMINECTOMY  11/2020   L3-5   LARYNGOSCOPY  11/2021   SPINAL FUSION  03/2021    Allergies  No Known Allergies  Home Medications    Prior to Admission medications   Medication Sig Start Date End Date Taking? Authorizing Provider  albuterol  (PROVENTIL  HFA) 108 (90 Base) MCG/ACT inhaler Inhale 1-2 puffs into the lungs every 6 (six) hours as needed for wheezing or shortness of breath. 01/30/24   Wendolyn Jenkins Jansky, MD  albuterol  (PROVENTIL ) (2.5 MG/3ML) 0.083% nebulizer solution Take 3 mLs (2.5 mg total) by nebulization every 6 (six) hours as needed for wheezing or shortness of breath. 07/30/22   Meade Verdon RAMAN, MD  albuterol  (PROVENTIL ) (2.5 MG/3ML) 0.083% nebulizer solution Take 3 mLs (2.5 mg total) by nebulization every 4 (four)  hours as needed for wheezing or shortness of breath. 01/19/24   Cindy Garnette POUR, MD  ALPRAZolam  (XANAX ) 0.5 MG tablet Take 1 tablet (0.5 mg total) by mouth at bedtime as needed for anxiety. 01/26/24   Wendolyn Jenkins Jansky, MD  amLODipine  (NORVASC ) 2.5 MG tablet Take 1 tablet (2.5 mg total) by mouth daily. 01/26/24   Wendolyn Jenkins Jansky, MD  atorvastatin  (LIPITOR) 10 MG tablet Take 1 tablet (10 mg total) by mouth daily. 01/20/24   Lonni Slain, MD   carvedilol  (COREG ) 25 MG tablet Take 1 tablet (25 mg total) by mouth 2 (two) times daily with a meal. 01/26/24   Wendolyn Jenkins Jansky, MD  Coenzyme Q10 (COQ-10 PO) Take 1 capsule by mouth daily.    [provider]  Cyanocobalamin  (VITAMIN B 12 PO) Take 1 tablet by mouth daily.    [provider]  FASENRA  PEN 30 MG/ML prefilled autoinjector INJECT 1 PEN UNDER THE SKIN EVERY 8 WEEKS 10/25/23   Meade Verdon RAMAN, MD  fluticasone -salmeterol (ADVAIR) 250-50 MCG/ACT AEPB Inhale 1 puff into the lungs in the morning and at bedtime. 01/12/24   Desai, Nikita S, MD  furosemide  (LASIX ) 40 MG tablet Take 40 mg by mouth daily.    [provider]  Magnesium  Oxide (MAG-OXIDE PO) Take 1 tablet by mouth daily.    [provider]  mirabegron  ER (MYRBETRIQ ) 50 MG TB24 tablet Take 1 tablet (50 mg total) by mouth daily. 01/26/24   Wendolyn Jenkins Jansky, MD  mirtazapine  (REMERON ) 7.5 MG tablet Take 1 tablet (7.5 mg total) by mouth at bedtime. 01/26/24   Wendolyn Jenkins Jansky, MD  PREVACID  30 MG capsule Take 1 capsule (30 mg total) by mouth daily at 12 noon. 07/27/23   Wendolyn Jenkins Jansky, MD  tiotropium (SPIRIVA ) 18 MCG inhalation capsule Place 1 capsule (18 mcg total) into inhaler and inhale daily. 01/30/24   Wendolyn Jenkins Jansky, MD  Vitamin D , Ergocalciferol , 50 MCG (2000 UT) CAPS Take by mouth daily at 6 (six) AM.    [provider]    Physical Exam    Vital Signs:  Julie Lutz does not have vital signs available for review today.  Given telephonic nature of communication, physical exam is limited. AAOx3. NAD. Normal affect.  Speech and respirations are unlabored.  Accessory Clinical Findings    None  Assessment & Plan    1.  Preoperative Cardiovascular Risk Assessment: According to the Revised Cardiac Risk Index (RCRI), her Perioperative Risk of Major Cardiac Event is (%): 0.4. Her Functional Capacity in METs is: 5.07 according to the Duke Activity Status Index (DASI). Therefore, based on  ACC/AHA guidelines, patient would be at acceptable risk for the planned procedure without further cardiovascular testing.   The patient was advised that if she develops new symptoms prior to surgery to contact our office to arrange for a follow-up visit, and she verbalized understanding.  A copy of this note will be routed to requesting surgeon.  Time:   Today, I have spent 10 minutes with the patient with telehealth technology discussing medical history, symptoms, and management plan.     Lum LITTIE Louis, NP  04/12/2024, 9:41 AM

## 2024-04-18 HISTORY — PX: BELPHAROPTOSIS REPAIR: SHX369

## 2024-04-25 NOTE — Progress Notes (Signed)
 Office Visit Note  Patient: Julie Lutz             Date of Birth: [birth year]           MRN: [id number]             PCP: Julie Jenkins Jansky, MD Referring: Julie Jenkins Jansky, MD Visit Date: 05/03/2024 Occupation: @GUAROCC @  Subjective:  Lower back pain  History of Present Illness: Julie Lutz is a 83 y.o. female with osteoarthritis and osteoporosis.  She returns today after her last visit in February 2025.  She had last Reclast  infusion on November 30, 2023.  She tolerated the infusion well.  She has been taking vitamin D  2000 units daily and not taking calcium  supplement.  She has been experiencing lower back pain which is localized to her back.  She denies any radiculopathy.  She had no relief from epidural injection in the past.  She has occasional discomfort in her lower back.  None of the other joints are painful.    Activities of Daily Living:  Patient reports morning stiffness for a few minutes.   Patient Denies nocturnal pain.  Difficulty dressing/grooming: Denies Difficulty climbing stairs: Reports Difficulty getting out of chair: Reports Difficulty using hands for taps, buttons, cutlery, and/or writing: Denies  Review of Systems  Constitutional:  Negative for fatigue.  HENT:  Negative for mouth sores and mouth dryness.   Eyes:  Positive for dryness.  Respiratory:  Positive for shortness of breath.   Cardiovascular:  Negative for chest pain and palpitations.  Gastrointestinal:  Negative for blood in stool, constipation and diarrhea.  Endocrine: Negative for increased urination.  Genitourinary:  Negative for involuntary urination.  Musculoskeletal:  Positive for joint pain, gait problem, joint pain and morning stiffness. Negative for joint swelling, myalgias, muscle weakness, muscle tenderness and myalgias.  Skin:  Negative for color change, rash, hair loss and sensitivity to sunlight.  Allergic/Immunologic: Negative for susceptible to infections.  Neurological:  Negative  for dizziness and headaches.  Hematological:  Negative for swollen glands.  Psychiatric/Behavioral:  Negative for depressed mood and sleep disturbance. The patient is not nervous/anxious.     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 04/27/2023   Hypomagnesemia 04/27/2023   Pure hypercholesterolemia 11/23/2022   Age-related osteoporosis without current pathological fracture 11/03/2022   Chronic anemia 10/26/2022   Essential hypertension 05/25/2022   Gastroesophageal reflux disease without esophagitis 05/25/2022   Localized osteoporosis without current pathological fracture 05/25/2022   Asthma, chronic 05/25/2022   History of vocal cord cancer (HCC) 05/25/2022   OAB (overactive bladder) 05/25/2022   Primary insomnia 05/25/2022   Lumbar back pain 02/11/2021   Lumbar facet arthropathy 10/07/2020   Lumbar degenerative disc disease 09/22/2020   Lumbar radiculitis 09/22/2020   Scoliosis of thoracolumbar spine 09/22/2020   Athscl heart disease of native coronary artery w/o ang pctrs 08/24/2020   Depression, unspecified 08/24/2020   History of falling 08/24/2020   Pain in left thigh 08/24/2020   Unspecified protein-calorie malnutrition (HCC) 08/24/2020   Bilateral leg edema 06/04/2020   Hyperkalemia 11/01/2019  Stage 3a chronic kidney disease (HCC) 11/01/2019   Personal history of nicotine dependence 09/21/2019   Acute renal failure (HCC) 10/04/2018   Complex renal cyst 04/19/2018    Past Medical History:  Diagnosis Date    Allergy    Arthritis    Asthma    esosinophillic   Cancer (HCC)    vocal cord   Cataract    GERD (gastroesophageal reflux disease)    Hyperlipidemia    Hypertension    Hyponatremia    Osteoporosis    Spinal stenosis     Family History  Problem Relation Age of Onset   Diabetes Sister    Cancer Sister 32       ovarian   Anemia Sister    Allergies Daughter    Asthma Neg Hx    Past Surgical History:  Procedure Laterality Date   BELPHAROPTOSIS REPAIR Bilateral 04/18/2024   FEMUR FRACTURE SURGERY Right 08/2016   HERNIA REPAIR Right    inguinal   JOINT REPLACEMENT  2017   bilateral knee   LAMINECTOMY  11/2020   L3-5   LARYNGOSCOPY  11/2021   SPINAL FUSION  03/2021   Social History   Social History Narrative   2 grands   Retired [employer] fundraiser   Lives with her daughter/2025   Immunization History  Administered Date(s) Administered   Fluad Quad(high Dose 65+) 06/10/2022   Fluad Trivalent(High Dose 65+) 08/11/2023   Moderna Sars-Covid-2 Vaccination 10/17/2019, 11/14/2019   PNEUMOCOCCAL CONJUGATE-20 10/27/2023   Zoster Recombinant(Shingrix) 11/08/2023, 02/23/2024     Objective: Vital Signs: BP 138/79 (BP Location: Right Arm, Patient Position: Sitting, Cuff Size: Normal)   Pulse 69   Resp 14   Ht 4' 8 (1.422 m)   Wt 134 lb 12.8 oz (61.1 kg)   BMI 30.22 kg/m    Physical Exam Vitals and nursing note reviewed.  Constitutional:      Appearance: She is well-developed.  HENT:     Head: Normocephalic and atraumatic.  Eyes:     Conjunctiva/sclera: Conjunctivae normal.  Cardiovascular:     Rate and Rhythm: Normal rate and regular rhythm.     Heart sounds: Normal heart sounds.  Pulmonary:     Effort: Pulmonary effort is normal.     Breath sounds: Normal breath sounds.  Abdominal:     General: Bowel sounds are normal.     Palpations: Abdomen is soft.  Musculoskeletal:     Cervical back: Normal range of motion.  Lymphadenopathy:     Cervical: No cervical adenopathy.   Skin:    General: Skin is warm and dry.     Capillary Refill: Capillary refill takes less than 2 seconds.  Neurological:     Mental Status: She is alert and oriented to person, place, and time.  Psychiatric:        Behavior: Behavior normal.      Musculoskeletal Exam: She had limited range of motion of the cervical spine.  She was examined in the seated position.  Thoracic osmosis was noted.  She had marked thoracolumbar scoliosis.  There was no point tenderness over the lumbar spine.  Shoulder joints and elbow joints in good range of motion.  She had bilateral CMC, PIP and DIP thickening without any synovitis.  Hip joints could not be assessed in the seated position.  Knee joints were replaced.  There was no tenderness over her ankles or MTPs.  CDAI Exam: CDAI Score: -- Patient Global: --; Provider Global: -- Swollen: --; Tender: -- Joint  Exam 05/03/2024   No joint exam has been documented for this visit   There is currently no information documented on the homunculus. Go to the Rheumatology activity and complete the homunculus joint exam.  Investigation: No additional findings.  Imaging: No results found.  Recent Labs: Lab Results  Component Value Date   WBC 11.8 (H) 01/26/2024   HGB 10.3 (L) 01/26/2024   PLT 433.0 (H) 01/26/2024   NA 127 (L) 01/26/2024   K 3.7 01/26/2024   CL 91 (L) 01/26/2024   CO2 29 01/26/2024   GLUCOSE 90 01/26/2024   BUN 23 01/26/2024   CREATININE 1.09 01/26/2024   BILITOT 0.6 01/26/2024   ALKPHOS 64 01/26/2024   AST 11 01/26/2024   ALT 14 01/26/2024   PROT 6.1 01/26/2024   ALBUMIN 3.6 01/26/2024   CALCIUM  9.0 01/26/2024   GFRAA >60 05/06/2018    Speciality Comments: Reclast  infusion 2016, 2017, 2021, January 2023, February 2024, March 2025  Procedures:  No procedures performed Allergies: Patient has no known allergies.   Assessment / Plan:     Visit Diagnoses: Age-related osteoporosis without current pathological fracture - DEXA  06/08/22: Right 1/3 distal radius BMD 0.536 with T-score -2.6.  She had Reclast  infusion in January 2023, February 2024 and the last one in March 2025.  She will go on a drug holiday now.  Will get DEXA scan this year.  Need for regular exercise was emphasized.  Use of calcium  rich diet and vitamin D  was discussed.  Patient has been taking vitamin D  on a daily basis.  Medication monitoring encounter - Reclast  IV on 11/30/2023.  Height loss - History of height loss of at least 4 inches. X-rays of the lumbar spine on 11/11/2022: Age-indeterminate compression deformity of L1 with up to 15% height loss  Other kyphosis of thoracic region - Thoracic kyphosis noted without any tenderness.  Primary osteoarthritis of both hands-joint protection was discussed.  Status post total knee replacement, bilateral-doing well.  Spinal stenosis of lumbar region with neurogenic claudication - Lumbar laminectomy March 2022, May 2022 and fusion July 2022 in [place].  She continues to have lower back discomfort.  She had no point tenderness on the examination.  Patient states she had no benefit from surgery or injections in the past.  Other medical problems are listed as follows:  Primary hypertension-blood pressure was normal today.  Gastroesophageal reflux disease without esophagitis  Eosinophilic asthma  Vocal cord cancer (HCC)  Primary insomnia  OAB (overactive bladder)  History of sepsis  Former smoker  Orders: Orders Placed This Encounter  Procedures   DG Bone Density   No orders of the defined types were placed in this encounter.    Follow-Up Instructions: Return in about 6 months (around 11/03/2024) for Osteoarthritis, Osteoporosis.   Maya Nash, MD  Note - This record has been created using Animal nutritionist.  Chart creation errors have been sought, but may not always  have been located. Such creation errors do not reflect on  the standard of medical care.

## 2024-04-30 ENCOUNTER — Other Ambulatory Visit: Payer: Self-pay | Admitting: Internal Medicine

## 2024-04-30 DIAGNOSIS — J4550 Severe persistent asthma, uncomplicated: Secondary | ICD-10-CM

## 2024-04-30 MED ORDER — FASENRA PEN 30 MG/ML ~~LOC~~ SOAJ: 30.0000 mg | SUBCUTANEOUS | 1 refills | Status: DC

## 2024-04-30 NOTE — Telephone Encounter (Signed)
 Refill sent for FASENRA  to [facility]: [phone]  Dose: 30mg   every 8 weeks  Last OV: 08/11/23 Provider: Dr. Meade   Next OV: 08/22/24  Aleck Puls, PharmD, BCPS Clinical Pharmacist  [facility]

## 2024-04-30 NOTE — Telephone Encounter (Signed)
 Copied from CRM [id number]. Topic: Clinical - Medication Refill >> Apr 30, 2024 11:48 AM Benton O wrote: Medication: FASENRA  PEN 30 MG/ML prefilled autoinjector  Has the patient contacted their pharmacy? Yes said to contact doctor  (Agent: If no, request that the patient contact the pharmacy for the refill. If patient does not wish to contact the pharmacy document the reason why and proceed with request.) (Agent: If yes, when and what did the pharmacy advise?)  This is the patient's preferred pharmacy:  [facility] - [facility], [place] - [address] [place] [address] Phone: [phone] Fax: [phone]   [id number] [address], [place] [address] Phone: [phone] Fax: [phone]  [facility] - [place], [place] - [address] [place] [address] Phone: [phone] Fax: [phone]  [facility] [address] [place] [address] Phone: [phone] Fax: [phone]  Is this the correct pharmacy for this prescription? Yes If no, delete pharmacy and type the correct one.  [facility] - [facility], [place] - [address] [place] [address] Phone: [phone] Fax: [phone]   Has the prescription been filled recently? No  Is the patient out of the medication? Yes  Has the patient been seen for an appointment in the last year OR does the patient have an upcoming appointment? Yes  Can we respond through MyChart? No  Agent: Please be advised that Rx refills may take up to 3 business days. We ask that you follow-up with your pharmacy.

## 2024-05-02 ENCOUNTER — Ambulatory Visit (INDEPENDENT_AMBULATORY_CARE_PROVIDER_SITE_OTHER): Admitting: Family Medicine

## 2024-05-02 ENCOUNTER — Encounter: Payer: Self-pay | Admitting: Family Medicine

## 2024-05-02 VITALS — BP 125/75 | HR 76 | Temp 97.6°F | Resp 18 | Ht <= 58 in | Wt 134.4 lb

## 2024-05-02 DIAGNOSIS — D649 Anemia, unspecified: Secondary | ICD-10-CM

## 2024-05-02 DIAGNOSIS — R7303 Prediabetes: Secondary | ICD-10-CM | POA: Insufficient documentation

## 2024-05-02 DIAGNOSIS — J4540 Moderate persistent asthma, uncomplicated: Secondary | ICD-10-CM | POA: Diagnosis not present

## 2024-05-02 DIAGNOSIS — C320 Malignant neoplasm of glottis: Secondary | ICD-10-CM

## 2024-05-02 DIAGNOSIS — R7989 Other specified abnormal findings of blood chemistry: Secondary | ICD-10-CM

## 2024-05-02 DIAGNOSIS — R600 Localized edema: Secondary | ICD-10-CM

## 2024-05-02 DIAGNOSIS — I2510 Atherosclerotic heart disease of native coronary artery without angina pectoris: Secondary | ICD-10-CM | POA: Diagnosis not present

## 2024-05-02 DIAGNOSIS — I10 Essential (primary) hypertension: Secondary | ICD-10-CM

## 2024-05-02 DIAGNOSIS — I6523 Occlusion and stenosis of bilateral carotid arteries: Secondary | ICD-10-CM

## 2024-05-02 DIAGNOSIS — E222 Syndrome of inappropriate secretion of antidiuretic hormone: Secondary | ICD-10-CM | POA: Diagnosis not present

## 2024-05-02 DIAGNOSIS — K219 Gastro-esophageal reflux disease without esophagitis: Secondary | ICD-10-CM

## 2024-05-02 LAB — COMPREHENSIVE METABOLIC PANEL WITH GFR
ALT: 8 U/L (ref 0–35)
AST: 13 U/L (ref 0–37)
Albumin: 4 g/dL (ref 3.5–5.2)
Alkaline Phosphatase: 78 U/L (ref 39–117)
BUN: 14 mg/dL (ref 6–23)
CO2: 31 meq/L (ref 19–32)
Calcium: 9.1 mg/dL (ref 8.4–10.5)
Chloride: 93 meq/L — ABNORMAL LOW (ref 96–112)
Creatinine, Ser: 0.82 mg/dL (ref 0.40–1.20)
GFR: 65.15 mL/min (ref >60.00)
Glucose, Bld: 105 mg/dL — ABNORMAL HIGH (ref 70–99)
Potassium: 4.1 meq/L (ref 3.5–5.1)
Sodium: 131 meq/L — ABNORMAL LOW (ref 135–145)
Total Bilirubin: 0.4 mg/dL (ref 0.2–1.2)
Total Protein: 7.1 g/dL (ref 6.0–8.3)

## 2024-05-02 LAB — CBC WITH DIFFERENTIAL/PLATELET
Basophils Absolute: 0 K/uL (ref 0.0–0.1)
Basophils Relative: 0.1 % (ref 0.0–3.0)
Eosinophils Absolute: 0 K/uL (ref 0.0–0.7)
Eosinophils Relative: 0 % (ref 0.0–5.0)
HCT: 34.2 % — ABNORMAL LOW (ref 36.0–46.0)
Hemoglobin: 11.1 g/dL — ABNORMAL LOW (ref 12.0–15.0)
Lymphocytes Relative: 17.7 % (ref 12.0–46.0)
Lymphs Abs: 1.3 K/uL (ref 0.7–4.0)
MCHC: 32.5 g/dL (ref 30.0–36.0)
MCV: 90.7 fl (ref 78.0–100.0)
Monocytes Absolute: 0.6 K/uL (ref 0.1–1.0)
Monocytes Relative: 8.8 % (ref 3.0–12.0)
Neutro Abs: 5.4 K/uL (ref 1.4–7.7)
Neutrophils Relative %: 73.4 % (ref 43.0–77.0)
Platelets: 353 K/uL (ref 150.0–400.0)
RBC: 3.77 Mil/uL — ABNORMAL LOW (ref 3.87–5.11)
RDW: 14.3 % (ref 11.5–15.5)
WBC: 7.3 K/uL (ref 4.0–10.5)

## 2024-05-02 LAB — HEMOGLOBIN A1C: Hgb A1c MFr Bld: 5.5 % (ref 4.6–6.5)

## 2024-05-02 MED ORDER — ALBUTEROL SULFATE HFA 108 (90 BASE) MCG/ACT IN AERS: 1.0000 | INHALATION_SPRAY | Freq: Four times a day (QID) | RESPIRATORY_TRACT | 1 refills | Status: AC | PRN

## 2024-05-02 MED ORDER — FUROSEMIDE 40 MG PO TABS: 40.0000 mg | ORAL_TABLET | Freq: Every day | ORAL | 1 refills | Status: AC

## 2024-05-02 NOTE — Patient Instructions (Signed)

## 2024-05-02 NOTE — Progress Notes (Signed)
 Subjective:     Patient ID: Julie Lutz, female    DOB: [birth year], 83 y.o.   MRN: [id number]  Chief Complaint  Patient presents with   Medical Management of Chronic Issues    3 month follow-up     HPI Htn,edema, hyponat,hld,insom, lo mg,gerd,asthma,predm Discussed the use of AI scribe software for clinical note transcription with the patient, who gave verbal consent to proceed.  History of Present Illness Julie Lutz is an 83 year old female who presents for a routine follow-up. She is accompanied by her daughter.  She underwent bilateral blepharoplasty two weeks ago and experiences postoperative swelling and puffiness around the eyes. The stitches are scheduled to be removed tomorrow.  Hypertension is managed with amlodipine  2.5 mg and carvedilol  25 mg twice daily. Blood pressure readings are stable, typically in the 120s, with no dizziness, chest pain, or palpitations.  For hyperlipidemia, she takes atorvastatin  10 mg daily. Her cholesterol levels were last checked in February and were within normal limits.  She addresses prediabetes through diet.  Hard to exercise d/t chronic LBP.  She experiences intermittent leg swelling, particularly when she does not take Lasix  (furosemide ) daily. She manages hyponatremia and hypomagnesemia with magnesium  and vitamin D  supplementation.  No symptoms.  D/t SIADH  She has a history of asthma, managed with Spiriva , Advair, and albuterol  as needed. She receives Fasenra  injections every eight weeks and reports occasional use of albuterol , with some days requiring more frequent use.  She takes Prevacid  30 mg for reflux and has a history of laryngeal cancer, with her next ENT follow-up scheduled for December. No current issues with heartburn or throat symptoms.  She uses Myrbetriq  for overactive bladder, which she reports is effective.  She takes Xanax  0.5 mg and mirtazapine  7.5 mg for sleep, with no thoughts of suicide reported. She is  planning a trip with her daughter and granddaughter, indicating stable mental health.  She reports a history of sharp, intermittent jaw pain on the right side, which has not occurred in the past two weeks. The pain was described as a 'zap' lasting minutes, occurring intermittently over the past year, but not associated with chewing or other activities. resolved  No new blurry vision, loss of vision, bad headaches, dizziness, fever, or symptoms of low sodium. No recent chest pain, palpitations, or new shortness of breath. No recent vomiting, diarrhea, or heartburn.    Health Maintenance Due  Topic Date Due   INFLUENZA VACCINE  04/20/2024    Past Medical History:  Diagnosis Date   Allergy    Arthritis    Asthma    esosinophillic   Cancer (HCC)    vocal cord   Cataract    GERD (gastroesophageal reflux disease)    Hyperlipidemia    Hypertension    Hyponatremia    Osteoporosis    Spinal stenosis     Past Surgical History:  Procedure Laterality Date   BELPHAROPTOSIS REPAIR Bilateral 04/18/2024   FEMUR FRACTURE SURGERY Right 08/2016   HERNIA REPAIR Right    inguinal   JOINT REPLACEMENT  2017   bilateral knee   LAMINECTOMY  11/2020   L3-5   LARYNGOSCOPY  11/2021   SPINAL FUSION  03/2021     Current Outpatient Medications:    albuterol  (PROVENTIL  HFA) 108 (90 Base) MCG/ACT inhaler, Inhale 1-2 puffs into the lungs every 6 (six) hours as needed for wheezing or shortness of breath., Disp: 3 each, Rfl: 1   albuterol  (PROVENTIL ) (2.5 MG/3ML) 0.083% nebulizer  solution, Take 3 mLs (2.5 mg total) by nebulization every 6 (six) hours as needed for wheezing or shortness of breath., Disp: 75 mL, Rfl: 12   albuterol  (PROVENTIL ) (2.5 MG/3ML) 0.083% nebulizer solution, Take 3 mLs (2.5 mg total) by nebulization every 4 (four) hours as needed for wheezing or shortness of breath., Disp: 75 mL, Rfl: 0   ALPRAZolam  (XANAX ) 0.5 MG tablet, Take 1 tablet (0.5 mg total) by mouth at bedtime as needed  for anxiety., Disp: 90 tablet, Rfl: 1   amLODipine  (NORVASC ) 2.5 MG tablet, Take 1 tablet (2.5 mg total) by mouth daily., Disp: 90 tablet, Rfl: 3   atorvastatin  (LIPITOR) 10 MG tablet, Take 1 tablet (10 mg total) by mouth daily., Disp: 90 tablet, Rfl: 1   benralizumab  (FASENRA  PEN) 30 MG/ML prefilled autoinjector, Inject 1 mL (30 mg total) into the skin every 8 (eight) weeks., Disp: 1 mL, Rfl: 1   carvedilol  (COREG ) 25 MG tablet, Take 1 tablet (25 mg total) by mouth 2 (two) times daily with a meal., Disp: 180 tablet, Rfl: 3   Coenzyme Q10 (COQ-10 PO), Take 1 capsule by mouth daily., Disp: , Rfl:    Cyanocobalamin  (VITAMIN B 12 PO), Take 1 tablet by mouth daily., Disp: , Rfl:    fluticasone -salmeterol (ADVAIR) 250-50 MCG/ACT AEPB, Inhale 1 puff into the lungs in the morning and at bedtime., Disp: 180 each, Rfl: 3   furosemide  (LASIX ) 40 MG tablet, Take 1 tablet (40 mg total) by mouth daily., Disp: 90 tablet, Rfl: 1   Magnesium  Oxide (MAG-OXIDE PO), Take 1 tablet by mouth daily., Disp: , Rfl:    mirabegron  ER (MYRBETRIQ ) 50 MG TB24 tablet, Take 1 tablet (50 mg total) by mouth daily., Disp: 90 tablet, Rfl: 3   mirtazapine  (REMERON ) 7.5 MG tablet, Take 1 tablet (7.5 mg total) by mouth at bedtime., Disp: 90 tablet, Rfl: 3   PREVACID  30 MG capsule, Take 1 capsule (30 mg total) by mouth daily at 12 noon., Disp: 90 capsule, Rfl: 3   tiotropium (SPIRIVA ) 18 MCG inhalation capsule, Place 1 capsule (18 mcg total) into inhaler and inhale daily., Disp: 90 capsule, Rfl: 3   Vitamin D , Ergocalciferol , 50 MCG (2000 UT) CAPS, Take by mouth daily at 6 (six) AM., Disp: , Rfl:   No Known Allergies ROS neg/noncontributory except as noted HPI/below      Objective:     BP 125/75   Pulse 76   Temp 97.6 F (36.4 C) (Temporal)   Resp 18   Ht 4' 8 (1.422 m)   Wt 134 lb 6 oz (61 kg)   SpO2 97%   BMI 30.13 kg/m  Wt Readings from Last 3 Encounters:  05/02/24 134 lb 6 oz (61 kg)  03/22/24 132 lb (59.9 kg)   01/26/24 132 lb 4 oz (60 kg)    Physical Exam   Gen: WDWN NAD HEENT: NCAT, conjunctiva not injected, sclera nonicteric NECK:  supple, no thyromegaly, no nodes, no carotid bruits.  Neck firm from surgery CARDIAC: RRR, S1S2+, no murmur. DP 2+B LUNGS: CTAB. No wheezes ABDOMEN:  BS+, soft, NTND, No HSM, no masses EXT:  no edema MSK: no gross abnormalities.  NEURO: A&O x3.  CN II-XII intact.  PSYCH: normal mood. Good eye contact     Assessment & Plan:  Moderate persistent chronic asthma without complication  Bilateral leg edema  Athscl heart disease of native coronary artery w/o ang pctrs -     CBC with Differential/Platelet -  Comprehensive metabolic panel with GFR  Chronic anemia -     CBC with Differential/Platelet  Essential hypertension  Gastroesophageal reflux disease without esophagitis  High thyroid  stimulating hormone (TSH) level  History of vocal cord cancer (HCC)  Hypomagnesemia  SIADH (syndrome of inappropriate ADH production) (HCC) -     Comprehensive metabolic panel with GFR -     Magnesium   Prediabetes -     Hemoglobin A1c  Bilateral carotid artery stenosis -     US  Carotid Bilateral; Future  Other orders -     Albuterol  Sulfate HFA; Inhale 1-2 puffs into the lungs every 6 (six) hours as needed for wheezing or shortness of breath.  Dispense: 3 each; Refill: 1 -     Furosemide ; Take 1 tablet (40 mg total) by mouth daily.  Dispense: 90 tablet; Refill: 1  Assessment and Plan Assessment & Plan Hypertension   Blood pressure is well-controlled with readings in the 120s. She reports no dizziness, syncope, chest pain, or palpitations. Continue amlodipine  2.5 mg daily and carvedilol  25 mg twice daily.  Prediabetes   Management through diet and exercise is ongoing.  Hyperlipidemia   Cholesterol levels were last checked in February and were within an acceptable range. Continue atorvastatin  10 mg daily.  Hyponatremia   No symptoms such as dizziness  are reported. The condition is stable.  Hypomagnesemia   The condition is well-managed with current supplementation. Continue magnesium  supplementation and order blood work to monitor magnesium  levels.  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 She reports no new dyspnea or cough, and albuterol  usage varies daily. Continue Spiriva , Advair, and albuterol  as needed. Refill albuterol  inhaler and continue Fasenra  injections every 8 weeks.  Laryngeal cancer, status post treatment   No new symptoms are reported. Follow-up with ENT is scheduled for December.  Gastroesophageal reflux disease (GERD)   No symptoms of heartburn, vomiting, or diarrhea are reported. The condition is well-managed. Continue Prevacid  30 mg daily.  Overactive bladder   The condition is well-managed with current medication. Continue Myrbetriq .  Chronic lower extremity edema   Swelling is managed with Lasix , with occasional swelling when Lasix  is not taken due to being out. Continue Lasix  daily and refill the prescription.  Insomnia and Depression   Sleep is managed with Xanax  and mirtazapine . No suicidal ideation is reported. Continue Xanax  0.5 mg and mirtazapine  7.5 mg. Refill Xanax  prescription.  Bilateral blepharoplasty, recent   The procedure was performed to address vision impairment due to drooping eyelids. Follow-up with the surgeon for stitch removal.   H/o carotid stenosis.  Due for f/u duplex  Return in about 3 months (around 08/02/2024) for chronic follow-up.  Jenkins CHRISTELLA Carrel, MD

## 2024-05-03 ENCOUNTER — Ambulatory Visit: Payer: Medicare Other | Attending: Rheumatology | Admitting: Rheumatology

## 2024-05-03 ENCOUNTER — Encounter: Payer: Self-pay | Admitting: Rheumatology

## 2024-05-03 ENCOUNTER — Ambulatory Visit: Payer: Self-pay | Admitting: Family Medicine

## 2024-05-03 VITALS — BP 138/79 | HR 69 | Resp 14 | Ht <= 58 in | Wt 134.8 lb

## 2024-05-03 DIAGNOSIS — Z5181 Encounter for therapeutic drug level monitoring: Secondary | ICD-10-CM | POA: Diagnosis present

## 2024-05-03 DIAGNOSIS — F5101 Primary insomnia: Secondary | ICD-10-CM | POA: Insufficient documentation

## 2024-05-03 DIAGNOSIS — I10 Essential (primary) hypertension: Secondary | ICD-10-CM | POA: Diagnosis present

## 2024-05-03 DIAGNOSIS — K219 Gastro-esophageal reflux disease without esophagitis: Secondary | ICD-10-CM | POA: Diagnosis present

## 2024-05-03 DIAGNOSIS — M48062 Spinal stenosis, lumbar region with neurogenic claudication: Secondary | ICD-10-CM | POA: Diagnosis present

## 2024-05-03 DIAGNOSIS — M40294 Other kyphosis, thoracic region: Secondary | ICD-10-CM | POA: Insufficient documentation

## 2024-05-03 DIAGNOSIS — R29890 Loss of height: Secondary | ICD-10-CM | POA: Diagnosis present

## 2024-05-03 DIAGNOSIS — M19041 Primary osteoarthritis, right hand: Secondary | ICD-10-CM | POA: Insufficient documentation

## 2024-05-03 DIAGNOSIS — M810 Age-related osteoporosis without current pathological fracture: Secondary | ICD-10-CM | POA: Diagnosis present

## 2024-05-03 DIAGNOSIS — C320 Malignant neoplasm of glottis: Secondary | ICD-10-CM | POA: Insufficient documentation

## 2024-05-03 DIAGNOSIS — M19042 Primary osteoarthritis, left hand: Secondary | ICD-10-CM | POA: Insufficient documentation

## 2024-05-03 DIAGNOSIS — Z8619 Personal history of other infectious and parasitic diseases: Secondary | ICD-10-CM | POA: Diagnosis present

## 2024-05-03 DIAGNOSIS — Z96653 Presence of artificial knee joint, bilateral: Secondary | ICD-10-CM | POA: Diagnosis present

## 2024-05-03 DIAGNOSIS — J8283 Eosinophilic asthma: Secondary | ICD-10-CM | POA: Diagnosis present

## 2024-05-03 DIAGNOSIS — N3281 Overactive bladder: Secondary | ICD-10-CM | POA: Diagnosis present

## 2024-05-03 DIAGNOSIS — Z87891 Personal history of nicotine dependence: Secondary | ICD-10-CM | POA: Diagnosis present

## 2024-05-03 LAB — MAGNESIUM: Magnesium: 1.3 mg/dL — ABNORMAL LOW (ref 1.5–2.5)

## 2024-05-03 NOTE — Progress Notes (Signed)
 Labs are stable except magnesium  still low-take it twice/day repeat bmp,magnesium  few weeks or send copy if neph checks

## 2024-05-09 ENCOUNTER — Other Ambulatory Visit: Payer: Self-pay | Admitting: Family Medicine

## 2024-05-09 MED ORDER — TIOTROPIUM BROMIDE MONOHYDRATE 18 MCG IN CAPS: 18.0000 ug | ORAL_CAPSULE | Freq: Every day | RESPIRATORY_TRACT | 3 refills | Status: DC

## 2024-05-09 NOTE — Telephone Encounter (Signed)
 Copied from CRM #[id number]. Topic: Clinical - Medication Refill >> May 09, 2024 10:06 AM Nathanel DEL wrote: Medication: tiotropium (SPIRIVA ) 18 MCG inhalation capsule  Has the patient contacted their pharmacy? No (Agent: If no, request that the patient contact the pharmacy for the refill. If patient does not wish to contact the pharmacy document the reason why and proceed with request.) (Agent: If yes, when and what did the pharmacy advise?)  This is the patient's preferred pharmacy:  [facility] - [place], [place] - [facility] AT Portal to Registered [facility] Sites [address] [place] [address] Phone: [phone] Fax: [phone]  [facility] [place] [place] - [address] [place] [address] Phone: [phone] Fax: [phone]  Is this the correct pharmacy for this prescription? Yes If no, delete pharmacy and type the correct one.   Has the prescription been filled recently? Yes  Is the patient out of the medication? Yes  Has the patient been seen for an appointment in the last year OR does the patient have an upcoming appointment? Yes  Can we respond through MyChart? No  Pt states she looked where she keeps them and she has no more

## 2024-05-10 NOTE — Telephone Encounter (Signed)
 Pt called this am,and states she does not want the Spiriva  capsule.  She thinks the Spiriva  inhaler came from there dr in [place] .  The hadlots of samples and she stocked up. She would like the Brand name,not generic Spiriva  inhaler.  She insist it is not the capsule, but that is only spriva I see on her list. Please call w/ any questions or concerns.

## 2024-05-11 ENCOUNTER — Other Ambulatory Visit: Payer: Self-pay | Admitting: Family Medicine

## 2024-05-11 ENCOUNTER — Telehealth: Payer: Self-pay | Admitting: *Deleted

## 2024-05-11 MED ORDER — TIOTROPIUM BROMIDE MONOHYDRATE 18 MCG IN CAPS: 18.0000 ug | ORAL_CAPSULE | Freq: Every day | RESPIRATORY_TRACT | 3 refills | Status: DC

## 2024-05-11 NOTE — Telephone Encounter (Signed)
 Copied from CRM [id number]. Topic: Clinical - Prescription Issue >> May 11, 2024 11:08 AM Benton O wrote: Reason for CRM: patient is calling because she does not want the generic brand of tiotropium (SPIRIVA ) 18 MCG inhalation capsule patient says she wants the medication that says spiriva  brand name not the generic . Please refill the brand name and not the generic brand .  Patient say she will send back the generic brand . Please reach out to patient concerning this issue with the generic and brand name medication spiriva  [id number] >> May 11, 2024 11:16 AM Benton KIDD wrote: Sorry I thought this was for pulmonary . This is for primary . Do apologize getting patient over to primary now . Disregard crm that was sent to [facility]

## 2024-05-14 ENCOUNTER — Other Ambulatory Visit: Payer: Self-pay | Admitting: Family Medicine

## 2024-05-14 MED ORDER — MIRABEGRON ER 50 MG PO TB24: 50.0000 mg | ORAL_TABLET | Freq: Every day | ORAL | 3 refills | Status: AC

## 2024-05-14 MED ORDER — CARVEDILOL 25 MG PO TABS: 25.0000 mg | ORAL_TABLET | Freq: Two times a day (BID) | ORAL | 3 refills | Status: DC

## 2024-05-14 NOTE — Telephone Encounter (Signed)
 Copied from CRM #[id number]. Topic: Clinical - Medication Refill >> May 14, 2024 10:13 AM Corin V wrote: Medication: carvedilol  (COREG ) 25 MG tablet mirabegron  ER (MYRBETRIQ ) 50 MG TB24 tablet  Has the patient contacted their pharmacy? No- no refills remaining (Agent: If no, request that the patient contact the pharmacy for the refill. If patient does not wish to contact the pharmacy document the reason why and proceed with request.) (Agent: If yes, when and what did the pharmacy advise?)  This is the patient's preferred pharmacy:  [facility] - [place], [place] - One [facility] AT Portal to Registered Caremark Sites [address] [place] [address] Phone: [phone] Fax: [phone]  Is this the correct pharmacy for this prescription? Yes If no, delete pharmacy and type the correct one.   Has the prescription been filled recently? No  Is the patient out of the medication? Yes  Has the patient been seen for an appointment in the last year OR does the patient have an upcoming appointment? Yes  Can we respond through MyChart? Yes  Agent: Please be advised that Rx refills may take up to 3 business days. We ask that you follow-up with your pharmacy.

## 2024-05-18 ENCOUNTER — Ambulatory Visit: Payer: Self-pay | Admitting: Family Medicine

## 2024-05-18 ENCOUNTER — Other Ambulatory Visit: Payer: Self-pay | Admitting: *Deleted

## 2024-05-18 MED ORDER — CARVEDILOL 25 MG PO TABS
25.0000 mg | ORAL_TABLET | Freq: Two times a day (BID) | ORAL | 0 refills | Status: DC
Start: 2024-05-18 — End: 2024-08-02

## 2024-05-18 NOTE — Telephone Encounter (Signed)
Rx sent to the pharmacy as requested. ?

## 2024-05-18 NOTE — Telephone Encounter (Signed)
 Copied from CRM [id number]. Topic: Clinical - Medication Question >> May 18, 2024  9:53 AM Arm[place] J wrote: Reason for CRM: Bedlie calling from [facility] wanting to ask Dr Wendolyn if a temporary 15 day supply of carvedilol  (COREG ) 25 MG tablet could be sent to the patient's local pharmacy. They are refilling the medication for 90 days but medication shipment has been delayed.   [id number] [place], [place] - [address] [place]  [address] Phone: [phone] Fax: [phone] Hours: Not open 24 hours

## 2024-06-06 ENCOUNTER — Ambulatory Visit (HOSPITAL_BASED_OUTPATIENT_CLINIC_OR_DEPARTMENT_OTHER)
Admission: RE | Admit: 2024-06-06 | Discharge: 2024-06-06 | Disposition: A | Discharge status: Home / Self Care | Source: Ambulatory Visit | Attending: Family Medicine | Admitting: Family Medicine

## 2024-06-06 DIAGNOSIS — I6523 Occlusion and stenosis of bilateral carotid arteries: Secondary | ICD-10-CM | POA: Insufficient documentation

## 2024-06-07 ENCOUNTER — Ambulatory Visit (HOSPITAL_BASED_OUTPATIENT_CLINIC_OR_DEPARTMENT_OTHER)

## 2024-06-07 NOTE — Progress Notes (Signed)
 Less than 50% blockage.  No change to meds

## 2024-07-04 LAB — HM DEXA SCAN

## 2024-07-11 ENCOUNTER — Encounter: Payer: Self-pay | Admitting: Rheumatology

## 2024-07-11 ENCOUNTER — Ambulatory Visit: Attending: Rheumatology | Admitting: Rheumatology

## 2024-07-11 VITALS — BP 125/72 | HR 69 | Temp 97.5°F | Resp 13 | Ht <= 58 in | Wt 130.6 lb

## 2024-07-11 DIAGNOSIS — Z8619 Personal history of other infectious and parasitic diseases: Secondary | ICD-10-CM | POA: Diagnosis present

## 2024-07-11 DIAGNOSIS — C320 Malignant neoplasm of glottis: Secondary | ICD-10-CM | POA: Diagnosis present

## 2024-07-11 DIAGNOSIS — F5101 Primary insomnia: Secondary | ICD-10-CM | POA: Insufficient documentation

## 2024-07-11 DIAGNOSIS — M19042 Primary osteoarthritis, left hand: Secondary | ICD-10-CM | POA: Insufficient documentation

## 2024-07-11 DIAGNOSIS — Z5181 Encounter for therapeutic drug level monitoring: Secondary | ICD-10-CM | POA: Insufficient documentation

## 2024-07-11 DIAGNOSIS — K219 Gastro-esophageal reflux disease without esophagitis: Secondary | ICD-10-CM | POA: Diagnosis present

## 2024-07-11 DIAGNOSIS — R29890 Loss of height: Secondary | ICD-10-CM | POA: Insufficient documentation

## 2024-07-11 DIAGNOSIS — M19041 Primary osteoarthritis, right hand: Secondary | ICD-10-CM | POA: Insufficient documentation

## 2024-07-11 DIAGNOSIS — N3281 Overactive bladder: Secondary | ICD-10-CM | POA: Insufficient documentation

## 2024-07-11 DIAGNOSIS — Z87891 Personal history of nicotine dependence: Secondary | ICD-10-CM | POA: Insufficient documentation

## 2024-07-11 DIAGNOSIS — J8283 Eosinophilic asthma: Secondary | ICD-10-CM | POA: Diagnosis present

## 2024-07-11 DIAGNOSIS — I10 Essential (primary) hypertension: Secondary | ICD-10-CM | POA: Insufficient documentation

## 2024-07-11 DIAGNOSIS — M810 Age-related osteoporosis without current pathological fracture: Secondary | ICD-10-CM | POA: Insufficient documentation

## 2024-07-11 DIAGNOSIS — M48062 Spinal stenosis, lumbar region with neurogenic claudication: Secondary | ICD-10-CM | POA: Diagnosis present

## 2024-07-11 DIAGNOSIS — M546 Pain in thoracic spine: Secondary | ICD-10-CM | POA: Diagnosis present

## 2024-07-11 DIAGNOSIS — Z96653 Presence of artificial knee joint, bilateral: Secondary | ICD-10-CM | POA: Insufficient documentation

## 2024-07-11 DIAGNOSIS — M40294 Other kyphosis, thoracic region: Secondary | ICD-10-CM | POA: Insufficient documentation

## 2024-07-11 NOTE — Patient Instructions (Addendum)
 Please call us  if you'd like to move forward with Prolia before March 2027. We would need to update labs prior to this.  Denosumab Injection (Osteoporosis) What is this medication? DENOSUMAB (den oh SUE mab) prevents and treats osteoporosis. It works by Interior and spatial designer stronger and less likely to break (fracture). It is a monoclonal antibody. This medicine may be used for other purposes; ask your health care provider or pharmacist if you have questions. COMMON BRAND NAME(S): Prolia What should I tell my care team before I take this medication? They need to know if you have any of these conditions: Dental or gum disease Had thyroid  or parathyroid  (glands located in neck) surgery Having dental surgery or a tooth pulled Kidney disease Low levels of calcium  in the blood On dialysis Poor nutrition Thyroid  disease Trouble absorbing nutrients from your food An unusual or allergic reaction to denosumab, other medications, foods, dyes, or preservatives Pregnant or trying to get pregnant Breastfeeding How should I use this medication? This medication is injected under the skin. It is given by your care team in a hospital or clinic setting. A special MedGuide will be given to you before each treatment. Be sure to read this information carefully each time. Talk to your care team about the use of this medication in children. Special care may be needed. Overdosage: If you think you have taken too much of this medicine contact a poison control center or emergency room at once. NOTE: This medicine is only for you. Do not share this medicine with others. What if I miss a dose? Keep appointments for follow-up doses. It is important not to miss your dose. Call your care team if you are unable to keep an appointment. What may interact with this medication? Do not take this medication with any of the following: Other medications that contain denosumab This medication may also interact with the  following: Medications that lower your chance of fighting infection Steroid medications, such as prednisone  or cortisone This list may not describe all possible interactions. Give your health care provider a list of all the medicines, herbs, non-prescription drugs, or dietary supplements you use. Also tell them if you smoke, drink alcohol, or use illegal drugs. Some items may interact with your medicine. What should I watch for while using this medication? Your condition will be monitored carefully while you are receiving this medication. You may need blood work done while taking this medication. This medication may increase your risk of getting an infection. Call your care team for advice if you get a fever, chills, sore throat, or other symptoms of a cold or flu. Do not treat yourself. Try to avoid being around people who are sick. Tell your dentist and dental surgeon that you are taking this medication. You should not have major dental surgery while on this medication. See your dentist to have a dental exam and fix any dental problems before starting this medication. Take good care of your teeth while on this medication. Make sure you see your dentist for regular follow-up appointments. This medication may cause low levels of calcium  in your body. The risk of severe side effects is increased in people with kidney disease. Your care team may prescribe calcium  and vitamin D  to help prevent low calcium  levels while you take this medication. It is important to take calcium  and vitamin D  as directed by your care team. Talk to your care team if you may be pregnant. Serious birth defects may occur if you take  this medication during pregnancy and for 5 months after the last dose. You will need a negative pregnancy test before starting this medication. Contraception is recommended while taking this medication and for 5 months after the last dose. Your care team can help you find the option that works for  you. Talk to your care team before breastfeeding. Changes to your treatment plan may be needed. What side effects may I notice from receiving this medication? Side effects that you should report to your care team as soon as possible: Allergic reactions--skin rash, itching, hives, swelling of the face, lips, tongue, or throat Infection--fever, chills, cough, sore throat, wounds that don't heal, pain or trouble when passing urine, general feeling of discomfort or being unwell Low calcium  level--muscle pain or cramps, confusion, tingling, or numbness in the hands or feet Osteonecrosis of the jaw--pain, swelling, or redness in the mouth, numbness of the jaw, poor healing after dental work, unusual discharge from the mouth, visible bones in the mouth Severe bone, joint, or muscle pain Skin infection--skin redness, swelling, warmth, or pain Side effects that usually do not require medical attention (report these to your care team if they continue or are bothersome): Back pain Headache Joint pain Muscle pain Pain in the hands, arms, legs, or feet Runny or stuffy nose Sore throat This list may not describe all possible side effects. Call your doctor for medical advice about side effects. You may report side effects to FDA at 1-800-FDA-1088. Where should I keep my medication? This medication is given in a hospital or clinic. It will not be stored at home. NOTE: This sheet is a summary. It may not cover all possible information. If you have questions about this medicine, talk to your doctor, pharmacist, or health care provider.  2024 Elsevier/Gold Standard (2022-10-12 00:00:00)

## 2024-07-11 NOTE — Progress Notes (Signed)
 Office Visit Note  Patient: Julie Lutz             Date of Birth: [birth year]           MRN: [id number]             PCP: Wendolyn Jenkins Jansky, MD Referring: Wendolyn Jenkins Jansky, MD Visit Date: 07/11/2024 Occupation: Data Unavailable  Subjective:  Osteoporosis   History of Present Illness: Julie Lutz is a 83 y.o. female with osteoarthritis and osteoporosis.  She returns today after her last visit in August 2025 to discuss DEXA scan results.  Patient states she did well after the Reclast  infusion and has been taking calcium  and vitamin D .  She tries to be as active as possible.  She continues to have some discomfort in her lower back.  She plans to see the back specialist.    Activities of Daily Living:  Patient reports morning stiffness for 0 minutes.   Patient Reports nocturnal pain.  Difficulty dressing/grooming: Denies Difficulty climbing stairs: Denies Difficulty getting out of chair: Denies Difficulty using hands for taps, buttons, cutlery, and/or writing: Denies  Review of Systems  Constitutional:  Positive for fatigue.  HENT:  Negative for mouth sores and mouth dryness.   Eyes:  Positive for dryness.  Respiratory:  Negative for shortness of breath.   Cardiovascular:  Negative for chest pain and palpitations.  Gastrointestinal:  Negative for blood in stool, constipation and diarrhea.  Endocrine: Negative for increased urination.  Genitourinary:  Negative for involuntary urination.  Musculoskeletal:  Positive for joint pain, joint pain, myalgias and myalgias. Negative for gait problem, joint swelling, muscle weakness, morning stiffness and muscle tenderness.  Skin:  Negative for color change, rash, hair loss and sensitivity to sunlight.  Allergic/Immunologic: Negative for susceptible to infections.  Neurological:  Negative for dizziness and headaches.  Hematological:  Negative for swollen glands.  Psychiatric/Behavioral:  Negative for depressed mood and sleep disturbance.  The patient is not nervous/anxious.     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 04/27/2023   Hypomagnesemia 04/27/2023   Pure hypercholesterolemia 11/23/2022   Age-related osteoporosis without current pathological fracture 11/03/2022   Chronic anemia 10/26/2022   Essential hypertension 05/25/2022   Gastroesophageal reflux disease without esophagitis 05/25/2022   Localized osteoporosis without current pathological fracture 05/25/2022   Asthma, chronic 05/25/2022   History of vocal cord cancer (HCC) 05/25/2022   OAB (overactive bladder) 05/25/2022   Primary insomnia 05/25/2022   Lumbar back pain 02/11/2021   Lumbar facet arthropathy 10/07/2020   Lumbar degenerative disc disease 09/22/2020   Lumbar radiculitis 09/22/2020   Scoliosis of thoracolumbar spine 09/22/2020   Athscl heart disease of native coronary artery w/o ang pctrs 08/24/2020   Depression, unspecified 08/24/2020   History of falling 08/24/2020   Pain in left thigh 08/24/2020   Unspecified protein-calorie malnutrition 08/24/2020   Bilateral leg edema 06/04/2020   Hyperkalemia 11/01/2019   Stage 3a chronic kidney disease (HCC) 11/01/2019   Personal history of nicotine dependence 09/21/2019   Acute renal failure 10/04/2018   Complex renal cyst 04/19/2018  Past Medical History:  Diagnosis Date   Allergy    Arthritis    Asthma    esosinophillic   Cancer (HCC)    vocal cord   Cataract    GERD (gastroesophageal reflux disease)    Hyperlipidemia    Hypertension     Hyponatremia    Osteoporosis    Spinal stenosis     Family History  Problem Relation Age of Onset   Diabetes Sister    Cancer Sister 29       ovarian   Anemia Sister    Allergies Daughter    Asthma Neg Hx    Past Surgical History:  Procedure Laterality Date   BELPHAROPTOSIS REPAIR Bilateral 04/18/2024   FEMUR FRACTURE SURGERY Right 08/2016   HERNIA REPAIR Right    inguinal   JOINT REPLACEMENT  2017   bilateral knee   LAMINECTOMY  11/2020   L3-5   LARYNGOSCOPY  11/2021   SPINAL FUSION  03/2021   Social History   Tobacco Use   Smoking status: Former    Current packs/day: 0.00    Types: Cigarettes    Quit date: 1990    Years since quitting: 35.8    Passive exposure: Never   Smokeless tobacco: Never  Vaping Use   Vaping status: Never Used  Substance Use Topics   Alcohol use: Yes    Alcohol/week: 5.0 standard drinks of alcohol    Types: 5 Glasses of wine per week    Comment: occasional   Drug use: Never   Social History   Social History Narrative   2 grands   Retired Char[employer] fundraiser   Lives with her daughter/2025     Immunization History  Administered Date(s) Administered   Fluad Quad(high Dose 65+) 06/10/2022   Fluad Trivalent(High Dose 65+) 08/11/2023   Moderna Sars-Covid-2 Vaccination 10/17/2019, 11/14/2019   PNEUMOCOCCAL CONJUGATE-20 10/27/2023   Zoster Recombinant(Shingrix) 11/08/2023, 02/23/2024     Objective: Vital Signs: BP 125/72 (BP Location: Left Arm, Patient Position: Sitting, Cuff Size: Small)   Pulse 69   Temp (!) 97.5 F (36.4 C)   Resp 13   Ht 4' 8 (1.422 m)   Wt 130 lb 9.6 oz (59.2 kg)   BMI 29.28 kg/m    Physical Exam Vitals and nursing note reviewed.  Constitutional:      Appearance: She is well-developed.  HENT:     Head: Normocephalic and atraumatic.  Eyes:     Conjunctiva/sclera: Conjunctivae normal.  Cardiovascular:     Rate and Rhythm: Normal rate and regular rhythm.     Heart sounds: Normal heart sounds.  Pulmonary:      Effort: Pulmonary effort is normal.     Breath sounds: Normal breath sounds.  Abdominal:     General: Bowel sounds are normal.     Palpations: Abdomen is soft.  Musculoskeletal:     Cervical back: Normal range of motion.  Lymphadenopathy:     Cervical: No cervical adenopathy.  Skin:    General: Skin is warm and dry.     Capillary Refill: Capillary refill takes less than 2 seconds.  Neurological:     Mental Status: She is alert and oriented to person, place, and time.  Psychiatric:        Behavior: Behavior normal.      Musculoskeletal Exam: She had limited range of motion of the cervical spine.  Thoracic kyphosis was noted.  She had thoracolumbar scoliosis.  There was no point tenderness.  Shoulders, elbows, wrist joints  were in good range of motion.  She had bilateral CMC PIP and DIP thickening with no synovitis.  Hip joint could not be assessed in the seated position.  Knee joints were replaced and were in good range of motion.  She has no tenderness over her ankles or MTPs.  CDAI Exam: CDAI Score: -- Patient Global: --; Provider Global: -- Swollen: --; Tender: -- Joint Exam 07/11/2024   No joint exam has been documented for this visit   There is currently no information documented on the homunculus. Go to the Rheumatology activity and complete the homunculus joint exam.  Investigation: No additional findings.  Imaging: No results found.  Recent Labs: Lab Results  Component Value Date   WBC 7.3 05/02/2024   HGB 11.1 (L) 05/02/2024   PLT 353.0 05/02/2024   NA 131 (L) 05/02/2024   K 4.1 05/02/2024   CL 93 (L) 05/02/2024   CO2 31 05/02/2024   GLUCOSE 105 (H) 05/02/2024   BUN 14 05/02/2024   CREATININE 0.82 05/02/2024   BILITOT 0.4 05/02/2024   ALKPHOS 78 05/02/2024   AST 13 05/02/2024   ALT 8 05/02/2024   PROT 7.1 05/02/2024   ALBUMIN 4.0 05/02/2024   CALCIUM  9.1 05/02/2024   GFRAA >60 05/06/2018    July 04, 2024 T-score -2.5, BMD 0.540 right one third  distal radius  Speciality Comments: Reclast  infusion 2016, 2017, 2021, January 2023, February 2024, March 2025  Procedures:  No procedures performed Allergies: Patient has no known allergies.   Assessment / Plan:     Visit Diagnoses: Age-related osteoporosis without current pathological fracture - July 04, 2024 T-score -2.5, BMD 0.540 right one third distal radius. . Reclast  infusion in January 2023, February 2024 and the last one in March 2025.  She previously had Reclast  in 2016 and 2017.  After a drug holiday in 2021 and then in 2023.  The plan is to keep her on drug holiday until March 2027.  I discussed the option of repeating Reclast  in 2027 or switching to subcu Prolia or Evenity.  Patient was accompanied by her daughter Channing.  They decided to hold off on switching medication and would like to have repeat bone density in 2027.  She will continue calcium  and vitamin D  for now.  Medication monitoring encounter - Last Reclast  IV November 30, 2023  Height loss - 4 inch height loss.  Compression deformity of L1 with 15% height loss.  I will repeat x-rays of upper and lower back.  Other kyphosis of thoracic region  Primary osteoarthritis of both hands-she had bilateral PIP and DIP thickening and CMC prominence.  No synovitis was noted.  Joint protection was discussed.  Status post total knee replacement, bilateral-she good range of motion without discomfort.  Pain in thoracic spine-she continues to have some chronic discomfort in the thoracic region.  She has thoracic kyphosis and thoracolumbar scoliosis.  Will schedule x-rays of the thoracic spine.  Spinal stenosis of lumbar region with neurogenic claudication - Lumbar laminectomy March 2022, May 2022 and fusion July 2022 in [place].  I will schedule x-rays of the lumbar spine.  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  Primary insomnia  OAB (overactive bladder)  History of sepsis  Former smoker  Orders: Orders Placed This Encounter  Procedures   DG Lumbar  Spine 2-3 Views   DG Thoracic Spine 2 View   No orders of the defined types were placed in this encounter.    Follow-Up Instructions: Return in about 1 year (around 07/11/2025) for Osteoarthritis, Osteoporosis.   Maya Nash, MD  Note - This record has been created using Animal nutritionist.  Chart creation errors have been sought, but may not always  have been located. Such creation errors do not reflect on  the standard of medical care.

## 2024-07-11 NOTE — Progress Notes (Signed)
 Pharmacy Note  Subjective:  Patient presents today to [facility] for follow up office visit.  Patient was seen by the pharmacist for counseling on Prolia.  She has received Reclast  in 2016, 2017, 2021, Jan 2023, Feb 2024, then March 2025.  She does have history of femur fracture in December 2017  Objective: CMP     Component Value Date/Time   NA 131 (L) 05/02/2024 1415   NA 133 (L) 08/19/2022 1140   K 4.1 05/02/2024 1415   CL 93 (L) 05/02/2024 1415   CO2 31 05/02/2024 1415   GLUCOSE 105 (H) 05/02/2024 1415   BUN 14 05/02/2024 1415   BUN 17 08/19/2022 1140   CREATININE 0.82 05/02/2024 1415   CREATININE 1.09 (H) 11/08/2023 1547   CALCIUM  9.1 05/02/2024 1415   PROT 7.1 05/02/2024 1415   ALBUMIN 4.0 05/02/2024 1415   AST 13 05/02/2024 1415   ALT 8 05/02/2024 1415   ALKPHOS 78 05/02/2024 1415   BILITOT 0.4 05/02/2024 1415   GFRNONAA >60 01/19/2024 0526   GFRAA >60 05/06/2018 0436   Vitamin D  Lab Results  Component Value Date   VD25OH 47.88 10/27/2023   DEXA July 04, 2024 T-score -2.5, BMD 0.540 right one third distal radius Previous DEXA on 06/08/22 T-scrore -2.6, BMD 0.536 No significant changes  Assessment/Plan:   She is reluctant to start Prolia due to potentially having to go to the hospital for injections. She states that site of care for most recent Reclast  had no issues. She has history of radiation treatment so is not a candidate for PTH analogs.  Counseled patient on purpose, proper use, and adverse effects of Prolia.  Counseled patient that Prolia is a medication that must be injected every 6 months by a healthcare professional.  Advised patient to take calcium  1200 mg daily and vitamin D  800 units daily.  Reviewed the most common adverse effects of Prolia including risk of infection, osteonecrosis of the jaw, rash, and muscle/bone pain.  Patient confirms she does not have any major dental work planned at this time. She has dentures.  Reviewed with  patient the signs/symptoms of low calcium  and advised patient to alert us  if she experiences these symptoms.  Provided patient with medication education material and answered all questions.   Patient would like to repeat Reclast  in March 2027 after two-year drug holiday. Repeat DEXA in latter half of 2027 and determine from there if treatment does not need to be escalated.  Sherry Pennant, PharmD, MPH, BCPS, CPP Clinical Pharmacist [facility])

## 2024-07-25 ENCOUNTER — Other Ambulatory Visit: Payer: Self-pay | Admitting: Family Medicine

## 2024-07-25 ENCOUNTER — Telehealth: Payer: Self-pay

## 2024-07-25 MED ORDER — SPIRIVA RESPIMAT 1.25 MCG/ACT IN AERS: 2.0000 | INHALATION_SPRAY | Freq: Every day | RESPIRATORY_TRACT | 3 refills | Status: AC

## 2024-07-25 NOTE — Telephone Encounter (Signed)
 Copied from CRM [id number]. Topic: Clinical - Prescription Issue >> Jul 25, 2024  2:32 PM Rea C wrote: Reason for CRM: Patient called in and stated that Dr. Wendolyn sent over the wrong prescription, the wrong form of it. Dr. Wendolyn sent over spiriva  wrong form. Patient needs respimat 1.25 mcg activation . [facility] stated that [facility] stated that are going to [facility] a new refill request.   Patient stated that she needs it as soon as possible. She's been receiving the incorrect medication and patient needs a 3 month supply. Patient will appreciate if the correct medicine can be sent over respimat 1.25 mcg activation.   [facility] - [place], [place] - [address] [facility] AT Portal to Reg[facility] [address] [place] [address] Phone: [phone] Fax: [phone] Hours: Not open 24 hours   [phone] (M) Patient   Please Advise

## 2024-07-26 ENCOUNTER — Encounter: Payer: Self-pay | Admitting: Rheumatology

## 2024-07-26 NOTE — Telephone Encounter (Signed)
 Called pt left message for return call smk

## 2024-08-02 ENCOUNTER — Encounter: Payer: Self-pay | Admitting: Family Medicine

## 2024-08-02 ENCOUNTER — Ambulatory Visit (INDEPENDENT_AMBULATORY_CARE_PROVIDER_SITE_OTHER): Admitting: Family Medicine

## 2024-08-02 VITALS — BP 102/52 | HR 68 | Temp 97.5°F | Ht <= 58 in | Wt 132.2 lb

## 2024-08-02 DIAGNOSIS — Z23 Encounter for immunization: Secondary | ICD-10-CM | POA: Diagnosis not present

## 2024-08-02 DIAGNOSIS — J4550 Severe persistent asthma, uncomplicated: Secondary | ICD-10-CM | POA: Diagnosis not present

## 2024-08-02 DIAGNOSIS — R7303 Prediabetes: Secondary | ICD-10-CM | POA: Diagnosis not present

## 2024-08-02 DIAGNOSIS — I10 Essential (primary) hypertension: Secondary | ICD-10-CM

## 2024-08-02 DIAGNOSIS — C320 Malignant neoplasm of glottis: Secondary | ICD-10-CM

## 2024-08-02 DIAGNOSIS — F5101 Primary insomnia: Secondary | ICD-10-CM

## 2024-08-02 DIAGNOSIS — E7800 Pure hypercholesterolemia, unspecified: Secondary | ICD-10-CM

## 2024-08-02 DIAGNOSIS — K219 Gastro-esophageal reflux disease without esophagitis: Secondary | ICD-10-CM

## 2024-08-02 MED ORDER — PREVACID 30 MG PO CPDR: 30.0000 mg | DELAYED_RELEASE_CAPSULE | Freq: Every day | ORAL | 3 refills | Status: AC

## 2024-08-02 MED ORDER — ATORVASTATIN CALCIUM 10 MG PO TABS: 10.0000 mg | ORAL_TABLET | Freq: Every day | ORAL | 1 refills | Status: AC

## 2024-08-02 MED ORDER — CARVEDILOL 25 MG PO TABS: 25.0000 mg | ORAL_TABLET | Freq: Two times a day (BID) | ORAL | 3 refills | Status: AC

## 2024-08-02 MED ORDER — ALPRAZOLAM 0.5 MG PO TABS: 0.5000 mg | ORAL_TABLET | Freq: Every evening | ORAL | 1 refills | Status: AC | PRN

## 2024-08-02 MED ORDER — ALPRAZOLAM 0.5 MG PO TABS
0.5000 mg | ORAL_TABLET | Freq: Every evening | ORAL | 1 refills | Status: DC | PRN
Start: 2024-08-02 — End: 2024-08-02

## 2024-08-02 NOTE — Patient Instructions (Signed)

## 2024-08-02 NOTE — Progress Notes (Signed)
 Subjective:     Patient ID: Julie Lutz, female    DOB: [birth year], 83 y.o.   MRN: [id number]  Chief Complaint  Patient presents with   Medical Management of Chronic Issues   Follow-up    Here for a 3 month follow-up. Has not fasted if labs are needed.    Asthma    Astham- feels like it under control with meds.     Discussed the use of AI scribe software for clinical note transcription with the patient, who gave verbal consent to proceed.  History of Present Illness [name] is an 83 year old female with asthma and hypertension who presents for medication management and follow-up. Here w/daughter  She manages her asthma with Advair 250, Spiriva  Respimat, and Proventil , which she uses as a rescue inhaler and via nebulizer when ill.  For sleep, she takes Xanax  nightly and mirtazapine  7.5 mg at bedtime. Needs refill.  She manages her hypertension with carvedilol  25 mg twice daily, amlodipine  2.5mg . Home blood pressure readings are typically 120-130 mmHg systolic. She experienced a cold sweat after standing, with a blood pressure of 130/70 mmHg at that time.  She takes atorvastatin  for cholesterol management and is due for a refill. She plans to see her cardiologist next month.  Furosemide  is used for managing kidney-related sodium issues, initially prescribed by her cardiologist. Her nephrologist monitors her kidney function. Recent labs show a GFR of 54, BUN of 20, creatinine of 1.02, and sodium of 129.  She takes Prevacid  daily for stomach issues, which helps maintain her voice due to her history of vocal cord cancer. She follows up regularly with ENT.  No headaches, dizziness, chest pain, shortness of breath, vomiting, diarrhea, or leg swelling. She attributes a recent episode of feeling a cold sweat to her dog possibly cutting off circulation while sitting on her lap.    There are no preventive care reminders to display for this patient.   Past Medical History:   Diagnosis Date   Allergy    Arthritis    Asthma    esosinophillic   Cancer (HCC)    vocal cord   Cataract    GERD (gastroesophageal reflux disease)    Hyperlipidemia    Hypertension    Hyponatremia    Osteoporosis    Spinal stenosis     Past Surgical History:  Procedure Laterality Date   BELPHAROPTOSIS REPAIR Bilateral 04/18/2024   FEMUR FRACTURE SURGERY Right 08/2016   HERNIA REPAIR Right    inguinal   JOINT REPLACEMENT  2017   bilateral knee   LAMINECTOMY  11/2020   L3-5   LARYNGOSCOPY  11/2021   SPINAL FUSION  03/2021     Current Outpatient Medications:    albuterol  (PROVENTIL  HFA) 108 (90 Base) MCG/ACT inhaler, Inhale 1-2 puffs into the lungs every 6 (six) hours as needed for wheezing or shortness of breath., Disp: 3 each, Rfl: 1   albuterol  (PROVENTIL ) (2.5 MG/3ML) 0.083% nebulizer solution, Take 3 mLs (2.5 mg total) by nebulization every 6 (six) hours as needed for wheezing or shortness of breath., Disp: 75 mL, Rfl: 12   amLODipine  (NORVASC ) 2.5 MG tablet, Take 1 tablet (2.5 mg total) by mouth daily., Disp: 90 tablet, Rfl: 3   benralizumab  (FASENRA  PEN) 30 MG/ML prefilled autoinjector, Inject 1 mL (30 mg total) into the skin every 8 (eight) weeks., Disp: 1 mL, Rfl: 1   Coenzyme Q10 (COQ-10 PO), Take 1 capsule by mouth daily., Disp: , Rfl:  Cyanocobalamin  (VITAMIN B 12 PO), Take 1 tablet by mouth daily., Disp: , Rfl:    fluticasone -salmeterol (ADVAIR) 250-50 MCG/ACT AEPB, Inhale 1 puff into the lungs in the morning and at bedtime., Disp: 180 each, Rfl: 3   furosemide  (LASIX ) 40 MG tablet, Take 1 tablet (40 mg total) by mouth daily., Disp: 90 tablet, Rfl: 1   Magnesium  Oxide (MAG-OXIDE PO), Take 1 tablet by mouth daily., Disp: , Rfl:    mirabegron  ER (MYRBETRIQ ) 50 MG TB24 tablet, Take 1 tablet (50 mg total) by mouth daily., Disp: 90 tablet, Rfl: 3   mirtazapine  (REMERON ) 7.5 MG tablet, Take 1 tablet (7.5 mg total) by mouth at bedtime., Disp: 90 tablet, Rfl: 3    Tiotropium Bromide  (SPIRIVA  RESPIMAT) 1.25 MCG/ACT AERS, Inhale 2 puffs into the lungs daily., Disp: 12 g, Rfl: 3   ALPRAZolam  (XANAX ) 0.5 MG tablet, Take 1 tablet (0.5 mg total) by mouth at bedtime as needed for anxiety., Disp: 90 tablet, Rfl: 1   atorvastatin  (LIPITOR) 10 MG tablet, Take 1 tablet (10 mg total) by mouth daily., Disp: 90 tablet, Rfl: 1   carvedilol  (COREG ) 25 MG tablet, Take 1 tablet (25 mg total) by mouth 2 (two) times daily with a meal., Disp: 180 tablet, Rfl: 3   PREVACID  30 MG capsule, Take 1 capsule (30 mg total) by mouth daily at 12 noon., Disp: 90 capsule, Rfl: 3   Vitamin D , Ergocalciferol , 50 MCG (2000 UT) CAPS, Take by mouth daily at 6 (six) AM., Disp: , Rfl:   No Known Allergies ROS neg/noncontributory except as noted HPI/below      Objective:     BP (!) 102/52   Pulse 68   Temp (!) 97.5 F (36.4 C) (Temporal)   Ht 4' 8 (1.422 m)   Wt 132 lb 3.2 oz (60 kg)   SpO2 95%   BMI 29.64 kg/m  Wt Readings from Last 3 Encounters:  08/02/24 132 lb 3.2 oz (60 kg)  07/11/24 130 lb 9.6 oz (59.2 kg)  05/03/24 134 lb 12.8 oz (61.1 kg)    Physical Exam VITALS: BP- 102/52 MEASUREMENTS: Weight- 72 pounds. GENERAL: Well developed, well nourished, no acute distress. HEAD EYES EARS NOSE THROAT: Normocephalic, atraumatic, conjunctiva not injected, sclera nonicteric. CARDIAC: Regular rate and rhythm, S1 S2 present, no murmur, dorsalis pedis 2 plus bilaterally. NECK: chronic firmness R neck, no thyromegaly, no nodes, no carotid bruits. LUNGS: Clear to auscultation bilaterally, no wheezes. ABDOMEN: Bowel sounds present, soft, non-tender, non-distended, no hepatosplenomegaly, no masses. EXTREMITIES: No edema. MUSCULOSKELETAL: No gross abnormalities. walker NEUROLOGICAL: Alert and oriented x3, cranial nerves II through XII intact. PSYCHIATRIC: Normal mood, good eye contact.       Assessment & Plan:  Immunization due -     Flu vaccine HIGH DOSE PF(Fluzone  Trivalent)  Severe persistent asthma without complication (HCC)  Essential hypertension -     Carvedilol ; Take 1 tablet (25 mg total) by mouth 2 (two) times daily with a meal.  Dispense: 180 tablet; Refill: 3  Hypomagnesemia  Prediabetes  Pure hypercholesterolemia -     Atorvastatin  Calcium ; Take 1 tablet (10 mg total) by mouth daily.  Dispense: 90 tablet; Refill: 1  Gastroesophageal reflux disease without esophagitis -     Prevacid ; Take 1 capsule (30 mg total) by mouth daily at 12 noon.  Dispense: 90 capsule; Refill: 3  History of vocal cord cancer (HCC)  Primary insomnia -     ALPRAZolam ; Take 1 tablet (0.5 mg total) by mouth  at bedtime as needed for anxiety.  Dispense: 90 tablet; Refill: 1    Assessment and Plan Assessment & Plan Encounter for immunization   She received a tetanus booster due to her granddaughter-in-law's pregnancy.  Severe persistent asthma   Her asthma is well-controlled with Advair, Spiriva , and Proventil . She uses a nebulizer only during illness. Continue Advair, Spiriva , and Proventil  as prescribed.  Essential hypertension   Her blood pressure is generally well-controlled with carvedilol  25 mg twice daily. She experiences occasional low readings without symptoms of dizziness or hypotension. Continue carvedilol  25 mg twice daily and amlodipine  2.5mg  and monitor blood pressure at home.  Hypomagnesemia   Her magnesium  level is 1.6, within normal range. Continue current management.  Pure hypercholesterolemia   She is on atorvastatin  for cholesterol management and is due for a refill. Sent atorvastatin  refill to pharmacy.  Gastroesophageal reflux disease   Her GERD is managed with Prevacid , maintaining good control of symptoms. Sent Prevacid  refill to pharmacy.  History of malignant neoplasm of glottis   She has regular follow-ups with ENT and reports no current issues. Continue regular ENT follow-up.  Insomnia   Her insomnia is managed with Xanax   and mirtazapine . She reports no suicidal ideation. Refilled Xanax  prescription at local pharmacy and continue mirtazapine  7.5 mg at bedtime.     Return in about 3 months (around 11/02/2024) for chronic follow-up.  Jenkins CHRISTELLA Carrel, MD

## 2024-08-20 NOTE — Progress Notes (Signed)
  Cardiology Office Note:  .    Date:  08/22/2024  ID:  Julie Lutz, DOB June 30, 1941, MRN [id number] PCP: Wendolyn Jenkins Jansky, MD  [facility] Providers Cardiologist:  Shelda Bruckner, MD     History of Present Illness: .    Julie Lutz is a 83 y.o. female with a hx of chronic hyponatremia, hypertension, hyperlipidemia, asthma, anemia, vocal cord cancer. She is formerly a patient of Dr. Hobart, last seen by her 02/03/2023. She established care with me 06/2023  Today: Hospitalized 01/2024 with pneumonia 2/2 covid. Hyponatremia at that time worse than baseline, managed by nephrology during the admission.  Overall has been doing well the last few months. Has labs followed regularly by Dr. Wendolyn and Dr. Gearline. Leg edema has been well controlled. Watches her weight daily, has been stable. Only had one episode several months ago when Dr. Gearline told her to take extra lasix  for a few days.  ROS:  Denies chest pain, shortness of breath at rest or with normal exertion. No PND, orthopnea, LE edema or unexpected weight gain. No syncope or palpitations. ROS otherwise negative except as noted.   Studies Reviewed: SABRA         Physical Exam:    VS:  BP 116/64 (BP Location: Right Arm, Patient Position: Sitting, Cuff Size: Normal)   Pulse 60   Ht 4' 8 (1.422 m)   Wt 132 lb (59.9 kg)   BMI 29.59 kg/m    Wt Readings from Last 3 Encounters:  08/22/24 132 lb (59.9 kg)  08/02/24 132 lb 3.2 oz (60 kg)  07/11/24 130 lb 9.6 oz (59.2 kg)    GEN: Well nourished, well developed in no acute distress HEENT: Normal, moist mucous membranes NECK: No JVD CARDIAC: regular rhythm, normal S1 and S2, no rubs or gallops. No murmur. VASCULAR: Radial and DP pulses 2+ bilaterally. No carotid bruits RESPIRATORY:  Clear to auscultation without rales, wheezing or rhonchi  ABDOMEN: Soft, non-tender, non-distended MUSCULOSKELETAL:  Ambulates independently SKIN: Warm and dry, no significant LE edema  today NEUROLOGIC:  Alert and oriented x 3. No focal neuro deficits noted. PSYCHIATRIC:  Normal affect   ASSESSMENT AND PLAN: .    Hypertension -continue amlodipine , furosemide , carvedilol    Chronic LE edema, well controlled -continue furosemide  -on low dose amlodipine    Hyperlipidemia Mild carotid plaque without significant stenosis -continue atorvastatin  10 mg daily   Chronic anemia -followed by PCP, denies active bleeding  Chronic hyponatremia -other than hospitalization earlier this year, has been stable -avoid increasing diuretic if possible  Dispo: Follow-up in 1 year, or sooner as needed.  Signed, Shelda Bruckner, MD

## 2024-08-22 ENCOUNTER — Ambulatory Visit (INDEPENDENT_AMBULATORY_CARE_PROVIDER_SITE_OTHER): Admitting: Cardiology

## 2024-08-22 ENCOUNTER — Encounter: Payer: Self-pay | Admitting: Internal Medicine

## 2024-08-22 ENCOUNTER — Ambulatory Visit: Admitting: Internal Medicine

## 2024-08-22 ENCOUNTER — Encounter (HOSPITAL_BASED_OUTPATIENT_CLINIC_OR_DEPARTMENT_OTHER): Payer: Self-pay | Admitting: Cardiology

## 2024-08-22 VITALS — BP 120/60 | HR 64 | Temp 98.9°F | Ht <= 58 in | Wt 134.4 lb

## 2024-08-22 VITALS — BP 116/64 | HR 60 | Ht <= 58 in | Wt 132.0 lb

## 2024-08-22 DIAGNOSIS — I779 Disorder of arteries and arterioles, unspecified: Secondary | ICD-10-CM

## 2024-08-22 DIAGNOSIS — K219 Gastro-esophageal reflux disease without esophagitis: Secondary | ICD-10-CM | POA: Diagnosis not present

## 2024-08-22 DIAGNOSIS — E7800 Pure hypercholesterolemia, unspecified: Secondary | ICD-10-CM | POA: Diagnosis not present

## 2024-08-22 DIAGNOSIS — R490 Dysphonia: Secondary | ICD-10-CM

## 2024-08-22 DIAGNOSIS — Z87898 Personal history of other specified conditions: Secondary | ICD-10-CM | POA: Diagnosis not present

## 2024-08-22 DIAGNOSIS — E871 Hypo-osmolality and hyponatremia: Secondary | ICD-10-CM

## 2024-08-22 DIAGNOSIS — J4550 Severe persistent asthma, uncomplicated: Secondary | ICD-10-CM | POA: Diagnosis not present

## 2024-08-22 DIAGNOSIS — I10 Essential (primary) hypertension: Secondary | ICD-10-CM

## 2024-08-22 NOTE — Patient Instructions (Signed)
 It was a pleasure to see you today!  Please schedule follow up with Dr Kassie in 6 months. Please call sooner [phone] if issues or concerns arise. You can also send us  a message through MyChart, but but aware that this is not to be used for urgent issues and it may take up to 5-7 days to receive a reply. Please be aware that you will likely be able to view your results before I have a chance to respond to them. Please give us  5 business days to respond to any non-urgent results.    Glad you are doing well.  Continue your current medications: advair, spiriva , fasenra , albuterol  as needed.

## 2024-08-22 NOTE — Progress Notes (Signed)
 Julie Lutz    [id number]    07-14-1941  Primary Care Physician:Kulik, Jenkins Jansky, MD Date of Appointment: 08/22/2024 Established Patient Visit  Chief complaint:   Chief Complaint  Patient presents with   Asthma    Breathing is doing baseline since last OV. Occ SOB with exertion and non prod cough.      HPI: Julie Lutz is a 83 y.o. 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 Has been on fasenra  since about 2020.  Interval Updates: Here for follow up.  Last seen a year ago. Was hospitalized for 4 days for pneumonia in the spring.  No interval prednisone  use aside from this hospital stay.   Faithful to medication. She really feels benefit from the spiriva  and has been unable to taper off.   Uses albuterol  prn about 2-3 times/week.   No issues with fasenra  injections  Current Regimen: advair 250 1 puff twice a day, spiriva  once daily 1 puff daily, fasenra  Asthma Triggers: URIs, exertion Exacerbations in the last year: none in the last year.  History of hospitalization or intubation:never Allergy Testing: yes  GERD: yes on prevacid  daily.  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 Not taking any nasal sprays.  ACT:  Asthma Control Test ACT Total Score  02/10/2023  1:22 PM 19  07/08/2022  1:09 PM 21    I have reviewed the patient's family social and past medical history and updated as appropriate.   Past Medical History:  Diagnosis Date   Allergy    Arthritis    Asthma    esosinophillic   Cancer (HCC)    vocal cord   Cataract    GERD (gastroesophageal reflux disease)    Hyperlipidemia    Hypertension    Hyponatremia    Osteoporosis    Spinal stenosis     Past Surgical History:  Procedure Laterality Date   BELPHAROPTOSIS REPAIR Bilateral 04/18/2024   FEMUR FRACTURE SURGERY Right 08/2016   HERNIA REPAIR Right    inguinal   JOINT REPLACEMENT  2017   bilateral knee   LAMINECTOMY   11/2020   L3-5   LARYNGOSCOPY  11/2021   SPINAL FUSION  03/2021    Family History  Problem Relation Age of Onset   Diabetes Sister    Cancer Sister 14       ovarian   Anemia Sister    Allergies Daughter    Asthma Neg Hx     Social History   Occupational History   Occupation: RETIRED  Tobacco Use   Smoking status: Former    Current packs/day: 0.00    Types: Cigarettes    Quit date: 1990    Years since quitting: 35.9    Passive exposure: Never   Smokeless tobacco: Never  Vaping Use   Vaping status: Never Used  Substance and Sexual Activity   Alcohol use: Yes    Alcohol/week: 5.0 standard drinks of alcohol    Types: 5 Glasses of wine per week    Comment: occasional   Drug use: Never   Sexual activity: Not Currently     Physical Exam: Blood pressure 120/60, pulse 64, temperature 98.9 F (37.2 C), temperature source Oral, height 4' 8 (1.422 m), weight 134 lb 6.4 oz (61 kg), SpO2 97%.  Gen:      No distress, ambulating with walker ENT: mmm Lungs:   kyphosis, breathing non labored, no wheeze CV:  RRR no mrg   Data Reviewed: Imaging: I have personally reviewed the chest xray July 2024 shows hyperinflation  PFTs:      No data to display         I have personally reviewed the patient's PFTs and   Labs: Lab Results  Component Value Date   NA 131 (L) 05/02/2024   K 4.1 05/02/2024   CO2 31 05/02/2024   GLUCOSE 105 (H) 05/02/2024   BUN 14 05/02/2024   CREATININE 0.82 05/02/2024   CALCIUM  9.1 05/02/2024   EGFR 54 (L) 08/19/2022   GFRNONAA >60 01/19/2024   Lab Results  Component Value Date   WBC 7.3 05/02/2024   HGB 11.1 (L) 05/02/2024   HCT 34.2 (L) 05/02/2024   MCV 90.7 05/02/2024   PLT 353.0 05/02/2024  Eosinophils 400 in August 2019  Immunization status: Immunization History  Administered Date(s) Administered   Fluad Quad(high Dose 65+) 06/10/2022   Fluad Trivalent(High Dose 65+) 08/11/2023   INFLUENZA, HIGH DOSE SEASONAL PF  08/02/2024   Moderna Sars-Covid-2 Vaccination 10/17/2019, 11/14/2019   PNEUMOCOCCAL CONJUGATE-20 10/27/2023   Zoster Recombinant(Shingrix) 11/08/2023, 02/23/2024    External Records Personally Reviewed:   Assessment:  Severe persistent eosinophilic asthma, on benralizumab , controlled GERD, controlled Chronic rhinitis, controlled 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   Continue your current medications: advair, spiriva , fasenra , albuterol  as needed.   Continue prevacid    Return to Care: Return in about 6 months (around 02/20/2025) for Dr Kassie.   Verdon Gore, MD Pulmonary and Critical Care Medicine [facility] [phone]

## 2024-08-22 NOTE — Patient Instructions (Signed)
 Medication Instructions:  No changes *If you need a refill on your cardiac medications before your next appointment, please call your pharmacy*  Lab Work: none If you have labs (blood work) drawn today and your tests are completely normal, you will receive your results only by: MyChart Message (if you have MyChart) OR A paper copy in the mail If you have any lab test that is abnormal or we need to change your treatment, we will call you to review the results.  Testing/Procedures: none  Follow-Up: At [facility], you and your health needs are our priority.  As part of our continuing mission to provide you with exceptional heart care, our providers are all part of one team.  This team includes your primary Cardiologist (physician) and Advanced Practice Providers or APPs (Physician Assistants and Nurse Practitioners) who all work together to provide you with the care you need, when you need it.  Your next appointment:   12 month(s)  Provider:   Shelda Bruckner, MD, Rosaline Bane, NP, or Reche Finder, NP

## 2024-09-11 ENCOUNTER — Other Ambulatory Visit: Payer: Self-pay | Admitting: Internal Medicine

## 2024-09-11 DIAGNOSIS — J4550 Severe persistent asthma, uncomplicated: Secondary | ICD-10-CM

## 2024-09-11 NOTE — Telephone Encounter (Signed)
 Fasenra  Rx refilled - sent to [facility].  Last OV 08/22/24 with Dr. Meade, next OV due June 2026 with Dr. Kassie.

## 2024-10-02 ENCOUNTER — Telehealth: Payer: Self-pay

## 2024-10-02 DIAGNOSIS — M810 Age-related osteoporosis without current pathological fracture: Secondary | ICD-10-CM

## 2024-10-02 DIAGNOSIS — Z5181 Encounter for therapeutic drug level monitoring: Secondary | ICD-10-CM

## 2024-10-02 NOTE — Telephone Encounter (Signed)
 Pt contacted office stating that she has decided to move forward with Prolia injections. She is aware that she will need to have updated labs and she has been advised to be on the lookout for someone to call her back regarding next steps. Will route to clinical team to have lab orders placed.

## 2024-10-02 NOTE — Telephone Encounter (Signed)
 Lab Orders placed

## 2024-11-06 ENCOUNTER — Ambulatory Visit: Admitting: Rheumatology

## 2024-11-06 DIAGNOSIS — J8283 Eosinophilic asthma: Secondary | ICD-10-CM

## 2024-11-06 DIAGNOSIS — Z5181 Encounter for therapeutic drug level monitoring: Secondary | ICD-10-CM

## 2024-11-06 DIAGNOSIS — C320 Malignant neoplasm of glottis: Secondary | ICD-10-CM

## 2024-11-06 DIAGNOSIS — M810 Age-related osteoporosis without current pathological fracture: Secondary | ICD-10-CM

## 2024-11-06 DIAGNOSIS — I10 Essential (primary) hypertension: Secondary | ICD-10-CM

## 2024-11-06 DIAGNOSIS — Z96653 Presence of artificial knee joint, bilateral: Secondary | ICD-10-CM

## 2024-11-06 DIAGNOSIS — N3281 Overactive bladder: Secondary | ICD-10-CM

## 2024-11-06 DIAGNOSIS — Z8619 Personal history of other infectious and parasitic diseases: Secondary | ICD-10-CM

## 2024-11-06 DIAGNOSIS — R29890 Loss of height: Secondary | ICD-10-CM

## 2024-11-06 DIAGNOSIS — M48062 Spinal stenosis, lumbar region with neurogenic claudication: Secondary | ICD-10-CM

## 2024-11-06 DIAGNOSIS — K219 Gastro-esophageal reflux disease without esophagitis: Secondary | ICD-10-CM

## 2024-11-06 DIAGNOSIS — M40294 Other kyphosis, thoracic region: Secondary | ICD-10-CM

## 2024-11-06 DIAGNOSIS — F5101 Primary insomnia: Secondary | ICD-10-CM

## 2024-11-06 DIAGNOSIS — M546 Pain in thoracic spine: Secondary | ICD-10-CM

## 2024-11-06 DIAGNOSIS — Z87891 Personal history of nicotine dependence: Secondary | ICD-10-CM

## 2024-11-06 DIAGNOSIS — M19042 Primary osteoarthritis, left hand: Secondary | ICD-10-CM

## 2024-11-08 ENCOUNTER — Ambulatory Visit: Admitting: Family Medicine

## 2025-03-15 ENCOUNTER — Encounter (HOSPITAL_BASED_OUTPATIENT_CLINIC_OR_DEPARTMENT_OTHER): Admitting: Pulmonary Disease

## 2025-03-27 ENCOUNTER — Ambulatory Visit
